# Patient Record
Sex: Female | Born: 1960 | Race: White | Hispanic: No | Marital: Single | Smoking: Current some day smoker
Health system: Southern US, Community
[De-identification: ages and names within clinical notes are randomized; demographics above are authoritative.]

## PROBLEM LIST (undated history)

## (undated) DIAGNOSIS — IMO0001 Reserved for inherently not codable concepts without codable children: Secondary | ICD-10-CM

## (undated) DIAGNOSIS — G40909 Epilepsy, unspecified, not intractable, without status epilepticus: Secondary | ICD-10-CM

## (undated) DIAGNOSIS — N201 Calculus of ureter: Secondary | ICD-10-CM

## (undated) DIAGNOSIS — Z87440 Personal history of urinary (tract) infections: Secondary | ICD-10-CM

## (undated) DIAGNOSIS — Z87448 Personal history of other diseases of urinary system: Secondary | ICD-10-CM

## (undated) DIAGNOSIS — E785 Hyperlipidemia, unspecified: Secondary | ICD-10-CM

## (undated) DIAGNOSIS — Z87442 Personal history of urinary calculi: Secondary | ICD-10-CM

## (undated) DIAGNOSIS — G40409 Other generalized epilepsy and epileptic syndromes, not intractable, without status epilepticus: Secondary | ICD-10-CM

## (undated) DIAGNOSIS — K219 Gastro-esophageal reflux disease without esophagitis: Secondary | ICD-10-CM

## (undated) HISTORY — PX: TONSILLECTOMY AND ADENOIDECTOMY: SUR1326

## (undated) HISTORY — DX: Gastro-esophageal reflux disease without esophagitis: K21.9

## (undated) HISTORY — DX: Hyperlipidemia, unspecified: E78.5

## (undated) HISTORY — PX: APPENDECTOMY: SHX54

---
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
## 2000-03-16 ENCOUNTER — Other Ambulatory Visit: Admission: RE | Admit: 2000-03-16 | Discharge: 2000-03-16 | Payer: Self-pay | Admitting: Family Medicine

## 2001-05-02 ENCOUNTER — Other Ambulatory Visit: Admission: RE | Admit: 2001-05-02 | Discharge: 2001-05-02 | Payer: Self-pay | Admitting: Family Medicine

## 2002-05-08 ENCOUNTER — Other Ambulatory Visit: Admission: RE | Admit: 2002-05-08 | Discharge: 2002-05-08 | Payer: Self-pay | Admitting: Family Medicine

## 2003-06-13 ENCOUNTER — Other Ambulatory Visit: Admission: RE | Admit: 2003-06-13 | Discharge: 2003-06-13 | Payer: Self-pay | Admitting: Family Medicine

## 2004-07-30 ENCOUNTER — Ambulatory Visit (HOSPITAL_COMMUNITY): Admission: RE | Admit: 2004-07-30 | Discharge: 2004-07-30 | Payer: Self-pay | Admitting: Obstetrics

## 2005-02-04 ENCOUNTER — Ambulatory Visit (HOSPITAL_COMMUNITY): Admission: RE | Admit: 2005-02-04 | Discharge: 2005-02-04 | Payer: Self-pay | Admitting: Obstetrics

## 2007-09-06 ENCOUNTER — Inpatient Hospital Stay (HOSPITAL_COMMUNITY): Admission: EM | Admit: 2007-09-06 | Discharge: 2007-09-11 | Payer: Self-pay | Admitting: Emergency Medicine

## 2007-09-10 ENCOUNTER — Ambulatory Visit: Payer: Self-pay | Admitting: Gastroenterology

## 2007-09-28 ENCOUNTER — Ambulatory Visit (HOSPITAL_COMMUNITY): Admission: RE | Admit: 2007-09-28 | Discharge: 2007-09-28 | Payer: Self-pay | Admitting: Family Medicine

## 2007-11-19 ENCOUNTER — Ambulatory Visit: Payer: Self-pay | Admitting: Gastroenterology

## 2007-11-21 ENCOUNTER — Ambulatory Visit: Payer: Self-pay | Admitting: Gastroenterology

## 2008-09-07 DIAGNOSIS — D649 Anemia, unspecified: Secondary | ICD-10-CM

## 2009-05-16 ENCOUNTER — Emergency Department (HOSPITAL_COMMUNITY): Admission: EM | Admit: 2009-05-16 | Discharge: 2009-05-16 | Payer: Self-pay | Admitting: Emergency Medicine

## 2009-07-20 ENCOUNTER — Ambulatory Visit (HOSPITAL_COMMUNITY): Admission: RE | Admit: 2009-07-20 | Discharge: 2009-07-20 | Payer: Self-pay | Admitting: Family Medicine

## 2009-09-15 ENCOUNTER — Ambulatory Visit: Payer: Self-pay | Admitting: Gastroenterology

## 2009-09-15 DIAGNOSIS — R197 Diarrhea, unspecified: Secondary | ICD-10-CM

## 2009-09-15 LAB — CONVERTED CEMR LAB
ALT: 15 units/L (ref 0–35)
AST: 20 units/L (ref 0–37)
Albumin: 4.1 g/dL (ref 3.5–5.2)
Alkaline Phosphatase: 66 units/L (ref 39–117)
BUN: 3 mg/dL — ABNORMAL LOW (ref 6–23)
Basophils Absolute: 0 10*3/uL (ref 0.0–0.1)
Basophils Relative: 0.7 % (ref 0.0–3.0)
CO2: 27 meq/L (ref 19–32)
CRP, High Sensitivity: 0.5 (ref 0.00–5.00)
Calcium: 9 mg/dL (ref 8.4–10.5)
Chloride: 101 meq/L (ref 96–112)
Creatinine, Ser: 0.5 mg/dL (ref 0.4–1.2)
Eosinophils Absolute: 0 10*3/uL (ref 0.0–0.7)
Eosinophils Relative: 0.7 % (ref 0.0–5.0)
GFR calc non Af Amer: 139.59 mL/min (ref >60)
Glucose, Bld: 90 mg/dL (ref 70–99)
HCT: 39.6 % (ref 36.0–46.0)
Hemoglobin: 13.6 g/dL (ref 12.0–15.0)
Lymphocytes Relative: 56.1 % — ABNORMAL HIGH (ref 12.0–46.0)
Lymphs Abs: 2.2 10*3/uL (ref 0.7–4.0)
MCHC: 34.4 g/dL (ref 30.0–36.0)
MCV: 103.5 fL — ABNORMAL HIGH (ref 78.0–100.0)
Monocytes Absolute: 0.3 10*3/uL (ref 0.1–1.0)
Monocytes Relative: 9 % (ref 3.0–12.0)
Neutro Abs: 1.3 10*3/uL — ABNORMAL LOW (ref 1.4–7.7)
Neutrophils Relative %: 33.5 % — ABNORMAL LOW (ref 43.0–77.0)
Platelets: 237 10*3/uL (ref 150.0–400.0)
Potassium: 4.1 meq/L (ref 3.5–5.1)
RBC: 3.82 M/uL — ABNORMAL LOW (ref 3.87–5.11)
RDW: 12.9 % (ref 11.5–14.6)
Sodium: 136 meq/L (ref 135–145)
Tissue Transglutaminase Ab, IgA: 0.1 units (ref <7)
Total Bilirubin: 0.6 mg/dL (ref 0.3–1.2)
Total Protein: 6.9 g/dL (ref 6.0–8.3)
WBC: 3.8 10*3/uL — ABNORMAL LOW (ref 4.5–10.5)

## 2009-09-18 ENCOUNTER — Ambulatory Visit: Payer: Self-pay | Admitting: Gastroenterology

## 2009-09-24 ENCOUNTER — Encounter: Payer: Self-pay | Admitting: Gastroenterology

## 2009-10-19 ENCOUNTER — Ambulatory Visit: Payer: Self-pay | Admitting: Gastroenterology

## 2009-10-19 DIAGNOSIS — K219 Gastro-esophageal reflux disease without esophagitis: Secondary | ICD-10-CM

## 2009-11-30 ENCOUNTER — Ambulatory Visit: Payer: Self-pay | Admitting: Gastroenterology

## 2010-11-07 ENCOUNTER — Encounter: Payer: Self-pay | Admitting: Family Medicine

## 2010-11-07 ENCOUNTER — Encounter: Payer: Self-pay | Admitting: Internal Medicine

## 2010-11-16 NOTE — Assessment & Plan Note (Signed)
Summary: f,.u after procedure ////em   History of Present Illness Visit Type: follow up  Primary GI MD: Melvia Heaps MD [facility] Primary Provider: Rudi Heap, MD Requesting Provider: n/a Chief Complaint: F/u from procedures  History of Present Illness:   Ms. Zapanta has returned for follow up of her abdominal pain and diarrhea.  Colonoscopy was normal.  Celiac antibodies were absent.  Random biopsies of the colon did not demonstrate any abnormalities.  With dietary discretion her symptoms have improved.  Clearly certain foods will trigger GI problems including diarrhea and abdominal pain.  She also has reflux which is fairly well controlled with Nexium.   GI Review of Systems      Denies abdominal pain, acid reflux, belching, bloating, chest pain, dysphagia with liquids, dysphagia with solids, heartburn, loss of appetite, nausea, vomiting, vomiting blood, weight loss, and  weight gain.        Denies anal fissure, black tarry stools, change in bowel habit, constipation, diarrhea, diverticulosis, fecal incontinence, heme positive stool, hemorrhoids, irritable bowel syndrome, jaundice, light color stool, liver problems, rectal bleeding, and  rectal pain.    Current Medications (verified): 1)  Depakote 500 Mg Tbec (Divalproex Sodium) .... Take 2 in The Morning, Take One At [facility] and 2 in The Evening. 2)  Tegretol 200 Mg Tabs (Carbamazepine) .... Take One and Half Tablet in The Mornings, Take One Tablet At [facility] and Take One and Half Tablet in The Evenings. 3)  Nexium 40 Mg Cpdr (Esomeprazole Magnesium) .... Once Daily 4)  Zonisamide 100 Mg Caps (Zonisamide) .... Once Daily  Allergies (verified): 1)  ! * Topomax 2)  ! * Lamisil  Past History:  Past Medical History: Reviewed history from 09/15/2009 and no changes required. Hyperlipidemia Kidney Stones Seizures  Past Surgical History: Reviewed history from 09/07/2008 and no changes required. APPENDECTOMY CERVICAL  FUSION CHOLECYSTECTOMY BILATERAL TUBAL LIGATION UNILATERAL OOPHORECTOMY  Family History: Family History of Prostate Cancer:GF Family History of Diabetes: Mother Family History of Heart Disease: Mother No FH of Colon Cancer:  Social History: Reviewed history from 09/15/2009 and no changes required. Occupation: Disabled Patient currently smokes.  Alcohol Use - yes Daily Caffeine Use -3 Illicit Drug Use - no  Review of Systems  The patient denies allergy/sinus, anemia, anxiety-new, arthritis/joint pain, back pain, blood in urine, breast changes/lumps, change in vision, confusion, cough, coughing up blood, depression-new, fainting, fatigue, fever, headaches-new, hearing problems, heart murmur, heart rhythm changes, itching, menstrual pain, muscle pains/cramps, night sweats, nosebleeds, pregnancy symptoms, shortness of breath, skin rash, sleeping problems, sore throat, swelling of feet/legs, swollen lymph glands, thirst - excessive , urination - excessive , urination changes/pain, urine leakage, vision changes, and voice change.    Vital Signs:  Patient profile:   50 year old female Height:      65 inches Weight:      153 pounds BMI:     25.55 BSA:     1.77 Pulse rate:   72 / minute Pulse rhythm:   regular BP sitting:   122 / 80  (left arm) Cuff size:   regular  Vitals Entered By: Ok Anis CMA (October 19, 2009 3:09 PM)   Impression & Recommendations:  Problem # 1:  DIARRHEA (ICD-787.91) Symptoms are most likely due to diarrhea-predominant IBS.  Medications may be contributing.  Recommendations #1 hyomax p.r.n. for pain #2 fiber supplementation #3 patient was instructed to take a dietary history when she has particularly symptomatic days  Problem # 2:  ESOPHAGEAL REFLUX (ICD-530.81)  Symptoms are well-controlled Nexium.  Plan to continue with the same.  Patient Instructions: 1)  CC Dr. Vernon Prey Prescriptions: HYOMAX-SL 0.125 MG SUBL (HYOSCYAMINE SULFATE) take 2  tabs sublingual q.4 h. p.r.n. abdominal pain  #15 x 2   Entered and Authorized by:   Louis Meckel MD   Signed by:   Louis Meckel MD on 10/19/2009   Method used:   Electronically to        [facility] [phone]* (retail)       [address]       [place], [place]  [address]       Ph: [phone] or [phone]       Fax: [phone]   RxID:   [id number]

## 2010-11-16 NOTE — Assessment & Plan Note (Signed)
Summary: F/U APPT...LSW.   History of Present Illness Visit Type: Follow-up Visit Primary GI MD: Melvia Heaps MD [facility] Primary Provider: Rudi Heap, MD Requesting Provider: n/a Chief Complaint: follow up diarrhea and abdominal pain, pt states she is feeling better.  pt is using Beano with fiber to reduce bloating form fiber History of Present Illness:   Ms. Covell has returned for followup of her diarrhea.  On a regimen of fiber and beano her symptoms are well-controlled.  There is certain foods that triggers symptoms including mayonnaise and salads.  With dietary discretion her symptoms are well-controlled.   GI Review of Systems      Denies abdominal pain, acid reflux, belching, bloating, chest pain, dysphagia with liquids, dysphagia with solids, heartburn, loss of appetite, nausea, vomiting, vomiting blood, weight loss, and  weight gain.        Denies anal fissure, black tarry stools, change in bowel habit, constipation, diarrhea, diverticulosis, fecal incontinence, heme positive stool, hemorrhoids, irritable bowel syndrome, jaundice, light color stool, liver problems, rectal bleeding, and  rectal pain.    Current Medications (verified): 1)  Depakote 500 Mg Tbec (Divalproex Sodium) .... Take 2 in The Morning, Take One At [facility] and 2 in The Evening. 2)  Tegretol 200 Mg Tabs (Carbamazepine) .... Take One and Half Tablet in The Mornings, Take One Tablet At [facility] and Take One and Half Tablet in The Evenings. 3)  Nexium 40 Mg Cpdr (Esomeprazole Magnesium) .... Once Daily 4)  Zonisamide 100 Mg Caps (Zonisamide) .... Once Daily 5)  Hyomax-Sl 0.125 Mg Subl (Hyoscyamine Sulfate) .... Take 2 Tabs Under [place] Every 4 Hours As Needed For Abdominal Pain 6)  Beano  Tabs (Alpha-D-Galactosidase) .... Take With Meals 7)  Metamucil 0.52 Gm Caps (Psyllium) .... Take 1 Capsule By Mouth Two Times A Day 8)  Simvastatin 40 Mg Tabs (Simvastatin) .... Take 1 Tablet By Mouth Once A Day 9)  Fish Oil 1000 Mg  Caps (Omega-3 Fatty Acids) .... Take 1 Cap With Meals 10)  Red Yeast Rice 600 Mg Tabs (Red Yeast Rice Extract) .... Take 21 Tablet With Meals 11)  Flax Seed Oil 1000 Mg Caps (Flaxseed (Linseed)) .... Take 1 Cap With Meals 12)  Vitamin D 1000 Unit Tabs (Cholecalciferol) .... Take 1 Tablet By Mouth Once A Day  Allergies: 1)  ! Topamax (Topiramate) 2)  ! Lamisil (Terbinafine Hcl)  Past History:  Past Medical History: Hyperlipidemia Kidney Stones Seizures GERD  Past Surgical History: Reviewed history from 09/07/2008 and no changes required. APPENDECTOMY CERVICAL FUSION CHOLECYSTECTOMY BILATERAL TUBAL LIGATION UNILATERAL OOPHORECTOMY  Family History: Reviewed history from 10/19/2009 and no changes required. Family History of Prostate Cancer:GF Family History of Diabetes: Mother Family History of Heart Disease: Mother No FH of Colon Cancer:  Social History: Reviewed history from 09/15/2009 and no changes required. Occupation: Disabled Patient currently smokes.  Alcohol Use - yes Daily Caffeine Use -3 Illicit Drug Use - no  Review of Systems  The patient denies allergy/sinus, anemia, anxiety-new, arthritis/joint pain, back pain, blood in urine, breast changes/lumps, confusion, cough, coughing up blood, depression-new, fainting, fatigue, fever, headaches-new, hearing problems, heart murmur, heart rhythm changes, itching, menstrual pain, muscle pains/cramps, night sweats, nosebleeds, pregnancy symptoms, shortness of breath, skin rash, sleeping problems, sore throat, swelling of feet/legs, swollen lymph glands, thirst - excessive, urination - excessive, urination changes/pain, urine leakage, vision changes, and voice change.    Vital Signs:  Patient profile:   50 year old female Height:  65 inches Weight:      153 pounds BMI:     25.55 Pulse rate:   76 / minute Pulse rhythm:   regular BP sitting:   110 / 70  (left arm) Cuff size:   regular  Vitals Entered By:  Francee Piccolo CMA Duncan Dull) (November 30, 2009 10:02 AM)   Impression & Recommendations:  Problem # 1:  DIARRHEA (ICD-787.91) Assessment Improved The patient has IBS and also a mouth chest or foods.  Recommendations #1 continue fiber supplementation and beano while watching her diet  Patient Instructions: 1)  CC Dr. Vernon Prey

## 2010-11-26 ENCOUNTER — Other Ambulatory Visit: Payer: Self-pay | Admitting: Family Medicine

## 2010-11-26 DIAGNOSIS — L989 Disorder of the skin and subcutaneous tissue, unspecified: Secondary | ICD-10-CM

## 2010-12-01 ENCOUNTER — Ambulatory Visit (HOSPITAL_COMMUNITY)
Admission: RE | Admit: 2010-12-01 | Discharge: 2010-12-01 | Disposition: A | Discharge status: Home / Self Care | Payer: Medicaid Other | Source: Ambulatory Visit | Attending: Family Medicine | Admitting: Family Medicine

## 2010-12-01 DIAGNOSIS — R221 Localized swelling, mass and lump, neck: Secondary | ICD-10-CM | POA: Insufficient documentation

## 2010-12-01 DIAGNOSIS — R220 Localized swelling, mass and lump, head: Secondary | ICD-10-CM | POA: Insufficient documentation

## 2010-12-01 DIAGNOSIS — D170 Benign lipomatous neoplasm of skin and subcutaneous tissue of head, face and neck: Secondary | ICD-10-CM | POA: Insufficient documentation

## 2010-12-01 DIAGNOSIS — L989 Disorder of the skin and subcutaneous tissue, unspecified: Secondary | ICD-10-CM

## 2011-03-01 NOTE — Discharge Summary (Signed)
NAME:  Savannah Shaw, Savannah Shaw                  ACCOUNT NO.:  [id number]   MEDICAL RECORD NO.:  [id number]          PATIENT TYPE:  INP   LOCATION:  A328                          FACILITY:  [facility]   PHYSICIAN:  Marcello Moores, MD   DATE OF BIRTH:  Feb 27, 1961   DATE OF ADMISSION:  09/06/2007  DATE OF DISCHARGE:  11/25/2008LH                               DISCHARGE SUMMARY   PRIMARY CARE PHYSICIAN:  Dr. Gennette Pac at [facility].   DISCHARGE DIAGNOSES:  1. Pyelonephritis/nephritis infectious versus noninfectious,      resolving.  Needs follow up by CAT scan in 1 month.  2. Seizure disorder.  No episode of seizure during admission.  She      will have followup with Dr. Gerilyn Pilgrim in 1 month.  3. Anemia, unknown origin.  Stool guaiac is negative.  She was      evaluated by gastrointestinal and she can have outpatient followup      with Dr. Cira Servant and Dr. Jena Gauss.  4. Altered mental status, resolved probably false.  5. Small left lower pole of left kidney urolithiasis.  Probably will      be removed spontaneously and it will be reaccessed with CAT scan of      the kidney in 1 month.  This plan was detailed with the patient as      was her niece which was here.   DISCHARGE MEDICATIONS:  1. Carbamazepine 300 mg in a.m., 300 mg at p.m. and 200 mg at noon.  2. Zonisamide 100 mg daily to be tapered to 50 mg and stopped later on      by Dr. Gerilyn Pilgrim.  3. Depakote 1000 mg in a.m. and 1000 mg in p.m. and 500 mg at noon.  4. Darvocet 100/650 every 6 hours p.r.n.  5. Levaquin 500 mg p.o. day x7 days.   LABORATORY DATA AND X-RAY FINDINGS:  CAT scan of the abdomen and pelvic  which showed enlarged and edematous kidneys with a few small stones in  the lower pole of the left kidney.  No sign of obstruction and the  finding was consistent with nephritis, infectious versus noninfectious.  A small amount of fluid in the pelvis and small density in the right  side of the pelvis as well.   Ultrasound of the kidneys showed left  kidney larger than right, but there is no hydronephrosis or mass.  CT of  the head without contrast was negative.  LP was done by neurologist and  it was negative for any meningitis.   CONSULTATIONS:  1. Neurologist for altered mental status and fever and seizures.  2. Gastroenterology for abdominal pain and anemia.   HOSPITAL COURSE:  Savannah Shaw is a 50 year old female patient with long  history of seizure disorder on three high dose antiepileptic  medications.  She presented with fever and urinary symptoms and was  admitted for pyelonephritis and she was put on Rocephin and she has  fever and history of questionable seizure episodes and altered mental  status, to some extent confusion.  Neurologist was consulted and  LP was  done which was negative for meningitis.  Her seizure was managed as  before and she remained very stable and she responded well to Rocephin.  Fever subsided, leukocytosis also subsided and there is no pain.  She  has low hemoglobin/hematocrit and the stool guaiac is negative.  She was  evaluated by GI and she needs to have followup with them in 1 month.   DISCHARGE PHYSICAL EXAMINATION:  VITAL SIGNS:  Temperature 97, pulse is  67 and respiratory rate 20, systolic blood pressure 110 and diastolics  is 68.  CHEST:  Good air entry.  NECK:  Supple.  CARDIAC: S1, S2 and regular.  ABDOMEN:  Soft.  EXTREMITIES:  No pedal edema.  CNS:  She is alert and fairly oriented.   DISCHARGE LABORATORY DATA AND X-RAY FINDINGS:  White blood cells 4.9,  hemoglobin 8.8 and hematocrit 26, platelet count 222.  Chemistry within  normal range with sodium 140, potassium 3.7, BUN and creatinine also  within normal range.  Stool guaiac is negative x3 and the third one is  pending.  Iron studies with iron 36, total iron binding capacity is 155.  Ferritin level is 202.  Folic acid was within normal range and vitamin  B12 also within normal range.    DISPOSITION:  She will be discharged today to home.   FOLLOW UP:  She needs to have followup with her PMD in 1 week.  Followup  CAT scan of the pelvic area, specifically kidneys to see the type of  nephritis whether it has resolved or not and to see the stone and  whether it has spontaneously removed.  She needs followup with GI and  needs her hemoglobin to be monitored by her PMD and needs further  evaluation with GI as well.      Marcello Moores, MD  Electronically Signed     MT/MEDQ  D:  09/11/2007  T:  09/11/2007  Job:  [id number]   cc:   Gennette Pac, M.D.  [facility]

## 2011-03-01 NOTE — H&P (Signed)
NAME:  Savannah Shaw, Savannah Shaw                  ACCOUNT NO.:  [id number]   MEDICAL RECORD NO.:  [id number]          PATIENT TYPE:  INP   LOCATION:  A322                          FACILITY:  [facility]   PHYSICIAN:  Marcello Moores, MD   DATE OF BIRTH:  07/25/1961   DATE OF ADMISSION:  09/06/2007  DATE OF DISCHARGE:  LH                              HISTORY & PHYSICAL   PMD:  She goes to [facility] at [place].   CHIEF COMPLAINT:  Fever, flank pain, nausea and questionable seizure of  three days' duration.   HISTORY OF PRESENT ILLNESS:  Ms. Kiesel is 50 year old female patient with  history of seizure disorder for long time for several years.  She was  brought by EMS from [facility] for the above complaint.  As per the patient  and the patient's boyfriend stated that she had flank pain and fever 3  days ago and she went to her primary physician and she was given  antibiotic and went back home and she felt a little bit better but she  started to have fever and she became a little bit confused and he  suspects that she had episode of seizure two nights, but he was not sure  will because he found her once in the bathroom confused and second he  saw her she was shaking.   Dictation ended at this point.      Marcello Moores, MD  Electronically Signed     MT/MEDQ  D:  09/06/2007  T:  09/07/2007  Job:  [id number]

## 2011-03-01 NOTE — H&P (Signed)
NAME:  Savannah Shaw, Savannah Shaw                  ACCOUNT NO.:  [id number]   MEDICAL RECORD NO.:  [id number]          PATIENT TYPE:  INP   LOCATION:  A328                          FACILITY:  [facility]   PHYSICIAN:  Marcello Moores, MD   DATE OF BIRTH:  [birth year]   DATE OF ADMISSION:  09/06/2007  DATE OF DISCHARGE:  LH                              HISTORY & PHYSICAL   PRIMARY CARE PHYSICIAN:  [facility].   CHIEF COMPLAINT:  Fever, flank pain and nausea with questionable seizure  of three days' duration.   HISTORY OF PRESENT ILLNESS:  She is 50 year old female patient with  history of seizure disorder for several years.  She is brought today by  EMS with the above complaints as per the patient's boyfriend who is at  her bedside.  The patient stated that she had fever and flank pain 3  days ago and she visited her physician at [facility].  She received some  antibiotic and she was feeling a little bit better to have again fever,  flank pain, with nausea and loss of appetite.  The patient and her  boyfriend stated that they think she passed a stone in the last 2 days  and he feels that she had episodes of seizure also two nights as per the  boyfriend.  He stated that once he found her in the bathroom confused  and the second night he saw her shaking and he thinks that was her  seizure.  The patient stated that she was taking her medications  properly and the boyfriend feels that she is more confused also the last  few days.  She denied any headache.  Her flank pain is bilateral and she  admitted to some frequency and she was told by her PMD she has urinary  tract infection; otherwise, she has no cough, no chest pain or any  palpitations. And she was starting to have watery diarrhea the last few  hours as well.   REVIEW OF SYSTEMS:  The 10-point review of systems is as dictated in the  HPI.   ALLERGIES:  No known drug allergies.   SOCIAL HISTORY:  The patient is living currently  with her boyfriend and  she is not working.  She is on did disability for her seizure related.  She denies smoking or any alcohol use.   FAMILY HISTORY:  Noncontributory.   PAST MEDICAL HISTORY:  Seizure disorder.   HOME MEDICATIONS:  1. Depakote 500 mg 2 tablets in a.m. and 500 mg 1 tablet at noon and      500 mg 2 tablets in p.m.  2. Naprosyn 500 mg twice daily.  3. Darvocet 100/650 mg every 6 hours p.r.n.  4. Zonisamide 100 mg twice daily.  5. Carbamazepine 200 mg 1-1/2 tablet in a.m. and 1 tablet at noon 1-      1/2 tablet at p.m.  6. Nitrofurantoin 100 mg twice daily for the last 3 days.  7. Loperamide 2 mg p.r.n.   PHYSICAL EXAMINATION:  GENERAL:  The patient is lying on the bed without  any cardiopulmonary distress but she wants to go to bathroom for a  moment.  VITAL SIGNS:  Temperature in the emergency room was 100.5,  blood pressure 112/67, pulse is 100, respiratory rate 18, saturation  100%.  HEENT:  She has pink conjunctivae.  Nonicteric sclerae.  Pupils are  equal and reactive.  NECK:  Supple.  There are no any signs of meningeal irritation.  CHEST:  She has good air entry bilaterally.  CARDIOVASCULAR:  S1 and S2 well-heard,  irregular.  ABDOMEN:  Soft.  Some mild tenderness on the flank area bilaterally and  hyperactive bowel sounds.  EXTREMITIES:  She has no pedal edema.  CNS:  She is alert but slow to respond and trying to search for words.  I am not sure of her baseline.   LABORATORY DATA:  White blood cells 15, hemoglobin is 11.9, hematocrit  35.  Platelet count 101.  Chemistry shows sodium is 133, potassium is  3.9, chloride is 102, glucose 122, BUN 15, creatinine is 0.7.  Urinalysis showed white blood cells 11-20; otherwise negative.  Valproic  acid level was 28 and carbamazepine level was 9.4   ASSESSMENT:  1. Urinary tract infection with possible pyelonephritis.  The patient      has fever and flank pain bilaterally and sign of urinary tract       infection.  She was on nitrofurantoin.  Will put her on Rocephin IV      and will send urine culture as well as blood culture and will do      ultrasound of the kidney.  2. Altered mental status with confusion, questionable seizure      episodes.  Will restart her seizure medications as it was and will      monitor for seizure.  Currently CAT scan of the brain without      contrast is negative for any mass or any abnormality.  Will consult      also neurologist for further management.  3. Acute diarrhea.  Will give her IV fluid and we will send stool exam      for Clostridium difficile also.  Will put her on DVT as well as GI      prophylaxis.      Marcello Moores, MD  Electronically Signed     MT/MEDQ  D:  09/06/2007  T:  09/07/2007  Job:  [id number]

## 2011-03-01 NOTE — Assessment & Plan Note (Signed)
NAME:  Savannah Shaw, Savannah Shaw                   CHART#:  [id number]   DATE:  11/19/2007                       DOB:  [birth year]   CHIEF COMPLAINT:  Followup normocytic anemia.   SUBJECTIVE:  The patient is a 50 year old female who was admitted to  [facility] and we were consulted on 09/10/2007.  She was  admitted with nephritis and mental status changes.  She was found to be  anemic.  Her hemoglobin went from 11.2 down to 8.7.  She had a normal  folate, a low iron at 36, a TIBC of 159% saturation, UIBC 123, ferritin  202 and B12 447.  She was fecal occult blood test negative x2.  She  denies any abdominal pain since discharge, she denies any fatigue.  She  says that overall she is doing very well.  She denies any nausea,  vomiting, heartburn, indigestion, dysphagia, odynophagia, anorexia or  early satiety, denies any rectal bleeding or melena.   CURRENT MEDICATIONS:  See the updated list from 11/19/2007.   ALLERGIES:  No known drug allergies.   OBJECTIVE:  VITAL SIGNS: Weight 150 pounds, height 65 inches, temp 98.5,  blood pressure 120/82 and pulse 72.  GENERAL:  The patient is a well-developed, well-nourished Caucasian  female in no acute distress.HEENT:  Sclerae clear, non injected.  Conjunctivae pink.  Oropharynx pink and moist without any lesions.CHEST:  Heart regular rate and rhythm with normal S1, S2 without murmurs,  clicks, rubs, or gallops.ABDOMEN:  Positive bowel sounds x4.  No bruits  auscultated, soft, nontender, nondistended without palpable masses or  hepatosplenomegaly.  No rebound, tenderness or guarding.EXTREMITIES:  Without clubbing or edema.   ASSESSMENT:  The patient is a 50 year old female with anemia with  hospitalization approximately 6 weeks ago.  There is no evidence of iron  deficiency.  No gastrointestinal  complaints at this time.  Would check a CBC and Hemoccult stools x3.  Cannot rule out early iron deficiency, but will follow up with CBC and  determine  further workup pending results.       Lorenza Burton, N.P.  Electronically Signed     Kassie Mends, M.D.  Electronically Signed    KJ/MEDQ  D:  11/19/2007  T:  11/20/2007  Job:  [id number]

## 2011-03-01 NOTE — Consult Note (Signed)
NAME:  Savannah Shaw, BREWTON                  ACCOUNT NO.:  [id number]   MEDICAL RECORD NO.:  [id number]          PATIENT TYPE:  INP   LOCATION:  A328                          FACILITY:  [facility]   PHYSICIAN:  Kofi A. Gerilyn Pilgrim, M.D. DATE OF BIRTH:  1961/01/16   DATE OF CONSULTATION:  DATE OF DISCHARGE:                                 CONSULTATION   REASON FOR CONSULTATION:  Altered mental status.   The patient is a 50 year old white female who presents with abdominal  pain, not feeling well over the last few days.  She believes she  apparently passed a kidney stone and went to see her doctor who gave her  medication for this.  She has had significant anorexia and drowsiness,  apparently sleeping for most of the day before presenting to the  hospital.  She had 2 convulsive seizures and a severe headache which  resulted in the patient coming to the hospital.  Again, she also had  ulceration of mentation.  She has a baseline history of seizures but has  been seizure free for close to 3 years on her current anti-epileptic  medications.  She apparently has had seizures since the age of 60.  There is no family history of seizures.  She reports being weaned off  her medication because of her seizures were well controlled but  apparently had a seizure at that time which was about 3 years ago.  She  has been on her current medications without any seizures.  The patient  may not have been taking her medication over the last couple of days  because of feeling sick including having significant nausea and loose  bowels.  Again, she had a severe headache yesterday and reports not being a  headache person at baseline.  She continues to have significant  abdominal pain involving the groin region, the right and left lower  quadrant regions, and also the right flank region.  She does not report  having a headache today.  She also complains of urinary frequency, dark-  colored urine, and dysuria.   PAST MEDICAL  HISTORY:  Significant for epilepsy.   PAST SURGICAL HISTORY:  1. Appendectomy.  2. Cervical fusion.  3. Cholecystectomy.   SOCIAL HISTORY:  Not employed.  No history of tobacco or illicit drug  use.  She has a supportive aunt.  She has 2 children.   ALLERGIES:  None known.   ADMISSION MEDICATIONS:  1. Carbamazepine 200 mg, one half b.i.d. and one at noon.  2. Naproxen 500 mg p.r.n.  3. Nitrofurantoin 100 mg b.i.d.  4. Aciphex.  5. Generic Depakote 500 mg, two b.i.d. and one at noon.  6. Zonisamide 100 mg b.i.d.   FAMILY HISTORY:  No history of seizures.   PHYSICAL EXAMINATION:  GENERAL:  Shows a very pleasant lady in no acute  distress.  VITAL SIGNS:  T-max was 103.9, latest temperature 98, pulse 99,  respirations 24, blood pressure 87/54.  HEENT:  Neck is supple.  Head is atraumatic, normocephalic.  I see no  evidence of meningismus.  Negative Kernig sign.  ABDOMEN:  Soft.  EXTREMITIES:  No significant edema.  She does have some varicosities.  NEUROLOGIC:  Mentation:  She is awake and alert.  She converses well.  Speech, language, and cognition are intact.  Cranial nerves evaluation:  Pupils are 4-mm and reactive.  Visual fields are intact.  Extraocular  movements are intact.  Facial muscle strength is symmetric.  Tongue is  midline.  Uvula midline.  Shoulder shrug is normal.  Motor examination  shows normal bulk and tone.  She does have some proximal muscle weakness  both upper extremities and the legs at 4/5.  No pronator drift.  Reflexes are preserved.  Sensation is normal to light touch and  temperature.   SUPPORTIVE DATA:  Renal ultrasound shows left kidney is larger than the  right, no evidence of masses or hydronephrosis.  CT scan is negative.  WBC 15, hemoglobin 11, platelet count 201.  Urinalysis positive ketones,  WBC 11-20, a few epithelial cells, nitrites negative.  Sodium 133,  potassium 3.9, chloride 102, CO2 29, BUN 15 , creatinine 0.7, glucose  123.  PH  7.44 with pCO2 28.  Alcohol level is undetected.  Carbamazepine  level 9.4.  Valproic acid level is 28.   ASSESSMENT:  1. Resolving encephalopathy, likely multifactorial from seizures.  2. Headaches, fever, and breakthrough seizures.  I am still concerned      about the possibility of meningitis.  Her seizures have been      previously very well controlled.  3. Epilepsy at baseline which appears to have been well controlled on      3 different types of medications.   RECOMMENDATIONS:  1. I think we should do a lumbar spinal tap to assess for possible      meningitis.  2. I think at some point in time, we can wean the patient off at least      one of her antiepileptic medications.  The zonisamide apparently it      was started the latest a few years ago and therefore, this may be      the one to wean off initially.  I will go ahead and reduce the dose      to 100 mg daily and subsequently reduce it to 50 and then      discontinue afterwards.      Kofi A. Gerilyn Pilgrim, M.D.  Electronically Signed     KAD/MEDQ  D:  09/07/2007  T:  09/07/2007  Job:  [id number]

## 2011-03-01 NOTE — Consult Note (Signed)
NAME:  Savannah Shaw, Savannah Shaw                  ACCOUNT NO.:  [id number]   MEDICAL RECORD NO.:  [id number]          PATIENT TYPE:  INP   LOCATION:  A328                          FACILITY:  [facility]   PHYSICIAN:  Kassie Mends, M.D.      DATE OF BIRTH:  23-Dec-1960   DATE OF CONSULTATION:  09/10/2007  DATE OF DISCHARGE:                                 CONSULTATION   REFERRING PHYSICIAN:  [facility].   REASON FOR CONSULTATION:  Anemia.   PRIMARY CARE PHYSICIAN:  [facility].   HISTORY OF PRESENT ILLNESS:  Savannah Shaw is a 50 year old Caucasian female.  She developed fever and flank pain last week.  She was diagnosed with a  kidney stone.  She then developed a urinary tract infection.  She has  history of epilepsy and had two seizures last week.  She then had a high  fever with temperature around 103.  She was having some abdominal pain,  flank pain, and change in mental status.  Therefore, she was brought to  the hospital.  She has been evaluated by Dr. Gerilyn Pilgrim.  She has had a  lumbar puncture.  He is managing some of her antiepileptic medicines.  She was found to have anemia with hemoglobin of 11.2 on November 20 when  she came in.  Her hemoglobin is now down to 8.7 today.  She denies any  rectal bleeding or melena.  She has had abdominal pain for one week with  a urinary tract infection.  She has noted some loose stools and urgency  but typically has 1-2 bowel movements a day.  The most she has ever had  is 4 loose stools.  She has history of GERD and has been on Aciphex 20  mg daily for quite some time.  She had a CT of the abdomen and pelvis  without contrast which showed enlarged edematous kidneys bilaterally,  left kidney with few small stones and nephritis and a small amount of  free pelvic fluid.  She had a head CT without contrast which was  negative.  She had a C. difficile which was negative.  She denies any  anorexia.  Her weight is down about 5 pounds in the last  week.   PAST MEDICAL AND SURGICAL HISTORY:  1. Appendectomy.  2. Cervical fusion.  3. Cholecystectomy.  4. Epileptic seizures diagnosed at age 50.  Her last seizure was 2-3      years ago prior to this illness.  5. Bilateral tubal ligation.  6. Unilateral oophorectomy.   MEDICATIONS PRIOR TO ADMISSION:  1. Depakote 500 mg 2 in the morning, 1 at noon,  2 in the evening.  2. Tegretol 200 mg 1-1/2 in the morning, 1 at noon, and 1-1/2 in the      evening.  3. Loperamide 2 mg p.r.n.  4. Naproxen 500 mg b.i.d.  5. Darvocet p.r.n.  6. Aciphex 20 mg daily.  7. Zonisamide 100 mg b.i.d.  8. Macrobid b.i.d.   ALLERGIES:  No known drug allergies.   FAMILY HISTORY:  No known family  history of colorectal carcinoma or  other chronic GI problems.   SOCIAL HISTORY:  Savannah Shaw is divorced.  She lives with her boyfriend.  She has two grown healthy daughters.  She is a grandmother.  She denies  any tobacco, alcohol, or drug use.   REVIEW OF SYSTEMS:  See HPI.  GU:  She does complain of increased  urinary frequency, dark urine, and dysuria.   PHYSICAL EXAMINATION:  VITAL SIGNS:  Temperature 97.8, pulse 79,  respirations 20, blood pressure 108/70.  GENERAL:  Savannah Shaw is a well-developed, well-nourished Caucasian female  in no acute distress.  HEENT:  Sclerae clear, nonicteric.  Oropharynx pink and moist without  lesions.  Teeth are in poor repair.  NECK:  Supple without adenopathy or thyromegaly.  HEART:  Regular rate and rhythm.  Normal S1, S2, without murmurs,  clicks, rubs, or gallops.  LUNGS:  Clear to auscultation bilaterally.  ABDOMEN:  Positive bowel sounds x4.  No bruits auscultated. Soft,  nontender, nondistended.  No hepatosplenomegaly.  No rebound or  guarding.  RECTAL:  No external lesions visualized.  Good sphincter tone.  Internal  mass palpated, small amount of light brown stool obtained from the vault  which is Hemoccult negative.  EXTREMITIES:  Without clubbing or edema  bilaterally.   LABORATORY DATA:  WBC 5.4, hemoglobin 8.7, hematocrit 25.4, platelets  177, MCV 96.7.  Calcium 8.2, sodium 131, potassium 3.4, chloride 109,  CO2 26, BUN 1, creatinine 0.46, glucose 120.  Urine HCG negative.  Tegretol was within normal limits.  Depakene levels were low at 28.  Her  alcohol level was less than 5.  Urinalysis positive for bilirubin,  ketones, and trace blood.   IMPRESSION:  Savannah Shaw is a 50 year old Caucasian female with renal  lithiasis and urinary tract infection which began last week.  She had  febrile illness and seizures and altered level of consciousness which  led to admission at [facility].  She was found to have  normocytic anemia once admitted.  Hemoglobin has dropped from 11.2 to  8.7.  She is found to have bilateral nephritis.  She has also had a  lumbar puncture this admission and is being followed by Dr. Gerilyn Pilgrim.  There is no evidence of gross gastrointestinal bleeding.  She has  chronic gastroesophageal reflux disease well controlled on proton pump  inhibitor.  She has had a few loose stools since admission, and  Clostridium difficile has been negative.   PLAN:  1. Would follow up on anemia profile.  2. Hemoccult stools x3.  3. Continue Protonix 40 mg daily.  4. Would stop Lovenox and  Naproxen.  5. If evidence of hemoccult-positive stool or gastrointestinal      bleeding, would pursue further evaluation with EGD plus or minus      colonoscopy.  6. Further workup pending.   We would like to thank the [facility] for allowing [place] to  participate in the care of Savannah Shaw.   ADDENDUM:  OPV with KJ in 4-6 weeks.      Lorenza Burton, N.P.      Kassie Mends, M.D.  Electronically Signed    KJ/MEDQ  D:  09/10/2007  T:  09/10/2007  Job:  [id number]

## 2011-07-04 ENCOUNTER — Encounter: Payer: Self-pay | Admitting: Cardiology

## 2011-07-06 ENCOUNTER — Encounter: Payer: Self-pay | Admitting: Cardiology

## 2011-07-06 ENCOUNTER — Ambulatory Visit (INDEPENDENT_AMBULATORY_CARE_PROVIDER_SITE_OTHER): Payer: Medicaid Other | Admitting: Cardiology

## 2011-07-06 DIAGNOSIS — R609 Edema, unspecified: Secondary | ICD-10-CM | POA: Insufficient documentation

## 2011-07-06 DIAGNOSIS — E785 Hyperlipidemia, unspecified: Secondary | ICD-10-CM

## 2011-07-06 DIAGNOSIS — R0609 Other forms of dyspnea: Secondary | ICD-10-CM

## 2011-07-06 DIAGNOSIS — R0600 Dyspnea, unspecified: Secondary | ICD-10-CM | POA: Insufficient documentation

## 2011-07-06 NOTE — Assessment & Plan Note (Signed)
Despite the absence of physical findings I am concerned that this could be cardiac. I will start with an echocardiogram. I have prescribed compression stockings and discussed conservative management.

## 2011-07-06 NOTE — Assessment & Plan Note (Signed)
She recently had an LDL of 166 and her Lipitor was increased.  I reviewed the labs and I will defer to Monica Becton, MD

## 2011-07-06 NOTE — Patient Instructions (Addendum)
Your physician has requested that you have an echocardiogram. Echocardiography is a painless test that uses sound waves to create images of your heart. It provides your doctor with information about the size and shape of your heart and how well your heart's chambers and valves are working. This procedure takes approximately one hour. There are no restrictions for this procedure.  Your physician has requested that you have a lexiscan myoview. For further information please visit [email or web address] Please follow instruction sheet, as given.  Please wear compression daily.  The current medical regimen is effective;  continue present plan and medications.

## 2011-07-06 NOTE — Progress Notes (Signed)
HPI The patient presents for evaluation of edema and dyspnea.  She has no prior cardiac history. She does have significant cardiovascular risk factors. She has been experiencing lower extremities are very slow going on for over one year it is getting progressively worse. She avoids salt. He does not keep her feet elevated. She does have some DOE.  She does not exercise routinely although she tries to walk a couple of times per week.  She denies any chest pressure, neck or arm discomfort. She denies any resting shortness of breath, PND or orthopnea. She has had a 20 pound weight loss since she stopped smoking. She has had no fevers, cough or trauma.   Allergies  Allergen Reactions  . Topiramate     Current Outpatient Prescriptions  Medication Sig Dispense Refill  . atorvastatin (LIPITOR) 40 MG tablet Take 40 mg by mouth daily.        Marland Kitchen b complex vitamins tablet Take 1 tablet by mouth daily.        Marland Kitchen BLACK COHOSH PO Take by mouth.        . carbamazepine (TEGRETOL) 200 MG tablet Take 200 mg by mouth 4 (four) times daily.       . divalproex (DEPAKOTE) 500 MG EC tablet Take 1,000 mg by mouth 2 (two) times daily.       Marland Kitchen esomeprazole (NEXIUM) 40 MG capsule Take 40 mg by mouth daily.        . fish oil-omega-3 fatty acids 1000 MG capsule Take 1 capsule by mouth daily.        . hydrochlorothiazide (HYDRODIURIL) 25 MG tablet Take 25 mg by mouth daily.        Marland Kitchen zolpidem (AMBIEN) 10 MG tablet Take 10 mg by mouth at bedtime as needed.        . zonisamide (ZONEGRAN) 100 MG capsule Take 100 mg by mouth daily.          Past Medical History  Diagnosis Date  . Hyperlipidemia   . Kidney stones   . Seizure   . GERD (gastroesophageal reflux disease)     Past Surgical History  Procedure Date  . Appendectomy   . Cervical fusion   . Cholecystectomy   . Tubal ligation     Bilateral  . Bilateral oophorectomy     Family History  Problem Relation Age of Onset  . Diabetes Mother   . Heart disease  Mother   . Colon cancer Neg Hx   . Prostate cancer Other     History   Social History  . Marital Status: Single    Spouse Name: N/A    Number of Children: N/A  . Years of Education: N/A   Occupational History  . Disabled    Social History Main Topics  . Smoking status: Former Games developer  . Smokeless tobacco: Not on file  . Alcohol Use: Yes  . Drug Use: No  . Sexually Active: Not on file   Other Topics Concern  . Not on file   Social History Narrative   Daily Caffeine use - 3    ROS:  Positive for reflux, lower extremity swelling, joint pain.  Otherwise as stated in the HPI and negative for all other systems.   PHYSICAL EXAM BP 112/66  Pulse 65  Resp 16  Ht 5\' 5"  (1.651 m)  Wt 169 lb (76.658 kg)  BMI 28.12 kg/m2 GENERAL:  Well appearing HEENT:  Pupils equal round and reactive, fundi not visualized, oral mucosa  unremarkable NECK:  No jugular venous distention, waveform within normal limits, carotid upstroke brisk and symmetric, no bruits, no thyromegaly LYMPHATICS:  No cervical, inguinal adenopathy LUNGS:  Clear to auscultation bilaterally BACK:  No CVA tenderness CHEST:  Unremarkable HEART:  PMI not displaced or sustained,S1 and S2 within normal limits, no S3, no S4, no clicks, no rubs, no murmurs ABD:  Flat, positive bowel sounds normal in frequency in pitch, no bruits, no rebound, no guarding, no midline pulsatile mass, no hepatomegaly, no splenomegaly EXT:  2 plus pulses throughout, moderate edema, no cyanosis no clubbing SKIN:  No rashes no nodules NEURO:  Cranial nerves II through XII grossly intact, motor grossly intact throughout PSYCH:  Cognitively intact, oriented to person place and time   EKG:  Sinus rhythm, rate 67, axis within normal limits, intervals within normal limits, no acute ST-T wave changes.   ASSESSMENT AND PLAN

## 2011-07-06 NOTE — Assessment & Plan Note (Signed)
She has significant risk factors. She needs stress testing but said she could not walk on a treadmill. Therefore, she will have a [employer].

## 2011-07-20 ENCOUNTER — Ambulatory Visit (HOSPITAL_COMMUNITY): Payer: Medicaid Other | Attending: Cardiology

## 2011-07-20 ENCOUNTER — Ambulatory Visit (HOSPITAL_COMMUNITY): Payer: Medicaid Other | Attending: Cardiology | Admitting: Radiology

## 2011-07-20 DIAGNOSIS — R609 Edema, unspecified: Secondary | ICD-10-CM

## 2011-07-20 DIAGNOSIS — R0600 Dyspnea, unspecified: Secondary | ICD-10-CM

## 2011-07-20 DIAGNOSIS — Z87891 Personal history of nicotine dependence: Secondary | ICD-10-CM | POA: Insufficient documentation

## 2011-07-20 DIAGNOSIS — K219 Gastro-esophageal reflux disease without esophagitis: Secondary | ICD-10-CM | POA: Insufficient documentation

## 2011-07-20 DIAGNOSIS — R0989 Other specified symptoms and signs involving the circulatory and respiratory systems: Secondary | ICD-10-CM | POA: Insufficient documentation

## 2011-07-20 DIAGNOSIS — E785 Hyperlipidemia, unspecified: Secondary | ICD-10-CM | POA: Insufficient documentation

## 2011-07-20 DIAGNOSIS — R0609 Other forms of dyspnea: Secondary | ICD-10-CM

## 2011-07-20 DIAGNOSIS — R079 Chest pain, unspecified: Secondary | ICD-10-CM

## 2011-07-20 HISTORY — PX: TRANSTHORACIC ECHOCARDIOGRAM: SHX275

## 2011-07-20 HISTORY — PX: CARDIOVASCULAR STRESS TEST: SHX262

## 2011-07-20 MED ORDER — TECHNETIUM TC 99M TETROFOSMIN IV KIT
11.0000 | PACK | Freq: Once | INTRAVENOUS | Status: AC | PRN
  Administered 2011-07-20: 11 via INTRAVENOUS

## 2011-07-20 MED ORDER — TECHNETIUM TC 99M TETROFOSMIN IV KIT
33.0000 | PACK | Freq: Once | INTRAVENOUS | Status: AC | PRN
  Administered 2011-07-20: 33 via INTRAVENOUS

## 2011-07-20 MED ORDER — REGADENOSON 0.4 MG/5ML IV SOLN
0.4000 mg | Freq: Once | INTRAVENOUS | Status: AC
  Administered 2011-07-20: 0.4 mg via INTRAVENOUS

## 2011-07-20 NOTE — Progress Notes (Signed)
[facility] SITE 3 NUCLEAR MED [address] [place] [address] [phone]  Cardiology Nuclear Med Study  Savannah Shaw is a 50 y.o. female [id number] Nov 24, 1960   Nuclear Med Background Indication for Stress Test:  Evaluation for Ischemia History:  No previous documented CAD Cardiac Risk Factors: Family History - CAD, History of Smoking and Lipids  Symptoms:  Chest Pain (last episode of chest discomfort was yesterday), DOE and Fatigue   Nuclear Pre-Procedure Caffeine/Decaff Intake:  None NPO After: 6:00pm   Lungs:  Clear.  O2 SAT 100% on RA IV 0.9% NS with Angio Cath:  22g  IV Site: R Hand x 1, tolerated well IV Started by:  Irean Hong, RN  Chest Size (in):  36 Cup Size: B  Height: 5\' 5"  (1.651 m)  Weight:  170 lb (77.111 kg)  BMI:  Body mass index is 28.29 kg/(m^2). Tech Comments:  n/a    Nuclear Med Study 1 or 2 day study: 1 day  Stress Test Type:  Treadmill/Lexiscan  Reading MD: Kristeen Miss, MD  Order Authorizing Provider:  Rollene Rotunda, MD  Resting Radionuclide: Technetium 34m Tetrofosmin  Resting Radionuclide Dose: 11.0 mCi   Stress Radionuclide:  Technetium 55m Tetrofosmin  Stress Radionuclide Dose: 33.0 mCi           Stress Protocol Rest HR: 70 Stress HR: 121  Rest BP: 134/81 Stress BP: 162/82  Exercise Time (min): 2:00 METS: n/a   Predicted Max HR: 170 bpm % Max HR: 71.18 bpm Rate Pressure Product: 21308   Dose of Adenosine (mg):  n/a Dose of Lexiscan: 0.4 mg  Dose of Atropine (mg): n/a Dose of Dobutamine: n/a mcg/kg/min (at max HR)  Stress Test Technologist: Smiley Houseman, CMA-N  Nuclear Technologist:  Domenic Polite, CNMT     Rest Procedure:  Myocardial perfusion imaging was performed at rest 45 minutes following the intravenous administration of Technetium 17m Tetrofosmin.  Rest ECG: Minor nonspecific ST-T wave changes.  Stress Procedure:  The patient received IV Lexiscan 0.4 mg over 15-seconds with concurrent low  level exercise and then Technetium 36m Tetrofosmin was injected at 30-seconds while the patient continued walking one more minute.  There were marked ST-T wave changes with Lexiscan.  She denied any chest pain.  Quantitative spect images were obtained after a 45-minute delay.  Stress ECG: Significant ST abnormalities consistent with ischemia.  These changes were in the anterior lateral leads  QPS Raw Data Images:  Normal; no motion artifact; normal heart/lung ratio. Stress Images:  Normal homogeneous uptake in all areas of the myocardium. Rest Images:  Normal homogeneous uptake in all areas of the myocardium. Subtraction (SDS):  Normal Transient Ischemic Dilatation (Normal <1.22):  1.16 Lung/Heart Ratio (Normal <0.45):  0.35  Quantitative Gated Spect Images QGS EDV:  56 ml QGS ESV:  14 ml QGS cine images:  NL LV Function; NL Wall Motion QGS EF: 76%  Impression Exercise Capacity:  Lexiscan with low level exercise. BP Response:  Normal blood pressure response. Clinical Symptoms:  No chest pain. ECG Impression:  There were ECG changes with the Lexiscan that resolved shortly after the infusion was complete. Comparison with Prior Nuclear Study: No previous nuclear study performed  Overall Impression:  Normal stress nuclear study.  There is no evidence of ischemia.  The LV function is normal with an EF of 76 %,    [name], [name]., MD, [facility]    S

## 2011-07-26 LAB — DIFFERENTIAL
Band Neutrophils: 13 — ABNORMAL HIGH
Basophils Relative: 0
Basophils Relative: 0
Blasts: 0
Eosinophils Absolute: 0 — ABNORMAL LOW
Eosinophils Absolute: 0 — ABNORMAL LOW
Eosinophils Absolute: 0 — ABNORMAL LOW
Eosinophils Relative: 0
Eosinophils Relative: 0
Eosinophils Relative: 1
Lymphocytes Relative: 8 — ABNORMAL LOW
Lymphs Abs: 0.4 — ABNORMAL LOW
Lymphs Abs: 1.1
Lymphs Abs: 1.1
Metamyelocytes Relative: 0
Monocytes Absolute: 0.6
Monocytes Absolute: 1
Monocytes Absolute: 1.7 — ABNORMAL HIGH
Monocytes Relative: 11
Monocytes Relative: 13 — ABNORMAL HIGH
Neutro Abs: 6.8
Neutrophils Relative %: 66
Neutrophils Relative %: 81 — ABNORMAL HIGH
Neutrophils Relative %: 86 — ABNORMAL HIGH
Promyelocytes Absolute: 0

## 2011-07-26 LAB — CBC
HCT: 26 — ABNORMAL LOW
HCT: 26.2 — ABNORMAL LOW
HCT: 27.4 — ABNORMAL LOW
Hemoglobin: 11.2 — ABNORMAL LOW
Hemoglobin: 8.8 — ABNORMAL LOW
Hemoglobin: 9.1 — ABNORMAL LOW
Hemoglobin: 9.3 — ABNORMAL LOW
MCHC: 33.9
MCHC: 34.5
MCHC: 34.7
MCV: 96.6
MCV: 96.7
MCV: 96.7
MCV: 97.1
Platelets: 166
Platelets: 174
Platelets: 177
RBC: 2.69 — ABNORMAL LOW
RBC: 2.8 — ABNORMAL LOW
RBC: 3.35 — ABNORMAL LOW
RDW: 12.9
WBC: 13.8 — ABNORMAL HIGH
WBC: 15 — ABNORMAL HIGH
WBC: 5.4
WBC: 8.4

## 2011-07-26 LAB — POTASSIUM
Potassium: 3.1 — ABNORMAL LOW
Potassium: 3.2 — ABNORMAL LOW

## 2011-07-26 LAB — CULTURE, BLOOD (ROUTINE X 2)
Culture: NO GROWTH
Culture: NO GROWTH
Culture: NO GROWTH
Report Status: 11252008
Report Status: 11252008

## 2011-07-26 LAB — BASIC METABOLIC PANEL
BUN: 1 — ABNORMAL LOW
BUN: 3 — ABNORMAL LOW
CO2: 24
CO2: 24
CO2: 26
CO2: 27
Calcium: 7.8 — ABNORMAL LOW
Calcium: 8.2 — ABNORMAL LOW
Chloride: 101
Chloride: 105
Chloride: 110
Creatinine, Ser: 0.43
Creatinine, Ser: 0.45
Creatinine, Ser: 0.46
Creatinine, Ser: 0.55
GFR calc Af Amer: >60
GFR calc Af Amer: >60
GFR calc Af Amer: >60
GFR calc non Af Amer: >60
Glucose, Bld: 100 — ABNORMAL HIGH
Potassium: 2.9 — ABNORMAL LOW
Potassium: 3 — ABNORMAL LOW
Sodium: 130 — ABNORMAL LOW
Sodium: 134 — ABNORMAL LOW
Sodium: 137
Sodium: 140

## 2011-07-26 LAB — URINALYSIS, ROUTINE W REFLEX MICROSCOPIC
Ketones, ur: 40 — AB
Leukocytes, UA: NEGATIVE
Nitrite: NEGATIVE
Protein, ur: NEGATIVE
pH: 5.5

## 2011-07-26 LAB — CLOSTRIDIUM DIFFICILE EIA: C difficile Toxins A+B, EIA: NEGATIVE

## 2011-07-26 LAB — CSF CELL COUNT WITH DIFFERENTIAL: Tube #: 2

## 2011-07-26 LAB — URINE MICROSCOPIC-ADD ON

## 2011-07-26 LAB — CSF CULTURE W GRAM STAIN: Culture: NO GROWTH

## 2011-07-26 LAB — IRON AND TIBC
Iron: 36 — ABNORMAL LOW
UIBC: 123

## 2011-07-26 LAB — I-STAT 8, (EC8 V) (CONVERTED LAB)
BUN: 15
Bicarbonate: 19.4 — ABNORMAL LOW
Glucose, Bld: 123 — ABNORMAL HIGH
Hemoglobin: 11.9 — ABNORMAL LOW
pCO2, Ven: 28.6 — ABNORMAL LOW
pH, Ven: 7.44 — ABNORMAL HIGH

## 2011-07-26 LAB — URINE CULTURE

## 2011-07-26 LAB — PREGNANCY, URINE: Preg Test, Ur: NEGATIVE

## 2011-07-26 LAB — VITAMIN B12: Vitamin B-12: 447 (ref 211–911)

## 2011-07-26 LAB — PROTEIN AND GLUCOSE, CSF: Total  Protein, CSF: 20

## 2011-07-28 ENCOUNTER — Telehealth: Payer: Self-pay | Admitting: Cardiology

## 2011-07-28 NOTE — Telephone Encounter (Signed)
Pt returning your call

## 2011-07-28 NOTE — Telephone Encounter (Signed)
Pt aware of results 

## 2013-01-01 ENCOUNTER — Other Ambulatory Visit: Payer: Self-pay | Admitting: *Deleted

## 2013-01-01 MED ORDER — DIVALPROEX SODIUM 500 MG PO DR TAB: 1000.0000 mg | DELAYED_RELEASE_TABLET | Freq: Two times a day (BID) | ORAL | Status: DC

## 2013-01-01 NOTE — Addendum Note (Signed)
Addended byDory Peru on: 01/01/2013 05:14 PM   Modules accepted: Orders

## 2013-01-02 ENCOUNTER — Other Ambulatory Visit: Payer: Self-pay | Admitting: *Deleted

## 2013-01-02 DIAGNOSIS — R569 Unspecified convulsions: Secondary | ICD-10-CM

## 2013-01-02 MED ORDER — DIVALPROEX SODIUM 500 MG PO DR TAB: 1000.0000 mg | DELAYED_RELEASE_TABLET | Freq: Two times a day (BID) | ORAL | Status: DC

## 2013-02-20 ENCOUNTER — Other Ambulatory Visit: Payer: Self-pay | Admitting: Nurse Practitioner

## 2013-03-07 ENCOUNTER — Ambulatory Visit (INDEPENDENT_AMBULATORY_CARE_PROVIDER_SITE_OTHER): Payer: Medicaid Other | Admitting: Family Medicine

## 2013-03-07 ENCOUNTER — Encounter: Payer: Self-pay | Admitting: Family Medicine

## 2013-03-07 VITALS — BP 125/76 | HR 79 | Temp 96.8°F | Ht 65.0 in | Wt 163.0 lb

## 2013-03-07 DIAGNOSIS — G40909 Epilepsy, unspecified, not intractable, without status epilepticus: Secondary | ICD-10-CM

## 2013-03-07 DIAGNOSIS — K219 Gastro-esophageal reflux disease without esophagitis: Secondary | ICD-10-CM

## 2013-03-07 DIAGNOSIS — T50905S Adverse effect of unspecified drugs, medicaments and biological substances, sequela: Secondary | ICD-10-CM

## 2013-03-07 DIAGNOSIS — E785 Hyperlipidemia, unspecified: Secondary | ICD-10-CM

## 2013-03-07 DIAGNOSIS — R569 Unspecified convulsions: Secondary | ICD-10-CM

## 2013-03-07 LAB — POCT CBC
Granulocyte percent: 54.4 %G (ref 37–80)
HCT, POC: 38.4 % (ref 37.7–47.9)
Hemoglobin: 13.3 g/dL (ref 12.2–16.2)
Lymph, poc: 1.8 (ref 0.6–3.4)
MCH, POC: 33.2 pg — AB (ref 27–31.2)
MCHC: 34.6 g/dL (ref 31.8–35.4)
MCV: 95.9 fL (ref 80–97)
MPV: 7.7 fL (ref 0–99.8)
POC Granulocyte: 2.4 (ref 2–6.9)
POC LYMPH PERCENT: 40.1 %L (ref 10–50)
Platelet Count, POC: 281 10*3/uL (ref 142–424)
RBC: 4 M/uL — AB (ref 4.04–5.48)
RDW, POC: 12.9 %
WBC: 4.5 10*3/uL — AB (ref 4.6–10.2)

## 2013-03-07 MED ORDER — CARBAMAZEPINE 200 MG PO TABS: 200.0000 mg | ORAL_TABLET | Freq: Four times a day (QID) | ORAL | Status: DC

## 2013-03-07 MED ORDER — ESOMEPRAZOLE MAGNESIUM 40 MG PO CPDR: DELAYED_RELEASE_CAPSULE | ORAL | Status: DC

## 2013-03-07 MED ORDER — DIVALPROEX SODIUM 500 MG PO DR TAB: 1000.0000 mg | DELAYED_RELEASE_TABLET | Freq: Two times a day (BID) | ORAL | Status: DC

## 2013-03-07 MED ORDER — ZONISAMIDE 100 MG PO CAPS: 100.0000 mg | ORAL_CAPSULE | Freq: Every day | ORAL | Status: DC

## 2013-03-07 NOTE — Progress Notes (Signed)
Patient ID: Savannah Shaw, female   DOB: September 02, 1961, 52 y.o.   MRN: [id number] SUBJECTIVE: HPI: Follow up on seizures. Came in to get Rx refilled. Hasn't been in for a while. Doesn't drive. No recent breakthrough seizures doing well except that she recently stepped in a hole and fractured left foot.  PMH/PSH: reviewed/updated in Epic  SH/FH: reviewed/updated in Epic  Allergies: reviewed/updated in Epic  Medications: reviewed/updated in Epic  Immunizations: reviewed/updated in Epic  ROS: As above in the HPI. All other systems are stable or negative.  OBJECTIVE: APPEARANCE:  Patient in no acute distress.The patient appeared well nourished and normally developed. Acyanotic. Waist: VITAL SIGNS:BP 125/76  Pulse 79  Temp(Src) 96.8 F (36 C) (Oral)  Ht 5\' 5"  (1.651 m)  Wt 163 lb (73.936 kg)  BMI 27.12 kg/m2 WF  SKIN: warm and  Dry without overt rashes, tattoos and scars  HEAD and Neck: without JVD, Head and scalp: normal Eyes:No scleral icterus. Fundi normal, eye movements normal. Ears: Auricle normal, canal normal, Tympanic membranes normal, insufflation normal. Nose: normal Throat: normal Neck & thyroid: normal  CHEST & LUNGS: Chest wall: normal Lungs: Clear  CVS: Reveals the PMI to be normally located. Regular rhythm, First and Second Heart sounds are normal,  absence of murmurs, rubs or gallops. Peripheral vasculature: Radial pulses: normal Dorsal pedis pulses: normal Posterior pulses: normal  ABDOMEN:  Appearance: normal Benign,, no organomegaly, no masses, no Abdominal Aortic enlargement. No Guarding , no rebound. No Bruits. Bowel sounds: normal  RECTAL: N/A GU: N/A  EXTREMETIES: nonedematous. Both Femoral and Pedal pulses are normal.  MUSCULOSKELETAL:  Spine: normal Left foot in a waking cast  NEUROLOGIC: oriented to time,place and person; nonfocal. Strength is normal  ASSESSMENT: Seizure disorder - Plan: Valproic acid level, divalproex  (DEPAKOTE) 500 MG DR tablet, zonisamide (ZONEGRAN) 100 MG capsule, carbamazepine (TEGRETOL) 200 MG tablet  Medication side effects, sequela - Plan: POCT CBC, COMPLETE METABOLIC PANEL WITH GFR, Folate, Vitamin B12, Valproic acid level  HLD (hyperlipidemia) - Plan: COMPLETE METABOLIC PANEL WITH GFR, NMR Lipoprofile with Lipids  Convulsions/seizures - Plan: divalproex (DEPAKOTE) 500 MG DR tablet  GERD (gastroesophageal reflux disease) - Plan: esomeprazole (NEXIUM) 40 MG capsule   PLAN: Orders Placed This Encounter  Procedures  . COMPLETE METABOLIC PANEL WITH GFR  . NMR Lipoprofile with Lipids  . Folate  . Vitamin B12  . Valproic acid level  . POCT CBC   Meds ordered this encounter  Medications  . divalproex (DEPAKOTE) 500 MG DR tablet    Sig: Take 2 tablets (1,000 mg total) by mouth 2 (two) times daily. Needs Namebrand    Dispense:  120 tablet    Refill:  5    Verbal given by Helene Kelp, PA  . esomeprazole (NEXIUM) 40 MG capsule    Sig: TAKE ONE CAPSULE BY MOUTH EVERY DAY    Dispense:  30 capsule    Refill:  5  . zonisamide (ZONEGRAN) 100 MG capsule    Sig: Take 1 capsule (100 mg total) by mouth daily.    Dispense:  30 capsule    Refill:  5  . carbamazepine (TEGRETOL) 200 MG tablet    Sig: Take 1 tablet (200 mg total) by mouth 4 (four) times daily.    Dispense:  120 tablet    Refill:  5   Discussed need for regular follow up.  Healthy lifestyle and activities.  Follow up on her right foot injury.  Ongoing self improvement opportunities and  Education discussed.  RTC 6 months.  Await labs.  Jhoselin Crume P. Modesto Charon, M.D.

## 2013-03-08 ENCOUNTER — Telehealth: Payer: Self-pay | Admitting: *Deleted

## 2013-03-08 LAB — VITAMIN B12: Vitamin B-12: 605 pg/mL (ref 211–911)

## 2013-03-08 LAB — COMPLETE METABOLIC PANEL WITH GFR
ALT: 13 U/L (ref 0–35)
AST: 25 U/L (ref 0–37)
Albumin: 4.2 g/dL (ref 3.5–5.2)
Alkaline Phosphatase: 82 U/L (ref 39–117)
BUN: 10 mg/dL (ref 6–23)
CO2: 26 mEq/L (ref 19–32)
Calcium: 9.2 mg/dL (ref 8.4–10.5)
Chloride: 102 mEq/L (ref 96–112)
Creat: 0.52 mg/dL (ref 0.50–1.10)
GFR, Est African American: >89 mL/min
GFR, Est Non African American: >89 mL/min
Glucose, Bld: 85 mg/dL (ref 70–99)
Potassium: 4.5 mEq/L (ref 3.5–5.3)
Sodium: 138 mEq/L (ref 135–145)
Total Bilirubin: 0.2 mg/dL — ABNORMAL LOW (ref 0.3–1.2)
Total Protein: 6.7 g/dL (ref 6.0–8.3)

## 2013-03-08 LAB — VALPROIC ACID LEVEL: Valproic Acid Lvl: 70.1 ug/mL (ref 50.0–100.0)

## 2013-03-08 LAB — FOLATE: Folate: 17.7 ng/mL

## 2013-03-08 NOTE — Telephone Encounter (Signed)
Please advise 

## 2013-03-08 NOTE — Telephone Encounter (Signed)
The generic can be used and still work. We should try it and if it doesn't then we can request authorization for brand name.

## 2013-03-08 NOTE — Telephone Encounter (Signed)
Medicaid will not cover namebrand Depakote without prior authorization and documentation of adverse event or that brand name is medically necessary.

## 2013-03-12 LAB — NMR LIPOPROFILE WITH LIPIDS
Cholesterol, Total: 239 mg/dL — ABNORMAL HIGH (ref <200)
HDL Particle Number: 41.9 umol/L (ref >=30.5)
HDL Size: 9.8 nm (ref >=9.2)
HDL-C: 77 mg/dL (ref >=40)
LDL (calc): 143 mg/dL — ABNORMAL HIGH (ref <100)
LDL Particle Number: 1729 nmol/L — ABNORMAL HIGH (ref <1000)
LDL Size: 20.7 nm (ref >20.5)
LP-IR Score: <25 (ref <=45)
Large HDL-P: 12.5 umol/L (ref >=4.8)
Large VLDL-P: <0.8 nmol/L (ref <=2.7)
Small LDL Particle Number: 524 nmol/L (ref <=527)
Triglycerides: 93 mg/dL (ref <150)
VLDL Size: 33.9 nm (ref <=46.6)

## 2013-03-12 NOTE — Telephone Encounter (Signed)
Pt.notified

## 2013-03-13 ENCOUNTER — Telehealth: Payer: Self-pay

## 2013-03-13 NOTE — Progress Notes (Signed)
Quick Note:  Lab result at goal.the LDLc and LDLp was high but this was offset by the very high HDLC . Would not treat the cholesterol with meds but with diet. Read a book called Eat to LIVE. No change in Medications for now. No Change in plans and follow up. ______

## 2013-03-13 NOTE — Telephone Encounter (Signed)
Pt stated had seizure with the generic depakote.  Would like to get brand name approved    Per Dr Modesto Charon it is medically necessary for pt to have brand name only.

## 2013-03-13 NOTE — Telephone Encounter (Signed)
Savannah Shaw has completed the Prior Auth

## 2013-03-13 NOTE — Telephone Encounter (Signed)
Left message to return call 

## 2013-06-20 ENCOUNTER — Encounter: Payer: Self-pay | Admitting: Family Medicine

## 2013-06-20 ENCOUNTER — Ambulatory Visit (INDEPENDENT_AMBULATORY_CARE_PROVIDER_SITE_OTHER): Payer: Medicaid Other | Admitting: Family Medicine

## 2013-06-20 VITALS — BP 122/66 | HR 66 | Temp 97.6°F | Ht 65.5 in | Wt 158.0 lb

## 2013-06-20 DIAGNOSIS — J329 Chronic sinusitis, unspecified: Secondary | ICD-10-CM

## 2013-06-20 DIAGNOSIS — R062 Wheezing: Secondary | ICD-10-CM

## 2013-06-20 DIAGNOSIS — J069 Acute upper respiratory infection, unspecified: Secondary | ICD-10-CM

## 2013-06-20 MED ORDER — METHYLPREDNISOLONE ACETATE 40 MG/ML IJ SUSP: 80.0000 mg | Freq: Once | INTRAMUSCULAR | Status: DC

## 2013-06-20 MED ORDER — METHYLPREDNISOLONE ACETATE 80 MG/ML IJ SUSP
80.0000 mg | Freq: Once | INTRAMUSCULAR | Status: AC
  Administered 2013-06-20: 80 mg via INTRAMUSCULAR

## 2013-06-20 MED ORDER — DOXYCYCLINE HYCLATE 100 MG PO CAPS: 100.0000 mg | ORAL_CAPSULE | Freq: Two times a day (BID) | ORAL | Status: DC

## 2013-06-20 MED ORDER — ALBUTEROL SULFATE HFA 108 (90 BASE) MCG/ACT IN AERS: 2.0000 | INHALATION_SPRAY | RESPIRATORY_TRACT | Status: DC | PRN

## 2013-06-20 NOTE — Addendum Note (Signed)
Addended by: Gwenith Daily on: 06/20/2013 01:08 PM   Modules accepted: Orders

## 2013-06-20 NOTE — Progress Notes (Signed)
  Subjective:    Patient ID: Savannah Shaw, female    DOB: 07-02-1961, 52 y.o.   MRN: [id number]  HPI  URI Symptoms Onset: 3-4 days  Description: sinus pressure, nasal congestion, cough, mild wheezing  Modifying factors:  Prior 30-35 pack year smoking   Symptoms Nasal discharge: yes Fever: no Sore throat: no Cough: yes Wheezing: yes Ear pain: no GI symptoms: no Sick contacts: yes  Red Flags  Stiff neck: no Dyspnea: mild; some wheezing  Rash: no Swallowing difficulty: no  Sinusitis Risk Factors Headache/face pain: yes Double sickening: no tooth pain: no  Allergy Risk Factors Sneezing: no Itchy scratchy throat: no Seasonal symptoms: no  Flu Risk Factors Headache: no muscle aches: no severe fatigue: no     Review of Systems  All other systems reviewed and are negative.       Objective:   Physical Exam  Constitutional: She appears well-developed and well-nourished.  HENT:  Head: Normocephalic and atraumatic.  Right Ear: External ear normal.  +nasal erythema, rhinorrhea bilaterally, + post oropharyngeal erythema    Eyes: Conjunctivae are normal. Pupils are equal, round, and reactive to light.  Neck: Normal range of motion.  Cardiovascular: Normal rate.   Pulmonary/Chest: Effort normal. She has wheezes.  Abdominal: Soft.  Musculoskeletal: Normal range of motion.  Neurological: She is alert.  Skin: Skin is warm.          Assessment & Plan:  Wheezing - Plan: methylPREDNISolone acetate (DEPO-MEDROL) injection 80 mg, albuterol (PROVENTIL HFA;VENTOLIN HFA) 108 (90 BASE) MCG/ACT inhaler  Sinusitis - Plan: doxycycline (VIBRAMYCIN) 100 MG capsule  URI (upper respiratory infection) - Plan: doxycycline (VIBRAMYCIN) 100 MG capsule  Suspect predominantly viral etiology of symptoms. Depo-Medrol 80 mg IM x1 for wheezing. Will place on doxycycline for infectious and lower respiratory coverage. Albuterol as needed for wheezing. Discussed with patient she may  need formal lung studies after resolution of current illness to eval for any degree of obstructive lung disease especially given extensive smoking history. Discuss infectious and respiratory red flags in length patient. Followup as needed.

## 2013-08-01 ENCOUNTER — Ambulatory Visit: Payer: Medicaid Other | Admitting: Family Medicine

## 2013-09-04 ENCOUNTER — Ambulatory Visit (INDEPENDENT_AMBULATORY_CARE_PROVIDER_SITE_OTHER): Payer: Medicaid Other

## 2013-09-04 ENCOUNTER — Ambulatory Visit (INDEPENDENT_AMBULATORY_CARE_PROVIDER_SITE_OTHER): Payer: Medicaid Other | Admitting: Family Medicine

## 2013-09-04 ENCOUNTER — Encounter: Payer: Self-pay | Admitting: Family Medicine

## 2013-09-04 VITALS — BP 122/72 | HR 85 | Temp 97.4°F | Wt 157.6 lb

## 2013-09-04 DIAGNOSIS — M549 Dorsalgia, unspecified: Secondary | ICD-10-CM

## 2013-09-04 MED ORDER — CYCLOBENZAPRINE HCL 10 MG PO TABS: 10.0000 mg | ORAL_TABLET | Freq: Three times a day (TID) | ORAL | Status: DC | PRN

## 2013-09-04 MED ORDER — NAPROXEN 500 MG PO TABS: 500.0000 mg | ORAL_TABLET | Freq: Two times a day (BID) | ORAL | Status: DC

## 2013-09-04 NOTE — Patient Instructions (Signed)
Back Pain, Adult Low back pain is very common. About 1 in 5 people have back pain.The cause of low back pain is rarely dangerous. The pain often gets better over time.About half of people with a sudden onset of back pain feel better in just 2 weeks. About 8 in 10 people feel better by 6 weeks.  CAUSES Some common causes of back pain include:  Strain of the muscles or ligaments supporting the spine.  Wear and tear (degeneration) of the spinal discs.  Arthritis.  Direct injury to the back. DIAGNOSIS Most of the time, the direct cause of low back pain is not known.However, back pain can be treated effectively even when the exact cause of the pain is unknown.Answering your caregiver's questions about your overall health and symptoms is one of the most accurate ways to make sure the cause of your pain is not dangerous. If your caregiver needs more information, he or she may order lab work or imaging tests (X-rays or MRIs).However, even if imaging tests show changes in your back, this usually does not require surgery. HOME CARE INSTRUCTIONS For many people, back pain returns.Since low back pain is rarely dangerous, it is often a condition that people can learn to manageon their own.   Remain active. It is stressful on the back to sit or stand in one place. Do not sit, drive, or stand in one place for more than 30 minutes at a time. Take short walks on level surfaces as soon as pain allows.Try to increase the length of time you walk each day.  Do not stay in bed.Resting more than 1 or 2 days can delay your recovery.  Do not avoid exercise or work.Your body is made to move.It is not dangerous to be active, even though your back may hurt.Your back will likely heal faster if you return to being active before your pain is gone.  Pay attention to your body when you bend and lift. Many people have less discomfortwhen lifting if they bend their knees, keep the load close to their bodies,and  avoid twisting. Often, the most comfortable positions are those that put less stress on your recovering back.  Find a comfortable position to sleep. Use a firm mattress and lie on your side with your knees slightly bent. If you lie on your back, put a pillow under your knees.  Only take over-the-counter or prescription medicines as directed by your caregiver. Over-the-counter medicines to reduce pain and inflammation are often the most helpful.Your caregiver may prescribe muscle relaxant drugs.These medicines help dull your pain so you can more quickly return to your normal activities and healthy exercise.  Put ice on the injured area.  Put ice in a plastic bag.  Place a towel between your skin and the bag.  Leave the ice on for 15-20 minutes, 03-04 times a day for the first 2 to 3 days. After that, ice and heat may be alternated to reduce pain and spasms.  Ask your caregiver about trying back exercises and gentle massage. This may be of some benefit.  Avoid feeling anxious or stressed.Stress increases muscle tension and can worsen back pain.It is important to recognize when you are anxious or stressed and learn ways to manage it.Exercise is a great option. SEEK MEDICAL CARE IF:  You have pain that is not relieved with rest or medicine.  You have pain that does not improve in 1 week.  You have new symptoms.  You are generally not feeling well. SEEK   IMMEDIATE MEDICAL CARE IF:   You have pain that radiates from your back into your legs.  You develop new bowel or bladder control problems.  You have unusual weakness or numbness in your arms or legs.  You develop nausea or vomiting.  You develop abdominal pain.  You feel faint. Document Released: 10/03/2005 Document Revised: 04/03/2012 Document Reviewed: 02/21/2011 [employer] Patient Information 2014 [employer].  

## 2013-09-04 NOTE — Progress Notes (Signed)
  Subjective:    Patient ID: Savannah Shaw, female    DOB: [birth year], 52 y.o.   MRN: [id number]  HPI Pt presents to office with complaints of lower back pain. Pt states she fell down her steps last night. Pt states her pain is 7 out 10 when she sits or "getting up". Standing upright relieves the pain. Pt states she took  Tylenol with mild relief.    Review of Systems  Respiratory: Negative.   Cardiovascular: Negative.   Musculoskeletal: Positive for back pain.  All other systems reviewed and are negative.       Objective:   Physical Exam  Vitals reviewed. Constitutional: She is oriented to person, place, and time.  Cardiovascular: Normal rate, regular rhythm, normal heart sounds and intact distal pulses.   No murmur heard. Pulmonary/Chest: Effort normal and breath sounds normal. No respiratory distress.  Musculoskeletal: She exhibits tenderness. She exhibits no edema.  Decreased ROM of lower coccyx area with bending due to pain  Neurological: She is alert and oriented to person, place, and time.  Skin: Skin is warm and dry.  Psychiatric: She has a normal mood and affect. Her behavior is normal. Judgment and thought content normal.    BP 122/72  Pulse 85  Temp(Src) 97.4 F (36.3 C) (Oral)  Wt 157 lb 9.6 oz (71.487 kg)       Assessment & Plan:  Back pain - Plan: DG Lumbar Spine 2-3 Views, naproxen (NAPROSYN) 500 MG tablet, cyclobenzaprine (FLEXERIL) 10 MG tablet  Deatra Canter FNP

## 2013-09-10 ENCOUNTER — Encounter: Payer: Self-pay | Admitting: Family Medicine

## 2013-09-10 ENCOUNTER — Ambulatory Visit: Payer: Medicaid Other | Admitting: Family Medicine

## 2013-09-10 ENCOUNTER — Ambulatory Visit (INDEPENDENT_AMBULATORY_CARE_PROVIDER_SITE_OTHER): Payer: Medicaid Other | Admitting: Family Medicine

## 2013-09-10 VITALS — BP 125/77 | HR 83 | Temp 97.6°F | Ht 65.5 in | Wt 159.0 lb

## 2013-09-10 DIAGNOSIS — K219 Gastro-esophageal reflux disease without esophagitis: Secondary | ICD-10-CM

## 2013-09-10 DIAGNOSIS — G40909 Epilepsy, unspecified, not intractable, without status epilepticus: Secondary | ICD-10-CM

## 2013-09-10 DIAGNOSIS — R569 Unspecified convulsions: Secondary | ICD-10-CM

## 2013-09-10 DIAGNOSIS — R062 Wheezing: Secondary | ICD-10-CM

## 2013-09-10 DIAGNOSIS — R350 Frequency of micturition: Secondary | ICD-10-CM

## 2013-09-10 LAB — POCT UA - MICROSCOPIC ONLY
Casts, Ur, LPF, POC: NEGATIVE
Crystals, Ur, HPF, POC: NEGATIVE
Yeast, UA: NEGATIVE

## 2013-09-10 LAB — POCT URINALYSIS DIPSTICK
Bilirubin, UA: NEGATIVE
Glucose, UA: NEGATIVE
Ketones, UA: NEGATIVE
Nitrite, UA: POSITIVE
Spec Grav, UA: 1.01
Urobilinogen, UA: NEGATIVE
pH, UA: 6.5

## 2013-09-10 MED ORDER — CARBAMAZEPINE 200 MG PO TABS: 200.0000 mg | ORAL_TABLET | Freq: Four times a day (QID) | ORAL | Status: DC

## 2013-09-10 MED ORDER — DIVALPROEX SODIUM 500 MG PO DR TAB: 1000.0000 mg | DELAYED_RELEASE_TABLET | Freq: Two times a day (BID) | ORAL | Status: DC

## 2013-09-10 MED ORDER — ESOMEPRAZOLE MAGNESIUM 40 MG PO CPDR: DELAYED_RELEASE_CAPSULE | ORAL | Status: DC

## 2013-09-10 MED ORDER — ZONISAMIDE 100 MG PO CAPS: 100.0000 mg | ORAL_CAPSULE | Freq: Every day | ORAL | Status: DC

## 2013-09-10 MED ORDER — CIPROFLOXACIN HCL 250 MG PO TABS: 250.0000 mg | ORAL_TABLET | Freq: Two times a day (BID) | ORAL | Status: DC

## 2013-09-10 NOTE — Addendum Note (Signed)
Addended by: Bearl Mulberry on: 09/10/2013 10:14 AM   Modules accepted: Orders

## 2013-09-10 NOTE — Progress Notes (Signed)
Subjective:    Patient ID: Savannah Shaw, female    DOB: [birth year], 51 y.o.   MRN: [id number]  Urinary Tract Infection  This is a new problem. The current episode started in the past 7 days (Sunday). The problem occurs intermittently. The problem has been gradually worsening. The quality of the pain is described as shooting. The pain is at a severity of 8/10. The pain is moderate. There has been no fever. The fever has been present for less than 1 day. Associated symptoms include hematuria. Pertinent negatives include no chills, flank pain, nausea or vomiting. She has tried acetaminophen for the symptoms. The treatment provided mild relief. There is no history of kidney stones or a single kidney.  Gastrophageal Reflux She reports no chest pain, no coughing, no heartburn, no nausea or no stridor. This is a chronic problem. The current episode started more than 1 year ago. The problem occurs rarely. The problem has been resolved. The symptoms are aggravated by certain foods. Pertinent negatives include no fatigue or muscle weakness. She has tried a PPI for the symptoms. The treatment provided moderate relief. Past invasive treatments do not include reflux surgery.  Seizure Disorder Pt currently taking Tegretol, Depakote, and Zonegran daily- Pt states her last seizure was over 6 years ago. States it has been over a year since she saw her neurologist   Pt fell down stairs last Tuesday and hurt her lower back. Pt taking naproxen and flexeril temporally til inflammation and pain decreases.     Review of Systems  Constitutional: Negative for chills and fatigue.  Respiratory: Negative.  Negative for cough.   Cardiovascular: Negative.  Negative for chest pain.  Gastrointestinal: Negative.  Negative for heartburn, nausea and vomiting.  Genitourinary: Positive for hematuria. Negative for flank pain.  Musculoskeletal: Positive for back pain. Negative for muscle weakness.  Neurological: Negative.   All  other systems reviewed and are negative.       Objective:   Physical Exam  Vitals reviewed. Constitutional: She is oriented to person, place, and time. She appears well-developed and well-nourished.  HENT:  Head: Normocephalic.  Right Ear: External ear normal.  Left Ear: External ear normal.  Nose: Nose normal.  Mouth/Throat: Oropharynx is clear and moist.  Eyes: Pupils are equal, round, and reactive to light.  Neck: Normal range of motion. Neck supple. No thyromegaly present.  Cardiovascular: Normal rate, regular rhythm, normal heart sounds and intact distal pulses.   No murmur heard. Pulmonary/Chest: Effort normal and breath sounds normal. No respiratory distress. She has no wheezes.  Abdominal: Soft. Bowel sounds are normal. She exhibits no distension. There is no tenderness.  Musculoskeletal: She exhibits tenderness. She exhibits no edema.  Decreased ROM of lower back due to pain from fall last Tuesday.  Negative CVA tenderness   Neurological: She is alert and oriented to person, place, and time. No cranial nerve deficit.  Skin: Skin is warm and dry.  Psychiatric: She has a normal mood and affect. Her behavior is normal. Judgment and thought content normal.    BP 125/77  Pulse 83  Temp(Src) 97.6 F (36.4 C) (Oral)  Ht 5' 5.5" (1.664 m)  Wt 159 lb (72.122 kg)  BMI 26.05 kg/m2       Assessment & Plan:  Urinary frequency - Plan: POCT urinalysis dipstick, POCT UA - Microscopic Only, ciprofloxacin (CIPRO) 250 MG tablet  Seizure disorder - Plan: carbamazepine (TEGRETOL) 200 MG tablet, divalproex (DEPAKOTE) 500 MG DR tablet, zonisamide (ZONEGRAN) 100 MG capsule  Convulsions/seizures - Plan: carbamazepine (TEGRETOL) 200 MG tablet, divalproex (DEPAKOTE) 500 MG DR tablet, zonisamide (ZONEGRAN) 100 MG capsule  GERD (gastroesophageal reflux disease) - Plan: esomeprazole (NEXIUM) 40 MG capsule  Deatra Canter FNP

## 2013-09-10 NOTE — Patient Instructions (Signed)

## 2014-03-08 ENCOUNTER — Other Ambulatory Visit: Payer: Self-pay | Admitting: Family Medicine

## 2014-03-11 NOTE — Telephone Encounter (Signed)
Last seen 09/10/13  B Oxford 

## 2014-03-12 ENCOUNTER — Other Ambulatory Visit: Payer: Self-pay | Admitting: Family Medicine

## 2014-03-13 NOTE — Telephone Encounter (Signed)
Last seen 08/2013 

## 2014-03-26 ENCOUNTER — Other Ambulatory Visit: Payer: Self-pay | Admitting: Family Medicine

## 2014-03-27 NOTE — Telephone Encounter (Signed)
Last seen 09/10/13  B Oxford

## 2014-04-06 ENCOUNTER — Other Ambulatory Visit: Payer: Self-pay | Admitting: Family Medicine

## 2014-04-26 ENCOUNTER — Other Ambulatory Visit: Payer: Self-pay | Admitting: Family Medicine

## 2014-05-07 ENCOUNTER — Ambulatory Visit (INDEPENDENT_AMBULATORY_CARE_PROVIDER_SITE_OTHER): Payer: Medicaid Other | Admitting: Family Medicine

## 2014-05-07 ENCOUNTER — Ambulatory Visit (INDEPENDENT_AMBULATORY_CARE_PROVIDER_SITE_OTHER): Payer: Medicaid Other

## 2014-05-07 ENCOUNTER — Encounter: Payer: Self-pay | Admitting: Family Medicine

## 2014-05-07 VITALS — BP 141/72 | HR 70 | Temp 99.3°F | Ht 65.5 in | Wt 145.6 lb

## 2014-05-07 DIAGNOSIS — R109 Unspecified abdominal pain: Secondary | ICD-10-CM

## 2014-05-07 DIAGNOSIS — N390 Urinary tract infection, site not specified: Secondary | ICD-10-CM

## 2014-05-07 LAB — POCT URINALYSIS DIPSTICK
Bilirubin, UA: NEGATIVE
Glucose, UA: NEGATIVE
Nitrite, UA: POSITIVE
Spec Grav, UA: 1.025
Urobilinogen, UA: NEGATIVE
pH, UA: 6

## 2014-05-07 LAB — POCT CBC
Granulocyte percent: 71.4 %G (ref 37–80)
HCT, POC: 41 % (ref 37.7–47.9)
Hemoglobin: 13.3 g/dL (ref 12.2–16.2)
Lymph, poc: 1.6 (ref 0.6–3.4)
MCH, POC: 31.5 pg — AB (ref 27–31.2)
MCHC: 32.4 g/dL (ref 31.8–35.4)
MCV: 97.1 fL — AB (ref 80–97)
MPV: 7.4 fL (ref 0–99.8)
POC Granulocyte: 5.6 (ref 2–6.9)
POC LYMPH PERCENT: 20.2 %L (ref 10–50)
Platelet Count, POC: 242 10*3/uL (ref 142–424)
RBC: 4.2 M/uL (ref 4.04–5.48)
RDW, POC: 13 %
WBC: 7.9 10*3/uL (ref 4.6–10.2)

## 2014-05-07 LAB — POCT UA - MICROSCOPIC ONLY
Casts, Ur, LPF, POC: NEGATIVE
Crystals, Ur, HPF, POC: NEGATIVE
Mucus, UA: NEGATIVE
Yeast, UA: NEGATIVE

## 2014-05-07 MED ORDER — ONDANSETRON 8 MG PO TBDP: 8.0000 mg | ORAL_TABLET | Freq: Three times a day (TID) | ORAL | Status: DC | PRN

## 2014-05-07 MED ORDER — CIPROFLOXACIN HCL 500 MG PO TABS: 500.0000 mg | ORAL_TABLET | Freq: Two times a day (BID) | ORAL | Status: DC

## 2014-05-07 NOTE — Progress Notes (Signed)
   Subjective:    Patient ID: Savannah Shaw, female    DOB: 11-26-1960, 53 y.o.   MRN: [id number]  HPI  This 53 y.o. female presents for evaluation of abdominal pain and nausea. She is having diarrhea and nausea for 2 days. She is having lower abdominal pain.  Review of Systems C/o NVD and lower abdomen pain   No chest pain, SOB, HA, dizziness, vision change, constipation, dysuria, urinary urgency or frequency, myalgias, arthralgias or rash.  Objective:   Physical Exam  Vital signs noted  Well developed well nourished female.  HEENT - Head atraumatic Normocephalic                Eyes - PERRLA, Conjuctiva - clear Sclera- Clear EOMI                 Throat - oropharanx wnl Respiratory - Lungs CTA bilateral Cardiac - RRR S1 and S2 without murmur GI - Abdomen soft tender lower abdomen and bowel sounds active x 4 Extremities - No edema. Neuro - Grossly intact.  Results for orders placed in visit on 05/07/14  POCT CBC      Result Value Ref Range   WBC 7.9  4.6 - 10.2 K/uL   Lymph, poc 1.6  0.6 - 3.4   POC LYMPH PERCENT 20.2  10 - 50 %L   POC Granulocyte 5.6  2 - 6.9   Granulocyte percent 71.4  37 - 80 %G   RBC 4.2  4.04 - 5.48 M/uL   Hemoglobin 13.3  12.2 - 16.2 g/dL   HCT, POC 41.0  37.7 - 47.9 %   MCV 97.1 (*) 80 - 97 fL   MCH, POC 31.5 (*) 27 - 31.2 pg   MCHC 32.4  31.8 - 35.4 g/dL   RDW, POC 13.0     Platelet Count, POC 242.0  142 - 424 K/uL   MPV 7.4  0 - 99.8 fL  POCT UA - MICROSCOPIC ONLY      Result Value Ref Range   WBC, Ur, HPF, POC tntc     RBC, urine, microscopic 10-15     Bacteria, U Microscopic many     Mucus, UA negative     Epithelial cells, urine per micros few     Crystals, Ur, HPF, POC negative     Casts, Ur, LPF, POC negative     Yeast, UA negative    POCT URINALYSIS DIPSTICK      Result Value Ref Range   Color, UA gold     Clarity, UA cloudy     Glucose, UA negative     Bilirubin, UA negative     Ketones, UA large     Spec Grav, UA 1.025     Blood, UA large     pH, UA 6.0     Protein, UA 4+     Urobilinogen, UA negative     Nitrite, UA positive     Leukocytes, UA large (3+)         Assessment & Plan:  Abdominal pain, unspecified site - Plan: ondansetron (ZOFRAN ODT) 8 MG disintegrating tablet, POCT CBC, DG Abd 1 View, POCT UA - Microscopic Only, POCT urinalysis dipstick, Urine culture  Urinary tract infection, site not specified - Plan: Urine culture  Lysbeth Penner FNP

## 2014-05-08 ENCOUNTER — Other Ambulatory Visit: Payer: Self-pay | Admitting: Family Medicine

## 2014-05-09 ENCOUNTER — Encounter: Payer: Self-pay | Admitting: Family Medicine

## 2014-05-09 ENCOUNTER — Ambulatory Visit (INDEPENDENT_AMBULATORY_CARE_PROVIDER_SITE_OTHER): Payer: Medicaid Other | Admitting: Family Medicine

## 2014-05-09 ENCOUNTER — Other Ambulatory Visit: Payer: Self-pay | Admitting: Family Medicine

## 2014-05-09 VITALS — BP 114/74 | HR 63 | Temp 98.0°F | Ht 65.5 in | Wt 146.0 lb

## 2014-05-09 DIAGNOSIS — R197 Diarrhea, unspecified: Secondary | ICD-10-CM

## 2014-05-09 LAB — URINE CULTURE

## 2014-05-09 MED ORDER — DIPHENOXYLATE-ATROPINE 2.5-0.025 MG PO TABS: 2.0000 | ORAL_TABLET | Freq: Four times a day (QID) | ORAL | Status: DC | PRN

## 2014-05-09 MED ORDER — CIPROFLOXACIN HCL 500 MG PO TABS: 500.0000 mg | ORAL_TABLET | Freq: Two times a day (BID) | ORAL | Status: DC

## 2014-05-09 NOTE — Progress Notes (Signed)
   Subjective:    Patient ID: Savannah Shaw, female    DOB: 1961-04-19, 53 y.o.   MRN: [id number]  HPI This 53 y.o. female presents for evaluation of diarrhea.  She has been having 12 diarrhea bm's a day.   Review of Systems C/o diarrhea No chest pain, SOB, HA, dizziness, vision change, N/V, constipation, dysuria, urinary urgency or frequency, myalgias, arthralgias or rash.     Objective:   Physical Exam Vital signs noted  Well developed well nourished female.  HEENT - Head atraumatic Normocephalic                Eyes - PERRLA, Conjuctiva - clear Sclera- Clear EOMI                Ears - EAC's Wnl TM's Wnl Gross Hearing WNL                Throat - oropharanx wnl Respiratory - Lungs CTA bilateral Cardiac - RRR S1 and S2 without murmur GI - Abdomen soft Nontender and bowel sounds active x 4      Assessment & Plan:  Diarrhea - Plan: ciprofloxacin (CIPRO) 500 MG tablet, diphenoxylate-atropine (LOMOTIL) 2.5-0.025 MG per tablet Push po fluids and give tincture of time  Lysbeth Penner FNP

## 2014-05-12 ENCOUNTER — Other Ambulatory Visit: Payer: Self-pay | Admitting: Family Medicine

## 2014-05-12 MED ORDER — FLUCONAZOLE 150 MG PO TABS: 150.0000 mg | ORAL_TABLET | Freq: Once | ORAL | Status: DC

## 2014-05-26 ENCOUNTER — Encounter: Payer: Self-pay | Admitting: Gastroenterology

## 2014-08-01 ENCOUNTER — Other Ambulatory Visit: Payer: Self-pay

## 2014-08-27 ENCOUNTER — Other Ambulatory Visit: Payer: Self-pay | Admitting: Family Medicine

## 2014-08-27 NOTE — Telephone Encounter (Signed)
Has not had a level drawn since 02-27-13. Please advise on refill

## 2014-09-26 ENCOUNTER — Other Ambulatory Visit: Payer: Self-pay | Admitting: Family Medicine

## 2014-10-21 ENCOUNTER — Ambulatory Visit: Payer: Medicaid Other | Admitting: Nurse Practitioner

## 2014-12-23 ENCOUNTER — Other Ambulatory Visit: Payer: Self-pay | Admitting: Family Medicine

## 2014-12-24 NOTE — Telephone Encounter (Signed)
Last seen 05/09/14 B Oxford

## 2015-01-26 ENCOUNTER — Other Ambulatory Visit: Payer: Self-pay | Admitting: Nurse Practitioner

## 2015-01-29 ENCOUNTER — Encounter: Payer: Self-pay | Admitting: Family Medicine

## 2015-01-29 ENCOUNTER — Ambulatory Visit (INDEPENDENT_AMBULATORY_CARE_PROVIDER_SITE_OTHER): Payer: Medicaid Other | Admitting: Family Medicine

## 2015-01-29 ENCOUNTER — Telehealth: Payer: Self-pay | Admitting: Family Medicine

## 2015-01-29 VITALS — BP 123/73 | HR 70 | Temp 96.9°F | Ht 65.5 in | Wt 157.0 lb

## 2015-01-29 DIAGNOSIS — R569 Unspecified convulsions: Secondary | ICD-10-CM

## 2015-01-29 DIAGNOSIS — Z79899 Other long term (current) drug therapy: Secondary | ICD-10-CM | POA: Diagnosis not present

## 2015-01-29 MED ORDER — DEPAKOTE 500 MG PO TBEC: 1000.0000 mg | DELAYED_RELEASE_TABLET | Freq: Two times a day (BID) | ORAL | Status: DC

## 2015-01-29 MED ORDER — ESOMEPRAZOLE MAGNESIUM 40 MG PO CPDR: 40.0000 mg | DELAYED_RELEASE_CAPSULE | Freq: Every day | ORAL | Status: DC

## 2015-01-29 MED ORDER — ZONISAMIDE 100 MG PO CAPS: ORAL_CAPSULE | ORAL | Status: DC

## 2015-01-29 MED ORDER — CARBAMAZEPINE 200 MG PO TABS: ORAL_TABLET | ORAL | Status: DC

## 2015-01-29 NOTE — Progress Notes (Signed)
   Subjective:    Patient ID: Savannah Shaw, female    DOB: 1960/12/15, 54 y.o.   MRN: [id number]  HPI Pt here for follow up and management of chronic medical problems which includes seizures. She needs refills on all meds today. Last seizure was about 8 years ago. She currently takes Tegretol, Depakote, and Zonisamide. These are well tolerated without side effects. She previously had had some tremor in her hands but that has more or less resolved. Blood levels have not been checked in several years and I think it might be appropriate to do that to monitor her anticonvulsant levels      Patient Active Problem List   Diagnosis Date Noted  . Seizure disorder 09/10/2013  . Dyspnea 07/06/2011  . Edema 07/06/2011  . Hyperlipidemia 07/06/2011  . ESOPHAGEAL REFLUX 10/19/2009  . DIARRHEA 09/15/2009  . ANEMIA, NORMOCYTIC 09/07/2008   Outpatient Encounter Prescriptions as of 01/29/2015  Medication Sig  . carbamazepine (TEGRETOL) 200 MG tablet TAKE 1 TABLET (200 MG TOTAL) BY MOUTH 4 (FOUR) TIMES DAILY.  Marland Kitchen DEPAKOTE 500 MG DR tablet TAKE 2 TABLETS TWICE A DAY  . esomeprazole (NEXIUM) 40 MG capsule TAKE ONE CAPSULE BY MOUTH EVERY DAY  . fish oil-omega-3 fatty acids 1000 MG capsule Take 1 capsule by mouth daily.    Marland Kitchen zonisamide (ZONEGRAN) 100 MG capsule TAKE 1 CAPSULE (100 MG TOTAL) BY MOUTH DAILY.  . [DISCONTINUED] b complex vitamins tablet Take 1 tablet by mouth daily.    . [DISCONTINUED] esomeprazole (NEXIUM) 40 MG capsule TAKE ONE CAPSULE BY MOUTH EVERY DAY  . [DISCONTINUED] fluconazole (DIFLUCAN) 150 MG tablet Take 1 tablet (150 mg total) by mouth once.  . [DISCONTINUED] ondansetron (ZOFRAN ODT) 8 MG disintegrating tablet Take 1 tablet (8 mg total) by mouth every 8 (eight) hours as needed for nausea or vomiting.  . [DISCONTINUED] zonisamide (ZONEGRAN) 100 MG capsule TAKE 1 CAPSULE (100 MG TOTAL) BY MOUTH DAILY.  . [DISCONTINUED] ciprofloxacin (CIPRO) 500 MG tablet Take 1 tablet (500 mg total) by  mouth 2 (two) times daily.  . [DISCONTINUED] ciprofloxacin (CIPRO) 500 MG tablet Take 1 tablet (500 mg total) by mouth 2 (two) times daily.  . [DISCONTINUED] methylPREDNISolone acetate (DEPO-MEDROL) injection 80 mg      Review of Systems  Constitutional: Negative.   Eyes: Negative.   Respiratory: Negative.   Cardiovascular: Negative.   Gastrointestinal: Negative.   Endocrine: Negative.   Genitourinary: Negative.   Musculoskeletal: Negative.   Skin: Negative.   Allergic/Immunologic: Negative.   Neurological: Positive for headaches.  Hematological: Negative.   Psychiatric/Behavioral: Negative.        Objective:   Physical Exam  Constitutional: She is oriented to person, place, and time. She appears well-developed and well-nourished.  Cardiovascular: Normal rate and regular rhythm.   Pulmonary/Chest: Effort normal.  Neurological: She is alert and oriented to person, place, and time. She has normal reflexes.   BP 123/73 mmHg  Pulse 70  Temp(Src) 96.9 F (36.1 C) (Oral)  Ht 5' 5.5" (1.664 m)  Wt 157 lb (71.215 kg)  BMI 25.72 kg/m2        Assessment & Plan:  1. High risk medication use Tolerates medicines well no changes recommended - Carbamazepine level, total - Valproic acid level  2. Seizures No seizures we'll continue with same medications unless blood levels come back markedly abnormal - Carbamazepine level, total - Valproic acid level  Wardell Honour MD

## 2015-01-29 NOTE — Patient Instructions (Signed)

## 2015-01-29 NOTE — Telephone Encounter (Signed)
rx taken care of

## 2015-01-30 ENCOUNTER — Telehealth: Payer: Self-pay | Admitting: *Deleted

## 2015-01-30 LAB — CARBAMAZEPINE LEVEL, TOTAL: Carbamazepine Lvl: 5.5 ug/mL (ref 4.0–12.0)

## 2015-01-30 LAB — VALPROIC ACID LEVEL: Valproic Acid Lvl: 68 ug/mL (ref 50–100)

## 2015-01-30 NOTE — Telephone Encounter (Signed)
lmtcb regarding test results. 

## 2015-01-30 NOTE — Telephone Encounter (Signed)
-----   Message from Wardell Honour, MD sent at 01/30/2015  9:46 AM EDT ----- Blood levels are therapeutic; stay on same doses

## 2015-01-30 NOTE — Progress Notes (Signed)
Patient aware.

## 2015-04-06 ENCOUNTER — Ambulatory Visit (INDEPENDENT_AMBULATORY_CARE_PROVIDER_SITE_OTHER): Payer: Medicaid Other | Admitting: Family Medicine

## 2015-04-06 ENCOUNTER — Encounter: Payer: Self-pay | Admitting: Family Medicine

## 2015-04-06 VITALS — BP 106/72 | HR 69 | Temp 97.5°F | Ht 65.5 in | Wt 153.0 lb

## 2015-04-06 DIAGNOSIS — Z01419 Encounter for gynecological examination (general) (routine) without abnormal findings: Secondary | ICD-10-CM

## 2015-04-06 NOTE — Addendum Note (Signed)
Addended by: Earlene Plater on: 04/06/2015 10:22 AM   Modules accepted: Miquel Dunn

## 2015-04-06 NOTE — Addendum Note (Signed)
Addended by: Ilean China on: 04/06/2015 09:59 AM   Modules accepted: Orders

## 2015-04-06 NOTE — Progress Notes (Signed)
   Subjective:    Patient ID: Savannah Shaw, female    DOB: 10/09/1961, 54 y.o.   MRN: [id number]  HPI 54 year old female here for GYN exam. In the past she has had abnormal Pap smears but really does not have much detail on what was found or done. She does not recall her last Pap smear. She has had mammograms; last one was last year. She has no menopausal symptoms. There is no bleeding.  Patient Active Problem List   Diagnosis Date Noted  . Seizure disorder 09/10/2013  . Dyspnea 07/06/2011  . Edema 07/06/2011  . Hyperlipidemia 07/06/2011  . ESOPHAGEAL REFLUX 10/19/2009  . DIARRHEA 09/15/2009  . ANEMIA, NORMOCYTIC 09/07/2008   Outpatient Encounter Prescriptions as of 04/06/2015  Medication Sig  . carbamazepine (TEGRETOL) 200 MG tablet TAKE 1 TABLET (200 MG TOTAL) BY MOUTH 4 (FOUR) TIMES DAILY.  Marland Kitchen DEPAKOTE 500 MG DR tablet Take 2 tablets (1,000 mg total) by mouth 2 (two) times daily.  Marland Kitchen esomeprazole (NEXIUM) 40 MG capsule Take 1 capsule (40 mg total) by mouth daily.  . fish oil-omega-3 fatty acids 1000 MG capsule Take 1 capsule by mouth daily.    Marland Kitchen zonisamide (ZONEGRAN) 100 MG capsule TAKE 1 CAPSULE (100 MG TOTAL) BY MOUTH DAILY.   No facility-administered encounter medications on file as of 04/06/2015.      Review of Systems  Constitutional: Negative.   HENT: Negative.   Eyes: Negative.   Respiratory: Negative.   Cardiovascular: Negative.   Gastrointestinal: Negative.   Endocrine: Negative.   Genitourinary: Negative.   Neurological: Positive for seizures.  Hematological: Negative.   Psychiatric/Behavioral: Negative.        Objective:   Physical Exam  Constitutional: She appears well-developed and well-nourished.  Genitourinary: Vagina normal and uterus normal. No vaginal discharge found.  Breast exam shows no skin changes and no lumps or nipple discharge.    BP 106/72 mmHg  Pulse 69  Temp(Src) 97.5 F (36.4 C) (Oral)  Ht 5' 5.5" (1.664 m)  Wt 153 lb (69.4 kg)  BMI  25.06 kg/m2       Assessment & Plan:   1. Encounter for routine gynecological examination Exam is normal. Would recommend she follow-up in one year with history of abnormal Pap in the past  Wardell Honour MD

## 2015-04-07 LAB — PAP IG W/ RFLX HPV ASCU: PAP SMEAR COMMENT: 0

## 2015-07-19 ENCOUNTER — Emergency Department (HOSPITAL_COMMUNITY): Payer: Medicaid Other

## 2015-07-19 ENCOUNTER — Encounter (HOSPITAL_COMMUNITY): Payer: Self-pay | Admitting: Emergency Medicine

## 2015-07-19 ENCOUNTER — Emergency Department (HOSPITAL_COMMUNITY)
Admission: EM | Admit: 2015-07-19 | Discharge: 2015-07-19 | Disposition: A | Discharge status: Home / Self Care | Payer: Medicaid Other | Attending: Emergency Medicine | Admitting: Emergency Medicine

## 2015-07-19 DIAGNOSIS — Z87891 Personal history of nicotine dependence: Secondary | ICD-10-CM | POA: Diagnosis not present

## 2015-07-19 DIAGNOSIS — Z8639 Personal history of other endocrine, nutritional and metabolic disease: Secondary | ICD-10-CM | POA: Diagnosis not present

## 2015-07-19 DIAGNOSIS — Z79899 Other long term (current) drug therapy: Secondary | ICD-10-CM | POA: Diagnosis not present

## 2015-07-19 DIAGNOSIS — G40909 Epilepsy, unspecified, not intractable, without status epilepticus: Secondary | ICD-10-CM | POA: Diagnosis not present

## 2015-07-19 DIAGNOSIS — Z87442 Personal history of urinary calculi: Secondary | ICD-10-CM | POA: Insufficient documentation

## 2015-07-19 DIAGNOSIS — K219 Gastro-esophageal reflux disease without esophagitis: Secondary | ICD-10-CM | POA: Diagnosis not present

## 2015-07-19 DIAGNOSIS — R079 Chest pain, unspecified: Secondary | ICD-10-CM | POA: Diagnosis not present

## 2015-07-19 LAB — BASIC METABOLIC PANEL
ANION GAP: 10 (ref 5–15)
BUN: 13 mg/dL (ref 6–20)
CALCIUM: 9.3 mg/dL (ref 8.9–10.3)
CO2: 25 mmol/L (ref 22–32)
Chloride: 105 mmol/L (ref 101–111)
Creatinine, Ser: 0.52 mg/dL (ref 0.44–1.00)
GFR calc Af Amer: >60 mL/min (ref >60)
GLUCOSE: 96 mg/dL (ref 65–99)
POTASSIUM: 4.1 mmol/L (ref 3.5–5.1)
SODIUM: 140 mmol/L (ref 135–145)

## 2015-07-19 LAB — I-STAT CHEM 8, ED
BUN: 12 mg/dL (ref 6–20)
CALCIUM ION: 1.25 mmol/L — AB (ref 1.12–1.23)
Chloride: 103 mmol/L (ref 101–111)
Creatinine, Ser: 0.6 mg/dL (ref 0.44–1.00)
Glucose, Bld: 94 mg/dL (ref 65–99)
HEMATOCRIT: 40 % (ref 36.0–46.0)
Hemoglobin: 13.6 g/dL (ref 12.0–15.0)
Potassium: 4.1 mmol/L (ref 3.5–5.1)
SODIUM: 138 mmol/L (ref 135–145)
TCO2: 23 mmol/L (ref 0–100)

## 2015-07-19 LAB — CBC
HEMATOCRIT: 35.9 % — AB (ref 36.0–46.0)
HEMOGLOBIN: 12.4 g/dL (ref 12.0–15.0)
MCH: 33.2 pg (ref 26.0–34.0)
MCHC: 34.5 g/dL (ref 30.0–36.0)
MCV: 96.2 fL (ref 78.0–100.0)
Platelets: 226 10*3/uL (ref 150–400)
RBC: 3.73 MIL/uL — ABNORMAL LOW (ref 3.87–5.11)
RDW: 12.8 % (ref 11.5–15.5)
WBC: 4.1 10*3/uL (ref 4.0–10.5)

## 2015-07-19 LAB — TROPONIN I

## 2015-07-19 LAB — I-STAT TROPONIN, ED: TROPONIN I, POC: 0 ng/mL (ref 0.00–0.08)

## 2015-07-19 LAB — D-DIMER, QUANTITATIVE (NOT AT ARMC)

## 2015-07-19 MED ORDER — PANTOPRAZOLE SODIUM 20 MG PO TBEC: 20.0000 mg | DELAYED_RELEASE_TABLET | Freq: Every day | ORAL | Status: DC

## 2015-07-19 MED ORDER — HYDROCODONE-ACETAMINOPHEN 5-325 MG PO TABS: 1.0000 | ORAL_TABLET | Freq: Four times a day (QID) | ORAL | Status: DC | PRN

## 2015-07-19 MED ORDER — PANTOPRAZOLE SODIUM 40 MG IV SOLR
40.0000 mg | Freq: Once | INTRAVENOUS | Status: AC
  Administered 2015-07-19: 40 mg via INTRAVENOUS
  Filled 2015-07-19: qty 40

## 2015-07-19 NOTE — ED Notes (Signed)
Pt was seen at 2 urgent care yesterday and was tod to come to ED.  Pt in today for continued chest pain rated 9/10.  C/o nausea, denies any other symptoms.

## 2015-07-19 NOTE — ED Notes (Signed)
Dr Roderic Palau in room with pt at this time

## 2015-07-19 NOTE — Discharge Instructions (Signed)
Follow up with your md this week for recheck  °

## 2015-07-21 NOTE — ED Provider Notes (Signed)
CSN: [id number]     Arrival date & time 07/19/15  1318 History   First MD Initiated Contact with Patient 07/19/15 1332     Chief Complaint  Patient presents with  . Chest Pain     (Consider location/radiation/quality/duration/timing/severity/associated sxs/prior Treatment) Patient is a 54 y.o. female presenting with chest pain. The history is provided by the patient (He states that she's been having chest pain left couple days. It's not any worse with. She has sharp pain left testicle last per minute.).  Chest Pain Pain location:  L chest Pain quality: aching   Pain radiates to:  Does not radiate Pain radiates to the back: no   Pain severity:  Mild Onset quality:  Sudden Timing:  Intermittent Associated symptoms: no abdominal pain, no back pain, no cough, no fatigue and no headache     Past Medical History  Diagnosis Date  . Hyperlipidemia   . Kidney stones   . Seizure ([place])   . GERD (gastroesophageal reflux disease)    Past Surgical History  Procedure Laterality Date  . Appendectomy    . Cervical fusion    . Cholecystectomy    . Tubal ligation      Bilateral  . Oophorectomy      Right   Family History  Problem Relation Age of Onset  . Diabetes Mother   . Heart disease Mother 56  . Colon cancer Neg Hx   . Prostate cancer Other    Social History  Substance Use Topics  . Smoking status: Former Research scientist (life sciences)  . Smokeless tobacco: Never Used  . Alcohol Use: No   OB History    No data available     Review of Systems  Constitutional: Negative for appetite change and fatigue.  HENT: Negative for congestion, ear discharge and sinus pressure.   Eyes: Negative for discharge.  Respiratory: Negative for cough.   Cardiovascular: Positive for chest pain.  Gastrointestinal: Negative for abdominal pain and diarrhea.  Genitourinary: Negative for frequency and hematuria.  Musculoskeletal: Negative for back pain.  Skin: Negative for rash.  Neurological: Negative for seizures  and headaches.  Psychiatric/Behavioral: Negative for hallucinations.      Allergies  Topiramate and Terbinafine hcl  Home Medications   Prior to Admission medications   Medication Sig Start Date End Date Taking? Authorizing Provider  carbamazepine (TEGRETOL) 200 MG tablet TAKE 1 TABLET (200 MG TOTAL) BY MOUTH 4 (FOUR) TIMES DAILY. 01/29/15   Wardell Honour, MD  DEPAKOTE 500 MG DR tablet Take 2 tablets (1,000 mg total) by mouth 2 (two) times daily. 01/29/15   Wardell Honour, MD  esomeprazole (NEXIUM) 40 MG capsule Take 1 capsule (40 mg total) by mouth daily. 01/29/15   Wardell Honour, MD  fish oil-omega-3 fatty acids 1000 MG capsule Take 1 capsule by mouth daily.      Historical Provider, MD  HYDROcodone-acetaminophen (NORCO/VICODIN) 5-325 MG tablet Take 1 tablet by mouth every 6 (six) hours as needed. 07/19/15   Milton Ferguson, MD  pantoprazole (PROTONIX) 20 MG tablet Take 1 tablet (20 mg total) by mouth daily. 07/19/15   Milton Ferguson, MD  zonisamide (ZONEGRAN) 100 MG capsule TAKE 1 CAPSULE (100 MG TOTAL) BY MOUTH DAILY. 01/29/15   Wardell Honour, MD   BP 123/66 mmHg  Pulse 59  Temp(Src) 97.8 F (36.6 C) (Oral)  Resp 18  Ht 5\' 5"  (1.651 m)  Wt 145 lb (65.772 kg)  BMI 24.13 kg/m2  SpO2 99% Physical Exam  Constitutional:  She is oriented to person, place, and time. She appears well-developed.  HENT:  Head: Normocephalic.  Eyes: Conjunctivae and EOM are normal. No scleral icterus.  Neck: Neck supple. No thyromegaly present.  Cardiovascular: Normal rate and regular rhythm.  Exam reveals no gallop and no friction rub.   No murmur heard. Pulmonary/Chest: No stridor. She has no wheezes. She has no rales. She exhibits no tenderness.  Abdominal: She exhibits no distension. There is no tenderness. There is no rebound.  Musculoskeletal: Normal range of motion. She exhibits no edema.  Lymphadenopathy:    She has no cervical adenopathy.  Neurological: She is oriented to person, place,  and time. She exhibits normal muscle tone. Coordination normal.  Skin: No rash noted. No erythema.  Psychiatric: She has a normal mood and affect. Her behavior is normal.    ED Course  Procedures (including critical care time) Labs Review Labs Reviewed  CBC - Abnormal; Notable for the following:    RBC 3.73 (*)    HCT 35.9 (*)    All other components within normal limits  I-STAT CHEM 8, ED - Abnormal; Notable for the following:    Calcium, Ion 1.25 (*)    All other components within normal limits  BASIC METABOLIC PANEL  TROPONIN I  D-DIMER, QUANTITATIVE (NOT AT [facility])  Randolm Idol, ED    Imaging Review Dg Chest 2 View  07/19/2015   CLINICAL DATA:  Chest pain since yesterday.  Nausea.  EXAM: CHEST  2 VIEW  COMPARISON:  None.  FINDINGS: The heart size and mediastinal contours are within normal limits. Both lungs are clear. The visualized skeletal structures are unremarkable.  IMPRESSION: No active cardiopulmonary disease.   Electronically Signed   By: Kathreen Devoid   On: 07/19/2015 14:37   I have personally reviewed and evaluated these images and lab results as part of my medical decision-making.   EKG Interpretation   Date/Time:  Sunday July 19 2015 13:23:59 EDT Ventricular Rate:  66 PR Interval:  130 QRS Duration: 72 QT Interval:  376 QTC Calculation: 394 R Axis:   25 Text Interpretation:  Normal sinus rhythm Normal ECG Confirmed by Shante Maysonet   MD, Rosaelena Kemnitz ([id number]) on 07/19/2015 1:34:11 PM      MDM   Final diagnoses:  Chest pain at rest    Labs EKG unremarkable. Patient without any risk factors for coronary disease. Chest x-ray normal. Patient will be sent home protonix. And will follow up with her family doctor this week.    Milton Ferguson, MD 07/21/15 [phone]

## 2015-07-23 ENCOUNTER — Ambulatory Visit (INDEPENDENT_AMBULATORY_CARE_PROVIDER_SITE_OTHER): Payer: Medicaid Other | Admitting: Family Medicine

## 2015-07-23 ENCOUNTER — Encounter: Payer: Self-pay | Admitting: Family Medicine

## 2015-07-23 VITALS — BP 137/82 | HR 64 | Temp 97.5°F | Ht 65.0 in | Wt 152.6 lb

## 2015-07-23 DIAGNOSIS — M940 Chondrocostal junction syndrome [Tietze]: Secondary | ICD-10-CM

## 2015-07-23 DIAGNOSIS — Z23 Encounter for immunization: Secondary | ICD-10-CM | POA: Diagnosis not present

## 2015-07-23 MED ORDER — MELOXICAM 15 MG PO TABS: 15.0000 mg | ORAL_TABLET | Freq: Every day | ORAL | Status: DC

## 2015-07-23 NOTE — Progress Notes (Signed)
   Subjective:    Patient ID: Savannah Shaw, female    DOB: [birth year], 54 y.o.   MRN: [id number]  HPI 54 year old female who awoke Saturday morning with some left-sided chest pain that felt like it was in her breast. She presented here in to the urgent care. She was not seen in either place but then was seen in the emergency room at [facility]. There are serial enzymes and EKGs d-dimer were all negative as she was sent home on a history of present illness. Her symptoms have persisted although may be a little better.  She really has no risk factors for heart disease she has had more nausea since that ER visit.  Patient Active Problem List   Diagnosis Date Noted  . Seizure disorder ([place]) 09/10/2013  . Dyspnea 07/06/2011  . Edema 07/06/2011  . Hyperlipidemia 07/06/2011  . ESOPHAGEAL REFLUX 10/19/2009  . DIARRHEA 09/15/2009  . ANEMIA, NORMOCYTIC 09/07/2008   Outpatient Encounter Prescriptions as of 07/23/2015  Medication Sig  . carbamazepine (TEGRETOL) 200 MG tablet TAKE 1 TABLET (200 MG TOTAL) BY MOUTH 4 (FOUR) TIMES DAILY.  Marland Kitchen DEPAKOTE 500 MG DR tablet Take 2 tablets (1,000 mg total) by mouth 2 (two) times daily.  . fish oil-omega-3 fatty acids 1000 MG capsule Take 1 capsule by mouth daily.    . pantoprazole (PROTONIX) 20 MG tablet Take 1 tablet (20 mg total) by mouth daily.  Marland Kitchen zonisamide (ZONEGRAN) 100 MG capsule TAKE 1 CAPSULE (100 MG TOTAL) BY MOUTH DAILY.  Marland Kitchen esomeprazole (NEXIUM) 40 MG capsule Take 1 capsule (40 mg total) by mouth daily. (Patient not taking: Reported on 07/23/2015)  . HYDROcodone-acetaminophen (NORCO/VICODIN) 5-325 MG tablet Take 1 tablet by mouth every 6 (six) hours as needed. (Patient not taking: Reported on 07/23/2015)   No facility-administered encounter medications on file as of 07/23/2015.      Review of Systems  Constitutional: Negative.   HENT: Negative.   Eyes: Negative.   Respiratory: Negative.   Cardiovascular: Positive for chest pain.  Genitourinary:  Negative.   Neurological: Negative.   Psychiatric/Behavioral: Negative.        Objective:   Physical Exam  Constitutional: She appears well-developed and well-nourished.  Cardiovascular: Normal rate and regular rhythm.   Pulmonary/Chest: Effort normal and breath sounds normal. She exhibits tenderness.  I think checks tenderness may be related to costochondritis since it seems to be in an area just to the left of the sternal          Assessment & Plan:  1. Costochondritis I think symptoms are most consistent with chest wall pain will treat her with meloxicam but to cover bases rule out pancreatitis with amylase and lipase - Amylase - Lipase  2. Encounter for immunization Flu shot  Wardell Honour MD

## 2015-07-24 LAB — LIPASE: LIPASE: 25 U/L (ref 0–59)

## 2015-07-24 LAB — AMYLASE: AMYLASE: 75 U/L (ref 31–124)

## 2015-08-15 ENCOUNTER — Other Ambulatory Visit: Payer: Self-pay | Admitting: Family Medicine

## 2015-08-25 ENCOUNTER — Other Ambulatory Visit: Payer: Self-pay | Admitting: Family Medicine

## 2015-08-26 NOTE — Telephone Encounter (Signed)
[place] pt, I can't order this med

## 2015-08-31 ENCOUNTER — Telehealth: Payer: Self-pay | Admitting: Family Medicine

## 2015-08-31 NOTE — Telephone Encounter (Signed)
Done per Debbi

## 2015-09-29 ENCOUNTER — Encounter: Payer: Self-pay | Admitting: Family Medicine

## 2015-09-29 ENCOUNTER — Ambulatory Visit (INDEPENDENT_AMBULATORY_CARE_PROVIDER_SITE_OTHER): Payer: Medicaid Other | Admitting: Family Medicine

## 2015-09-29 VITALS — BP 120/77 | HR 76 | Temp 97.1°F | Ht 65.0 in | Wt 151.2 lb

## 2015-09-29 DIAGNOSIS — R112 Nausea with vomiting, unspecified: Secondary | ICD-10-CM

## 2015-09-29 DIAGNOSIS — R079 Chest pain, unspecified: Secondary | ICD-10-CM

## 2015-09-29 MED ORDER — SUCRALFATE 1 G PO TABS: 1.0000 g | ORAL_TABLET | Freq: Three times a day (TID) | ORAL | Status: DC

## 2015-09-29 MED ORDER — ONDANSETRON 4 MG PO TBDP: 4.0000 mg | ORAL_TABLET | Freq: Three times a day (TID) | ORAL | Status: DC | PRN

## 2015-09-29 NOTE — Patient Instructions (Signed)
Great to meet you!  PLease be very careful, if anything concerning that we talked about (also below) happens please go to the ER.   Nonspecific Chest Pain  Chest pain can be caused by many different conditions. There is always a chance that your pain could be related to something serious, such as a heart attack or a blood clot in your lungs. Chest pain can also be caused by conditions that are not life-threatening. If you have chest pain, it is very important to follow up with your health care provider. CAUSES  Chest pain can be caused by:  Heartburn.  Pneumonia or bronchitis.  Anxiety or stress.  Inflammation around your heart (pericarditis) or lung (pleuritis or pleurisy).  A blood clot in your lung.  A collapsed lung (pneumothorax). It can develop suddenly on its own (spontaneous pneumothorax) or from trauma to the chest.  Shingles infection (varicella-zoster virus).  Heart attack.  Damage to the bones, muscles, and cartilage that make up your chest wall. This can include:  Bruised bones due to injury.  Strained muscles or cartilage due to frequent or repeated coughing or overwork.  Fracture to one or more ribs.  Sore cartilage due to inflammation (costochondritis). RISK FACTORS  Risk factors for chest pain may include:  Activities that increase your risk for trauma or injury to your chest.  Respiratory infections or conditions that cause frequent coughing.  Medical conditions or overeating that can cause heartburn.  Heart disease or family history of heart disease.  Conditions or health behaviors that increase your risk of developing a blood clot.  Having had chicken pox (varicella zoster). SIGNS AND SYMPTOMS Chest pain can feel like:  Burning or tingling on the surface of your chest or deep in your chest.  Crushing, pressure, aching, or squeezing pain.  Dull or sharp pain that is worse when you move, cough, or take a deep breath.  Pain that is also felt  in your back, neck, shoulder, or arm, or pain that spreads to any of these areas. Your chest pain may come and go, or it may stay constant. DIAGNOSIS Lab tests or other studies may be needed to find the cause of your pain. Your health care provider may have you take a test called an ambulatory ECG (electrocardiogram). An ECG records your heartbeat patterns at the time the test is performed. You may also have other tests, such as:  Transthoracic echocardiogram (TTE). During echocardiography, sound waves are used to create a picture of all of the heart structures and to look at how blood flows through your heart.  Transesophageal echocardiogram (TEE).This is a more advanced imaging test that obtains images from inside your body. It allows your health care provider to see your heart in finer detail.  Cardiac monitoring. This allows your health care provider to monitor your heart rate and rhythm in real time.  Holter monitor. This is a portable device that records your heartbeat and can help to diagnose abnormal heartbeats. It allows your health care provider to track your heart activity for several days, if needed.  Stress tests. These can be done through exercise or by taking medicine that makes your heart beat more quickly.  Blood tests.  Imaging tests. TREATMENT  Your treatment depends on what is causing your chest pain. Treatment may include:  Medicines. These may include:  Acid blockers for heartburn.  Anti-inflammatory medicine.  Pain medicine for inflammatory conditions.  Antibiotic medicine, if an infection is present.  Medicines to dissolve blood  clots.  Medicines to treat coronary artery disease.  Supportive care for conditions that do not require medicines. This may include:  Resting.  Applying heat or cold packs to injured areas.  Limiting activities until pain decreases. HOME CARE INSTRUCTIONS  If you were prescribed an antibiotic medicine, finish it all even if  you start to feel better.  Avoid any activities that bring on chest pain.  Do not use any tobacco products, including cigarettes, chewing tobacco, or electronic cigarettes. If you need help quitting, ask your health care provider.  Do not drink alcohol.  Take medicines only as directed by your health care provider.  Keep all follow-up visits as directed by your health care provider. This is important. This includes any further testing if your chest pain does not go away.  If heartburn is the cause for your chest pain, you may be told to keep your head raised (elevated) while sleeping. This reduces the chance that acid will go from your stomach into your esophagus.  Make lifestyle changes as directed by your health care provider. These may include:  Getting regular exercise. Ask your health care provider to suggest some activities that are safe for you.  Eating a heart-healthy diet. A registered dietitian can help you to learn healthy eating options.  Maintaining a healthy weight.  Managing diabetes, if necessary.  Reducing stress. SEEK MEDICAL CARE IF:  Your chest pain does not go away after treatment.  You have a rash with blisters on your chest.  You have a fever. SEEK IMMEDIATE MEDICAL CARE IF:   Your chest pain is worse.  You have an increasing cough, or you cough up blood.  You have severe abdominal pain.  You have severe weakness.  You faint.  You have chills.  You have sudden, unexplained chest discomfort.  You have sudden, unexplained discomfort in your arms, back, neck, or jaw.  You have shortness of breath at any time.  You suddenly start to sweat, or your skin gets clammy.  You feel nauseous or you vomit.  You suddenly feel light-headed or dizzy.  Your heart begins to beat quickly, or it feels like it is skipping beats. These symptoms may represent a serious problem that is an emergency. Do not wait to see if the symptoms will go away. Get medical  help right away. Call your local emergency services (911 in the [place] Do not drive yourself to the hospital.   This information is not intended to replace advice given to you by your health care provider. Make sure you discuss any questions you have with your health care provider.   Document Released: 07/13/2005 Document Revised: 10/24/2014 Document Reviewed: 05/09/2014 Elsevier Interactive Patient Education [employer].

## 2015-09-29 NOTE — Progress Notes (Signed)
   HPI  Patient presents today today with chest pain.  Patient explains that she's had 3 days of chest pain with associated nausea.  Patient explains that she had previous episodes similar to this with slightly different character of pain in October. She describes initially burning type pain which intermittent sharp pains,  Today the burning type pain has changed and she has a continuous pressure type pain with intermittent sharp pain.  She also has nausea, no emesis.  She denies dyspnea, it is not exertional it is non-radiating.  She has improvement with GI cocktail   PMH: Smoking status noted ROS: Per HPI  Objective: BP 120/77 mmHg  Pulse 76  Temp(Src) 97.1 F (36.2 C) (Oral)  Ht 5\' 5"  (1.651 m)  Wt 151 lb 3.2 oz (68.584 kg)  BMI 25.16 kg/m2 Gen: NAD, alert, cooperative with exam HEENT: NCAT Chest wall: Some pain with palpation of chest wall, however it does not reproduce pain (chaperone present) CV: RRR, good S1/S2, no murmur Resp: CTABL, no wheezes, non-labored Abd: SNTND, BS present, no guarding or organomegaly Ext: No edema, warm Neuro: Alert and oriented, No gross deficits  EKG - NSR, no acute changes  Assessment and plan:  # Chest pain, nausea Atypical chest pain for 3 days with nausea. EKG with no acute findings and no changes from the similar episode about 2 months ago. She does have improvement with a GI cocktail soI have encouraged her to continue her PPI, also adding sucralfate She does have somepain with palpation of the left sternal border however does not reproduce the pain. Refer to cardiology with 2 episodes of atypical chest pain I discussed with her extremely low threshold for seeking emergent medical care and she agrees to this, I outlined several red flags which have provided in her AVS     Orders Placed This Encounter  Procedures  . EKG 12-Lead    Laroy Apple, MD [facility] 09/29/2015, 3:54 PM

## 2015-10-21 ENCOUNTER — Other Ambulatory Visit: Payer: Self-pay | Admitting: Family Medicine

## 2015-11-16 ENCOUNTER — Encounter: Payer: Self-pay | Admitting: Cardiology

## 2015-11-25 ENCOUNTER — Telehealth: Payer: Self-pay | Admitting: *Deleted

## 2015-11-25 ENCOUNTER — Encounter: Payer: Self-pay | Admitting: Cardiology

## 2015-11-25 ENCOUNTER — Ambulatory Visit (INDEPENDENT_AMBULATORY_CARE_PROVIDER_SITE_OTHER): Payer: Medicaid Other | Admitting: Cardiology

## 2015-11-25 VITALS — BP 128/74 | HR 72 | Ht 65.0 in | Wt 156.0 lb

## 2015-11-25 DIAGNOSIS — E785 Hyperlipidemia, unspecified: Secondary | ICD-10-CM | POA: Diagnosis not present

## 2015-11-25 DIAGNOSIS — R079 Chest pain, unspecified: Secondary | ICD-10-CM | POA: Diagnosis not present

## 2015-11-25 NOTE — Patient Instructions (Signed)
Medication Instructions:  The current medical regimen is effective;  continue present plan and medications.  Labwork: Please have fasting blood work at the same time as the stress test.  Testing/Procedures: Your physician has requested that you have an exercise tolerance test. For further information please visit [email or web address]. Please also follow instruction sheet, as given.  Follow-Up: Follow up will be as needed.  If you need a refill on your cardiac medications before your next appointment, please call your pharmacy.  Thank you for choosing [facility]!!

## 2015-11-25 NOTE — Progress Notes (Signed)
Cardiology Office Note   Date:  11/26/2015   ID:  Savannah Shaw, DOB [birth year], MRN [id number]  PCP:  Wardell Honour, MD  Cardiologist:   Minus Breeding, MD   Chief Complaint  Patient presents with  . Chest Pain      History of Present Illness: Savannah Shaw is a 55 y.o. female who presents for evaluation of chest discomfort. This really started in the fall of last year. In fact she was in the emergency room and I did review these records. This was in October. She had another episode in December and saw her primary care physician. She described these as left side and under the left breast. It was a burning discomfort. She doesn't remember any radiation. She was a little nauseated but doesn't remember other associated symptoms. This was moderately severe and she did not have before. In the emergency room there was no objective evidence of ischemia. She was treated eventually with sucralfate and seemed to see some improvement with this. She has noted some associations with foods. She walks 20 stairs routinely to her apartment and doesn't bring on the symptoms. She does not have any new shortness of breath, PND or orthopnea. She does not have any new palpitations, presyncope or syncope. Of note she did have stress testing in 2012 which was negative for any evidence of ischemia. She does have a family history of heart problems in early age although I don't have specific details on this. She's had some dyslipidemia as well. She was a former smoker.   Past Medical History  Diagnosis Date  . Hyperlipidemia   . Kidney stones   . Seizure ([place])   . GERD (gastroesophageal reflux disease)     Past Surgical History  Procedure Laterality Date  . Appendectomy    . Cervical fusion    . Cholecystectomy    . Tubal ligation      Bilateral  . Oophorectomy      Right     Current Outpatient Prescriptions  Medication Sig Dispense Refill  . b complex vitamins tablet Take 1 tablet by mouth  daily.    . carbamazepine (TEGRETOL) 200 MG tablet TAKE 1 TABLET (200 MG TOTAL) BY MOUTH 4 (FOUR) TIMES DAILY. 120 tablet 6  . DEPAKOTE 500 MG DR tablet Take 2 tablets (1,000 mg total) by mouth 2 (two) times daily. 120 tablet 6  . fish oil-omega-3 fatty acids 1000 MG capsule Take 1 capsule by mouth daily.      . pantoprazole (PROTONIX) 20 MG tablet TAKE 1 TABLET DAILY 30 tablet 5  . zonisamide (ZONEGRAN) 100 MG capsule TAKE 1 CAPSULE (100 MG TOTAL) BY MOUTH DAILY. 30 capsule 5   No current facility-administered medications for this visit.    Allergies:   Topiramate and Terbinafine hcl    Social History:  The patient  reports that she has quit smoking. She has never used smokeless tobacco. She reports that she does not drink alcohol or use illicit drugs.   Family History:  The patient's family history includes Diabetes in her mother; Heart disease (age of onset: 65) in her mother; Prostate cancer in her other. There is no history of Colon cancer.    ROS:  Please see the history of present illness.   Otherwise, review of systems are positive for none.   All other systems are reviewed and negative.    PHYSICAL EXAM: VS:  BP 128/74 mmHg  Pulse 72  Ht 5\' 5"  (  1.651 m)  Wt 156 lb (70.761 kg)  BMI 25.96 kg/m2  SpO2 99% , BMI Body mass index is 25.96 kg/(m^2). GENERAL:  Well appearing HEENT:  Pupils equal round and reactive, fundi not visualized, oral mucosa unremarkable NECK:  No jugular venous distention, waveform within normal limits, carotid upstroke brisk and symmetric, no bruits, no thyromegaly LYMPHATICS:  No cervical, inguinal adenopathy LUNGS:  Clear to auscultation bilaterally BACK:  No CVA tenderness CHEST:  Unremarkable HEART:  PMI not displaced or sustained,S1 and S2 within normal limits, no S3, no S4, no clicks, no rubs, no murmurs ABD:  Flat, positive bowel sounds normal in frequency in pitch, no bruits, no rebound, no guarding, no midline pulsatile mass, no hepatomegaly, no  splenomegaly EXT:  2 plus pulses throughout, no edema, no cyanosis no clubbing SKIN:  No rashes no nodules NEURO:  Cranial nerves II through XII grossly intact, motor grossly intact throughout PSYCH:  Cognitively intact, oriented to person place and time    EKG:  EKG is not ordered today. The ekg ordered 12/13/16demonstrates sinus rhythm, rate 68, axis within normal limits, intervals within normal limits, no acute ST-T wave changes.   Recent Labs: 07/19/2015: BUN 12; Creatinine, Ser 0.60; Hemoglobin 13.6; Platelets 226; Potassium 4.1; Sodium 138    Lipid Panel    Component Value Date/Time   CHOL 239* 03/07/2013 1351   TRIG 93 03/07/2013 1351   HDL 77 03/07/2013 1351   LDLCALC 143* 03/07/2013 1351      Wt Readings from Last 3 Encounters:  11/25/15 156 lb (70.761 kg)  09/29/15 151 lb 3.2 oz (68.584 kg)  07/23/15 152 lb 9.6 oz (69.219 kg)      Other studies Reviewed: Additional studies/ records that were reviewed today include: ED records. Review of the above records demonstrates:  Please see elsewhere in the note.     ASSESSMENT AND PLAN:  CHEST PAIN:  This most likely represents reflux. However, she has significant cardiovascular risk factors. I will bring the patient back for a POET (Plain Old Exercise Test). This will allow me to screen for obstructive coronary disease, risk stratify and very importantly provide a prescription for exercise.  DYSLIPIDEMIA:  I did review her lipids from 2014. Her LDL was 143 although her HDL was excellent. She should have this repeated.  RISK REDUCTION:  We talked about a strategy of increasing her exercise.   Current medicines are reviewed at length with the patient today.  The patient does not have concerns regarding medicines.  The following changes have been made:  no change  Labs/ tests ordered today include:   Orders Placed This Encounter  Procedures  . Lipid panel  . Exercise Tolerance Test     Disposition:   FU with  me as needed.      Signed, Minus Breeding, MD  11/26/2015 9:09 AM    [facility]

## 2015-11-25 NOTE — Telephone Encounter (Signed)
Detailed message left for patient to call back about scheduling treadmill

## 2015-11-26 ENCOUNTER — Encounter: Payer: Self-pay | Admitting: Cardiology

## 2015-12-21 ENCOUNTER — Other Ambulatory Visit: Payer: Self-pay | Admitting: Family Medicine

## 2015-12-24 ENCOUNTER — Encounter (INDEPENDENT_AMBULATORY_CARE_PROVIDER_SITE_OTHER): Payer: Medicaid Other

## 2015-12-24 ENCOUNTER — Other Ambulatory Visit: Payer: Self-pay | Admitting: *Deleted

## 2015-12-24 DIAGNOSIS — R079 Chest pain, unspecified: Secondary | ICD-10-CM

## 2015-12-24 DIAGNOSIS — E785 Hyperlipidemia, unspecified: Secondary | ICD-10-CM | POA: Diagnosis not present

## 2015-12-24 MED ORDER — SUCRALFATE 1 GM/10ML PO SUSP: 1.0000 g | Freq: Three times a day (TID) | ORAL | Status: DC

## 2015-12-25 LAB — LIPID PANEL
Chol/HDL Ratio: 2.8 ratio units (ref 0.0–4.4)
Cholesterol, Total: 226 mg/dL — ABNORMAL HIGH (ref 100–199)
HDL: 80 mg/dL (ref >39)
LDL Calculated: 125 mg/dL — ABNORMAL HIGH (ref 0–99)
Triglycerides: 103 mg/dL (ref 0–149)
VLDL Cholesterol Cal: 21 mg/dL (ref 5–40)

## 2015-12-26 LAB — EXERCISE TOLERANCE TEST
CSEPED: 7 min
CSEPPHR: 142 {beats}/min
Estimated workload: 7.1 METS
Exercise duration (sec): 29 s
MPHR: 166 {beats}/min
Percent HR: 85 %
RPE: 9
Rest HR: 71 {beats}/min

## 2015-12-31 ENCOUNTER — Emergency Department (HOSPITAL_COMMUNITY): Payer: Medicaid Other

## 2015-12-31 ENCOUNTER — Encounter (HOSPITAL_COMMUNITY): Payer: Self-pay | Admitting: *Deleted

## 2015-12-31 ENCOUNTER — Observation Stay (HOSPITAL_COMMUNITY)
Admission: EM | Admit: 2015-12-31 | Discharge: 2016-01-02 | Disposition: A | Discharge status: Home / Self Care | Payer: Medicaid Other | Attending: Urology | Admitting: Urology

## 2015-12-31 ENCOUNTER — Emergency Department (HOSPITAL_COMMUNITY): Payer: Medicaid Other | Admitting: Anesthesiology

## 2015-12-31 ENCOUNTER — Encounter (HOSPITAL_COMMUNITY)
Admission: EM | Disposition: A | Discharge status: Home / Self Care | Payer: Self-pay | Source: Home / Self Care | Attending: Emergency Medicine

## 2015-12-31 DIAGNOSIS — D72829 Elevated white blood cell count, unspecified: Secondary | ICD-10-CM | POA: Insufficient documentation

## 2015-12-31 DIAGNOSIS — K219 Gastro-esophageal reflux disease without esophagitis: Secondary | ICD-10-CM | POA: Insufficient documentation

## 2015-12-31 DIAGNOSIS — R1030 Lower abdominal pain, unspecified: Secondary | ICD-10-CM | POA: Diagnosis present

## 2015-12-31 DIAGNOSIS — Z87891 Personal history of nicotine dependence: Secondary | ICD-10-CM | POA: Diagnosis not present

## 2015-12-31 DIAGNOSIS — N201 Calculus of ureter: Secondary | ICD-10-CM

## 2015-12-31 DIAGNOSIS — A419 Sepsis, unspecified organism: Secondary | ICD-10-CM | POA: Insufficient documentation

## 2015-12-31 DIAGNOSIS — N390 Urinary tract infection, site not specified: Secondary | ICD-10-CM

## 2015-12-31 DIAGNOSIS — Z79899 Other long term (current) drug therapy: Secondary | ICD-10-CM | POA: Insufficient documentation

## 2015-12-31 DIAGNOSIS — N132 Hydronephrosis with renal and ureteral calculous obstruction: Principal | ICD-10-CM | POA: Insufficient documentation

## 2015-12-31 DIAGNOSIS — R569 Unspecified convulsions: Secondary | ICD-10-CM | POA: Diagnosis not present

## 2015-12-31 DIAGNOSIS — Z87442 Personal history of urinary calculi: Secondary | ICD-10-CM | POA: Diagnosis not present

## 2015-12-31 HISTORY — PX: CYSTOSCOPY W/ URETERAL STENT PLACEMENT: SHX1429

## 2015-12-31 LAB — URINALYSIS, ROUTINE W REFLEX MICROSCOPIC
Bilirubin Urine: NEGATIVE
Glucose, UA: NEGATIVE mg/dL
Ketones, ur: 15 mg/dL — AB
Nitrite: POSITIVE — AB
Specific Gravity, Urine: 1.02 (ref 1.005–1.030)
pH: 5.5 (ref 5.0–8.0)

## 2015-12-31 LAB — CBC WITH DIFFERENTIAL/PLATELET
Basophils Absolute: 0 10*3/uL (ref 0.0–0.1)
Basophils Relative: 0 %
Eosinophils Absolute: 0 10*3/uL (ref 0.0–0.7)
Eosinophils Relative: 0 %
HCT: 38.8 % (ref 36.0–46.0)
Hemoglobin: 13.4 g/dL (ref 12.0–15.0)
Lymphocytes Relative: 8 %
Lymphs Abs: 1 10*3/uL (ref 0.7–4.0)
MCH: 33.6 pg (ref 26.0–34.0)
MCHC: 34.5 g/dL (ref 30.0–36.0)
MCV: 97.2 fL (ref 78.0–100.0)
Monocytes Absolute: 0.8 10*3/uL (ref 0.1–1.0)
Monocytes Relative: 7 %
Neutro Abs: 10.6 10*3/uL — ABNORMAL HIGH (ref 1.7–7.7)
Neutrophils Relative %: 85 %
Platelets: 265 10*3/uL (ref 150–400)
RBC: 3.99 MIL/uL (ref 3.87–5.11)
RDW: 12.7 % (ref 11.5–15.5)
WBC: 12.4 10*3/uL — ABNORMAL HIGH (ref 4.0–10.5)

## 2015-12-31 LAB — BASIC METABOLIC PANEL
Anion gap: 7 (ref 5–15)
BUN: 12 mg/dL (ref 6–20)
CO2: 24 mmol/L (ref 22–32)
Calcium: 8.5 mg/dL — ABNORMAL LOW (ref 8.9–10.3)
Chloride: 107 mmol/L (ref 101–111)
Creatinine, Ser: 0.63 mg/dL (ref 0.44–1.00)
GFR calc Af Amer: >60 mL/min (ref >60)
GFR calc non Af Amer: >60 mL/min (ref >60)
Glucose, Bld: 109 mg/dL — ABNORMAL HIGH (ref 65–99)
Potassium: 3.8 mmol/L (ref 3.5–5.1)
Sodium: 138 mmol/L (ref 135–145)

## 2015-12-31 LAB — URINE MICROSCOPIC-ADD ON

## 2015-12-31 SURGERY — CYSTOSCOPY, WITH RETROGRADE PYELOGRAM AND URETERAL STENT INSERTION
Anesthesia: General | Site: Ureter | Laterality: Left

## 2015-12-31 MED ORDER — KETOROLAC TROMETHAMINE 30 MG/ML IJ SOLN: 30.0000 mg | Freq: Once | INTRAMUSCULAR | Status: DC | PRN

## 2015-12-31 MED ORDER — LACTATED RINGERS IV SOLN
INTRAVENOUS | Status: DC | PRN
  Administered 2015-12-31: 20:00:00 via INTRAVENOUS

## 2015-12-31 MED ORDER — ACETAMINOPHEN 325 MG PO TABS
650.0000 mg | ORAL_TABLET | ORAL | Status: DC | PRN
  Administered 2016-01-01 – 2016-01-02 (×3): 650 mg via ORAL
  Filled 2015-12-31 (×3): qty 2

## 2015-12-31 MED ORDER — ONDANSETRON HCL 4 MG/2ML IJ SOLN: 4.0000 mg | INTRAMUSCULAR | Status: DC | PRN

## 2015-12-31 MED ORDER — KETOROLAC TROMETHAMINE 30 MG/ML IJ SOLN
15.0000 mg | Freq: Once | INTRAMUSCULAR | Status: AC
  Administered 2015-12-31: 15 mg via INTRAVENOUS
  Filled 2015-12-31: qty 1

## 2015-12-31 MED ORDER — DIPHENHYDRAMINE HCL 12.5 MG/5ML PO ELIX: 12.5000 mg | ORAL_SOLUTION | Freq: Four times a day (QID) | ORAL | Status: DC | PRN

## 2015-12-31 MED ORDER — OXYCODONE HCL 5 MG/5ML PO SOLN: 5.0000 mg | Freq: Once | ORAL | Status: DC | PRN

## 2015-12-31 MED ORDER — ZONISAMIDE 100 MG PO CAPS
100.0000 mg | ORAL_CAPSULE | Freq: Every day | ORAL | Status: DC
  Administered 2016-01-01 – 2016-01-02 (×2): 100 mg via ORAL
  Filled 2015-12-31 (×2): qty 1

## 2015-12-31 MED ORDER — CARBAMAZEPINE 200 MG PO TABS
200.0000 mg | ORAL_TABLET | Freq: Four times a day (QID) | ORAL | Status: DC
  Administered 2015-12-31 – 2016-01-02 (×6): 200 mg via ORAL
  Filled 2015-12-31 (×9): qty 1

## 2015-12-31 MED ORDER — DEXTROSE 5 % IV SOLN
1.0000 g | Freq: Once | INTRAVENOUS | Status: AC
  Administered 2015-12-31: 1 g via INTRAVENOUS
  Filled 2015-12-31: qty 10

## 2015-12-31 MED ORDER — SUCRALFATE 1 GM/10ML PO SUSP
1.0000 g | Freq: Three times a day (TID) | ORAL | Status: DC
  Administered 2016-01-01: 1 g via ORAL
  Filled 2015-12-31 (×4): qty 10

## 2015-12-31 MED ORDER — OXYCODONE HCL 5 MG PO TABS: 5.0000 mg | ORAL_TABLET | Freq: Once | ORAL | Status: DC | PRN

## 2015-12-31 MED ORDER — IOHEXOL 300 MG/ML  SOLN
INTRAMUSCULAR | Status: DC | PRN
  Administered 2015-12-31: 7 mL via URETHRAL

## 2015-12-31 MED ORDER — IOHEXOL 300 MG/ML  SOLN
100.0000 mL | Freq: Once | INTRAMUSCULAR | Status: AC | PRN
  Administered 2015-12-31: 100 mL via INTRAVENOUS

## 2015-12-31 MED ORDER — MIDAZOLAM HCL 5 MG/5ML IJ SOLN
INTRAMUSCULAR | Status: DC | PRN
  Administered 2015-12-31: 2 mg via INTRAVENOUS

## 2015-12-31 MED ORDER — PROMETHAZINE HCL 25 MG/ML IJ SOLN: 6.2500 mg | INTRAMUSCULAR | Status: DC | PRN

## 2015-12-31 MED ORDER — CEFTRIAXONE SODIUM 1 G IJ SOLR: 1.0000 g | INTRAMUSCULAR | Status: DC

## 2015-12-31 MED ORDER — 0.9 % SODIUM CHLORIDE (POUR BTL) OPTIME
TOPICAL | Status: DC | PRN
  Administered 2015-12-31: 1000 mL

## 2015-12-31 MED ORDER — STERILE WATER FOR IRRIGATION IR SOLN
Status: DC | PRN
  Administered 2015-12-31: 10 mL

## 2015-12-31 MED ORDER — ONDANSETRON HCL 4 MG/2ML IJ SOLN
INTRAMUSCULAR | Status: DC | PRN
Start: 2015-12-31 — End: 2015-12-31
  Administered 2015-12-31: 4 mg via INTRAVENOUS

## 2015-12-31 MED ORDER — OXYCODONE HCL 5 MG PO TABS
5.0000 mg | ORAL_TABLET | ORAL | Status: DC | PRN
  Administered 2016-01-01 – 2016-01-02 (×4): 5 mg via ORAL
  Filled 2015-12-31 (×4): qty 1

## 2015-12-31 MED ORDER — MORPHINE SULFATE (PF) 4 MG/ML IV SOLN
6.0000 mg | Freq: Once | INTRAVENOUS | Status: AC
  Administered 2015-12-31: 6 mg via INTRAVENOUS
  Filled 2015-12-31: qty 2

## 2015-12-31 MED ORDER — SUCCINYLCHOLINE CHLORIDE 20 MG/ML IJ SOLN
INTRAMUSCULAR | Status: DC | PRN
  Administered 2015-12-31: 100 mg via INTRAVENOUS

## 2015-12-31 MED ORDER — HYOSCYAMINE SULFATE 0.125 MG SL SUBL
0.1250 mg | SUBLINGUAL_TABLET | SUBLINGUAL | Status: DC | PRN
  Filled 2015-12-31: qty 1

## 2015-12-31 MED ORDER — LACTATED RINGERS IV SOLN: INTRAVENOUS | Status: DC

## 2015-12-31 MED ORDER — FENTANYL CITRATE (PF) 100 MCG/2ML IJ SOLN
INTRAMUSCULAR | Status: DC | PRN
  Administered 2015-12-31 (×2): 50 ug via INTRAVENOUS

## 2015-12-31 MED ORDER — ONDANSETRON HCL 4 MG/2ML IJ SOLN
4.0000 mg | Freq: Once | INTRAMUSCULAR | Status: AC
  Administered 2015-12-31: 4 mg via INTRAVENOUS
  Filled 2015-12-31: qty 2

## 2015-12-31 MED ORDER — HYDROMORPHONE HCL 1 MG/ML IJ SOLN
0.5000 mg | INTRAMUSCULAR | Status: DC | PRN
  Administered 2016-01-01 (×2): 1 mg via INTRAVENOUS
  Filled 2015-12-31 (×2): qty 1

## 2015-12-31 MED ORDER — DEXAMETHASONE SODIUM PHOSPHATE 10 MG/ML IJ SOLN
INTRAMUSCULAR | Status: DC | PRN
  Administered 2015-12-31: 10 mg via INTRAVENOUS

## 2015-12-31 MED ORDER — PROPOFOL 10 MG/ML IV BOLUS
INTRAVENOUS | Status: DC | PRN
  Administered 2015-12-31: 125 mg via INTRAVENOUS

## 2015-12-31 MED ORDER — FENTANYL CITRATE (PF) 100 MCG/2ML IJ SOLN: 25.0000 ug | INTRAMUSCULAR | Status: DC | PRN

## 2015-12-31 MED ORDER — BELLADONNA ALKALOIDS-OPIUM 16.2-60 MG RE SUPP
RECTAL | Status: AC
  Filled 2015-12-31: qty 1

## 2015-12-31 MED ORDER — DIVALPROEX SODIUM 250 MG PO DR TAB
1000.0000 mg | DELAYED_RELEASE_TABLET | Freq: Two times a day (BID) | ORAL | Status: DC
  Administered 2015-12-31 – 2016-01-02 (×4): 1000 mg via ORAL
  Filled 2015-12-31 (×4): qty 4

## 2015-12-31 MED ORDER — SODIUM CHLORIDE 0.9 % IV BOLUS (SEPSIS)
1000.0000 mL | Freq: Once | INTRAVENOUS | Status: AC
  Administered 2015-12-31: 1000 mL via INTRAVENOUS

## 2015-12-31 MED ORDER — LIDOCAINE HCL (CARDIAC) 20 MG/ML IV SOLN
INTRAVENOUS | Status: DC | PRN
  Administered 2015-12-31: 100 mg via INTRAVENOUS

## 2015-12-31 MED ORDER — MEPERIDINE HCL 50 MG/ML IJ SOLN
INTRAMUSCULAR | Status: AC
Start: 2015-12-31 — End: 2016-01-01
  Filled 2015-12-31: qty 1

## 2015-12-31 MED ORDER — SODIUM CHLORIDE 0.9 % IR SOLN
Status: DC | PRN
  Administered 2015-12-31: 4000 mL

## 2015-12-31 MED ORDER — DIPHENHYDRAMINE HCL 50 MG/ML IJ SOLN: 12.5000 mg | Freq: Four times a day (QID) | INTRAMUSCULAR | Status: DC | PRN

## 2015-12-31 MED ORDER — SODIUM CHLORIDE 0.9 % IV SOLN
INTRAVENOUS | Status: DC
  Administered 2015-12-31 – 2016-01-01 (×4): via INTRAVENOUS

## 2015-12-31 MED ORDER — PANTOPRAZOLE SODIUM 20 MG PO TBEC
20.0000 mg | DELAYED_RELEASE_TABLET | Freq: Every day | ORAL | Status: DC
  Administered 2016-01-01 – 2016-01-02 (×2): 20 mg via ORAL
  Filled 2015-12-31 (×2): qty 1

## 2015-12-31 MED ORDER — MEPERIDINE HCL 25 MG/ML IJ SOLN
INTRAMUSCULAR | Status: DC | PRN
  Administered 2015-12-31: 12.5 mg via INTRAVENOUS

## 2015-12-31 SURGICAL SUPPLY — 20 items
BAG URINE DRAINAGE (UROLOGICAL SUPPLIES) ×2 IMPLANT
BAG URO CATCHER STRL LF (MISCELLANEOUS) ×3 IMPLANT
BASKET LASER NITINOL 1.9FR (BASKET) IMPLANT
BASKET STNLS GEMINI 4WIRE 3FR (BASKET) IMPLANT
BSKT STON RTRVL 120 1.9FR (BASKET)
BSKT STON RTRVL GEM 120X11 3FR (BASKET)
CATH FOLEY 2WAY SLVR  5CC 16FR (CATHETERS) ×2
CATH FOLEY 2WAY SLVR 5CC 16FR (CATHETERS) IMPLANT
CATH INTERMIT  6FR 70CM (CATHETERS) ×2 IMPLANT
GLOVE BIO SURGEON STRL SZ8 (GLOVE) ×3 IMPLANT
GOWN STRL REUS W/TWL LRG LVL3 (GOWN DISPOSABLE) ×3 IMPLANT
GUIDEWIRE ANG ZIPWIRE 038X150 (WIRE) ×3 IMPLANT
GUIDEWIRE STR DUAL SENSOR (WIRE) ×3 IMPLANT
MANIFOLD NEPTUNE II (INSTRUMENTS) ×3 IMPLANT
PACK CYSTO (CUSTOM PROCEDURE TRAY) ×3 IMPLANT
STENT URET 6FRX26 CONTOUR (STENTS) ×2 IMPLANT
SYR CONTROL 10ML LL (SYRINGE) ×2 IMPLANT
TUBE FEEDING 8FR 16IN STR KANG (MISCELLANEOUS) IMPLANT
TUBING CONNECTING 10 (TUBING) ×2 IMPLANT
TUBING CONNECTING 10' (TUBING) ×1

## 2015-12-31 NOTE — ED Notes (Signed)
MD at bedside. 

## 2015-12-31 NOTE — Brief Op Note (Signed)
12/31/2015  9:29 PM  PATIENT:  Savannah Shaw  55 y.o. female  PRE-OPERATIVE DIAGNOSIS:  left ureteral stone sepsis  POST-OPERATIVE DIAGNOSIS:  same as preoperative diagnosis  PROCEDURE:  Procedure(s): CYSTOSCOPY WITH RETROGRADE PYELOGRAM/URETERAL STENT PLACEMENT (Left)  SURGEON:  Surgeon(s) and Role:    * Cleon Gustin, MD - Primary  PHYSICIAN ASSISTANT:   ASSISTANTS: none   ANESTHESIA:   general  EBL:     BLOOD ADMINISTERED:none  DRAINS: Urinary Catheter (Foley), left 6x26 JJ ureteral stent without tether  LOCAL MEDICATIONS USED:  NONE  SPECIMEN:  No Specimen  DISPOSITION OF SPECIMEN:  N/A  COUNTS:  YES  TOURNIQUET:  * No tourniquets in log *  DICTATION: .Note written in EPIC  PLAN OF CARE: Admit for overnight observation  PATIENT DISPOSITION:  PACU - hemodynamically stable.   Delay start of Pharmacological VTE agent (>24hrs) due to surgical blood loss or risk of bleeding: not applicable

## 2015-12-31 NOTE — Op Note (Signed)
.  Preoperative diagnosis: Left ureteral stone  Postoperative diagnosis: Same  Procedure: 1 cystoscopy 2. Left retrograde pyelography 3.  Intraoperative fluoroscopy, under one hour, with interpretation 4. Left 6 x 26 JJ stent placement  Attending: Nicolette Bang  Anesthesia: General  Estimated blood loss: None  Drains: Left 6 x 26 JJ ureteral stent without tether, 16 French foley catheter  Specimens: rocephin  Antibiotics: Rocephin  Findings: left proximal ureteral stone. Moderate hydronephrosis. No masses/lesions in the bladder. Ureteral orifices in normal anatomic location.  Indications: Patient is a 55 year old female/female with a history of left ureteral stone and concern for sepsis.  After discussing treatment options, they decided proceed with left stent placement.  Procedure her in detail: The patient was brought to the operating room and a brief timeout was done to ensure correct patient, correct procedure, correct site.  General anesthesia was administered patient was placed in dorsal lithotomy position.  Their genitalia was then prepped and draped in usual sterile fashion.  A rigid 95 French cystoscope was passed in the urethra and the bladder.  Bladder was inspected free masses or lesions.  the ureteral orifices were in the normal orthotopic locations.  a 6 french ureteral catheter was then instilled into the left ureteral orifice.  a gentle retrograde was obtained and findings noted above.  we then placed a zip wire through the ureteral catheter and advanced up to the renal pelvis.    We then placed a 6 x 26 double-j ureteral stent over the original zip wire.  We then removed the wire and good coil was noted in the the renal pelvis under fluoroscopy and the bladder under direct vision.  A foley catheter was then placed. the bladder was then drained and this concluded the procedure which was well tolerated by patient.  Complications: None  Condition: Stable, extubated,  transferred to PACU  Plan: Patient is to be admitted for IV antibiotics. He will have his stone extraction in 2 weeks.

## 2015-12-31 NOTE — ED Notes (Signed)
Bed: [id number] Expected date:  Expected time:  Means of arrival:  Comments: From [facility] placement

## 2015-12-31 NOTE — ED Provider Notes (Signed)
CSN: [id number]     Arrival date & time 12/31/15  1414 History   First MD Initiated Contact with Patient 12/31/15 1506     Chief Complaint  Patient presents with  . Groin Pain     (Consider location/radiation/quality/duration/timing/severity/associated sxs/prior Treatment) HPI   55 year old female with left-sided abdominal/groin pain. Acute onset this morning. Persistent since. No appreciable exacerbating relieving factors. No fevers or chills. Associated nausea. No vomiting. No urinary complaints. No fevers or chills. No unusual vaginal bleeding or discharge.  Past Medical History  Diagnosis Date  . Hyperlipidemia   . Seizure ([place])   . GERD (gastroesophageal reflux disease)   . Kidney stones    Past Surgical History  Procedure Laterality Date  . Appendectomy    . Cervical fusion    . Cholecystectomy    . Tubal ligation      Bilateral  . Oophorectomy      Right   Family History  Problem Relation Age of Onset  . Diabetes Mother   . Heart disease Mother 31    No details about the type of heart disease.    . Colon cancer Neg Hx   . Prostate cancer Other    Social History  Substance Use Topics  . Smoking status: Former Research scientist (life sciences)  . Smokeless tobacco: Never Used  . Alcohol Use: No   OB History    No data available     Review of Systems    Allergies  Terbinafine hcl and Topiramate  Home Medications   Prior to Admission medications   Medication Sig Start Date End Date Taking? Authorizing Provider  b complex vitamins tablet Take 1 tablet by mouth daily.   Yes Historical Provider, MD  DEPAKOTE 500 MG DR tablet Take 2 tablets (1,000 mg total) by mouth 2 (two) times daily. 01/29/15  Yes Wardell Honour, MD  fish oil-omega-3 fatty acids 1000 MG capsule Take 1 capsule by mouth daily.     Yes Historical Provider, MD  pantoprazole (PROTONIX) 20 MG tablet TAKE 1 TABLET DAILY 08/17/15  Yes Wardell Honour, MD  sucralfate (CARAFATE) 1 GM/10ML suspension Take 10 mLs (1 g  total) by mouth 4 (four) times daily -  with meals and at bedtime. 12/24/15  Yes Joshua A Dettinger, MD  TEGRETOL 200 MG tablet TAKE 1 TABLET (200 MG TOTAL) BY MOUTH 4 (FOUR) TIMES DAILY. 12/21/15  Yes Wardell Honour, MD  zonisamide (ZONEGRAN) 100 MG capsule TAKE 1 CAPSULE (100 MG TOTAL) BY MOUTH DAILY. 08/26/15  Yes Chipper Herb, MD   BP 134/84 mmHg  Pulse 89  Temp(Src) 98.2 F (36.8 C) (Oral)  Resp 20  Ht 5\' 5"  (1.651 m)  Wt 152 lb (68.947 kg)  BMI 25.29 kg/m2  SpO2 100% Physical Exam  Constitutional: She appears well-developed and well-nourished. No distress.  HENT:  Head: Normocephalic and atraumatic.  Eyes: Conjunctivae are normal. Right eye exhibits no discharge. Left eye exhibits no discharge.  Neck: Neck supple.  Cardiovascular: Normal rate, regular rhythm and normal heart sounds.  Exam reveals no gallop and no friction rub.   No murmur heard. Pulmonary/Chest: Effort normal and breath sounds normal. No respiratory distress.  Abdominal: Soft. She exhibits no distension. There is no tenderness.  Left-sided abdominal/left flank tenderness without rebound or guarding. No distention.  Genitourinary:  Left CVA tenderness  Musculoskeletal: She exhibits no edema or tenderness.  Neurological: She is alert.  Skin: Skin is warm and dry.  Psychiatric: She has a normal mood  and affect. Her behavior is normal. Thought content normal.  Nursing note and vitals reviewed.   ED Course  Procedures (including critical care time) Labs Review Labs Reviewed  CBC WITH DIFFERENTIAL/PLATELET - Abnormal; Notable for the following:    WBC 12.4 (*)    Neutro Abs 10.6 (*)    All other components within normal limits  BASIC METABOLIC PANEL - Abnormal; Notable for the following:    Glucose, Bld 109 (*)    Calcium 8.5 (*)    All other components within normal limits  URINALYSIS, ROUTINE W REFLEX MICROSCOPIC (NOT AT [facility]) - Abnormal; Notable for the following:    APPearance HAZY (*)    Hgb urine  dipstick MODERATE (*)    Ketones, ur 15 (*)    Protein, ur TRACE (*)    Nitrite POSITIVE (*)    Leukocytes, UA LARGE (*)    All other components within normal limits  URINE MICROSCOPIC-ADD ON - Abnormal; Notable for the following:    Squamous Epithelial / LPF 0-5 (*)    Bacteria, UA MANY (*)    All other components within normal limits  URINE CULTURE    Imaging Review Ct Abdomen Pelvis W Contrast  12/31/2015  CLINICAL DATA:  Left-sided inguinal pain since this morning. EXAM: CT ABDOMEN AND PELVIS WITH CONTRAST TECHNIQUE: Multidetector CT imaging of the abdomen and pelvis was performed using the standard protocol following bolus administration of intravenous contrast. CONTRAST:  150mL OMNIPAQUE IOHEXOL 300 MG/ML  SOLN COMPARISON:  07/20/2009 FINDINGS: 11 mm calculus is present at the left ureteropelvic junction. There is associated hydronephrosis and delayed excretion from the left kidney. Moderate left perinephric stranding. No evidence of right renal calculus or right hydronephrosis. There is enhancement of the wall of the left ureter below the area of obstruction. This indicates inflammation, but not necessarily infection. 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 There is probably also reflux within the right ovarian vein, to a lesser degree. Postcholecystectomy. Liver, spleen, pancreas, and adrenal glands are within normal limits. There is stable appearance. There is no free fluid. There is no abnormal retroperitoneal adenopathy. Bladder is within normal limits. Uterus is within normal limits. Ovaries are unremarkable. No vertebral compression deformity. IMPRESSION: 11 mm left ureteropelvic junction calculus is associated with secondary findings of left ureteral obstruction. 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 Postcholecystectomy. Electronically Signed   By: Marybelle Killings M.D.   On: 12/31/2015 17:51   I have  personally reviewed and evaluated these images and lab results as part of my medical decision-making.   EKG Interpretation None      MDM   Final diagnoses:  Left ureteral stone  Acute UTI    55 year old female with left-sided abdominal pain. CT significant for large proximal left ureteral stone. She received a couple doses of pain medicine without much improvement of symptoms. Urinalysis is consistent with a urinary tract infection. She is afebrile. She was given a dose of Rocephin. Urine culture was sent. Discussed case with Dr. Noah Delaine, urology. Requesting transfer to the emergency room at [facility] long. Discussed with Dr. Eulis Foster, [place]. Pt/father updated on plan.     Virgel Manifold, MD 01/06/16 [phone]

## 2015-12-31 NOTE — ED Notes (Signed)
Pt with left groin pain that started this morning, also c/o burping, LBM for a week and pt took laxative this morning and had BM once arrived here, still c/o nausea

## 2015-12-31 NOTE — Transfer of Care (Signed)
Immediate Anesthesia Transfer of Care Note  Patient: Savannah Shaw  Procedure(s) Performed: Procedure(s): CYSTOSCOPY WITH RETROGRADE PYELOGRAM/URETERAL STENT PLACEMENT (Left)  Patient Location: PACU  Anesthesia Type:General  Level of Consciousness: awake, alert  and oriented  Airway & Oxygen Therapy: Patient Spontanous Breathing and Patient connected to face mask oxygen  Post-op Assessment: Report given to RN and Post -op Vital signs reviewed and stable  Post vital signs: Reviewed and stable  Last Vitals:  Filed Vitals:   12/31/15 1900 12/31/15 1951  BP: 108/65 112/68  Pulse: 95 86  Temp:  36.7 C  Resp:  16    Complications: No apparent anesthesia complications

## 2015-12-31 NOTE — H&P (Signed)
Urology Admission H&P  Chief Complaint: Left flank pain  History of Present Illness: Savannah Shaw is a 55yo with a hx of nephrolithiasis who develop sever sharp, constant nonradiating left flank pain this morning.  She has had over 20 stone events but has never required surgical intervention. The pain was not relieved in the [facility] with multiple doses of morphine and toradol. CT scan show left 45mm UPJ calculus with moderate hydronephrosis and moderate perinephric stranding.  WBC count is 12.4. UA and micro concerning for infection. No fevers.  Past Medical History  Diagnosis Date  . Hyperlipidemia   . Seizure ([place])   . GERD (gastroesophageal reflux disease)   . Kidney stones    Past Surgical History  Procedure Laterality Date  . Appendectomy    . Cervical fusion    . Cholecystectomy    . Tubal ligation      Bilateral  . Oophorectomy      Right    Home Medications:   (Not in a hospital admission) Allergies:  Allergies  Allergen Reactions  . Terbinafine Hcl Rash  . Topiramate Rash    Family History  Problem Relation Age of Onset  . Diabetes Mother   . Heart disease Mother 69    No details about the type of heart disease.    . Colon cancer Neg Hx   . Prostate cancer Other    Social History:  reports that she has quit smoking. She has never used smokeless tobacco. She reports that she does not drink alcohol or use illicit drugs.  Review of Systems  Constitutional: Positive for chills.  Gastrointestinal: Positive for nausea and vomiting.  Genitourinary: Positive for dysuria, urgency and flank pain.  All other systems reviewed and are negative.   Physical Exam:  Vital signs in last 24 hours: Temp:  [98.1 F (36.7 C)-98.2 F (36.8 C)] 98.1 F (36.7 C) (03/16 1951) Pulse Rate:  [86-95] 86 (03/16 1951) Resp:  [16-20] 16 (03/16 1951) BP: (108-134)/(65-84) 112/68 mmHg (03/16 1951) SpO2:  [94 %-100 %] 96 % (03/16 1951) Weight:  [68.947 kg (152 lb)] 68.947 kg (152  lb) (03/16 1425) Physical Exam  Constitutional: She is oriented to person, place, and time. She appears well-developed and well-nourished.  HENT:  Head: Normocephalic and atraumatic.  Eyes: EOM are normal. Pupils are equal, round, and reactive to light.  Neck: Normal range of motion. No thyromegaly present.  Cardiovascular: Normal rate and regular rhythm.   Respiratory: Effort normal. No respiratory distress.  GI: Soft. She exhibits no distension. There is no tenderness.  Musculoskeletal: Normal range of motion.  Neurological: She is alert and oriented to person, place, and time.  Skin: Skin is warm and dry.  Psychiatric: She has a normal mood and affect. Her behavior is normal. Judgment and thought content normal.    Laboratory Data:  Results for orders placed or performed during the hospital encounter of 12/31/15 (from the past 24 hour(s))  CBC with Differential     Status: Abnormal   Collection Time: 12/31/15  3:44 PM  Result Value Ref Range   WBC 12.4 (H) 4.0 - 10.5 K/uL   RBC 3.99 3.87 - 5.11 MIL/uL   Hemoglobin 13.4 12.0 - 15.0 g/dL   HCT 38.8 36.0 - 46.0 %   MCV 97.2 78.0 - 100.0 fL   MCH 33.6 26.0 - 34.0 pg   MCHC 34.5 30.0 - 36.0 g/dL   RDW 12.7 11.5 - 15.5 %   Platelets 265 150 -  400 K/uL   Neutrophils Relative % 85 %   Neutro Abs 10.6 (H) 1.7 - 7.7 K/uL   Lymphocytes Relative 8 %   Lymphs Abs 1.0 0.7 - 4.0 K/uL   Monocytes Relative 7 %   Monocytes Absolute 0.8 0.1 - 1.0 K/uL   Eosinophils Relative 0 %   Eosinophils Absolute 0.0 0.0 - 0.7 K/uL   Basophils Relative 0 %   Basophils Absolute 0.0 0.0 - 0.1 K/uL  Basic metabolic panel     Status: Abnormal   Collection Time: 12/31/15  3:44 PM  Result Value Ref Range   Sodium 138 135 - 145 mmol/Shaw   Potassium 3.8 3.5 - 5.1 mmol/Shaw   Chloride 107 101 - 111 mmol/Shaw   CO2 24 22 - 32 mmol/Shaw   Glucose, Bld 109 (H) 65 - 99 mg/dL   BUN 12 6 - 20 mg/dL   Creatinine, Ser 0.63 0.44 - 1.00 mg/dL   Calcium 8.5 (Shaw) 8.9 - 10.3  mg/dL   GFR calc non Af Amer >60 >60 mL/min   GFR calc Af Amer >60 >60 mL/min   Anion gap 7 5 - 15  Urinalysis, Routine w reflex microscopic (not at [facility])     Status: Abnormal   Collection Time: 12/31/15  4:00 PM  Result Value Ref Range   Color, Urine YELLOW YELLOW   APPearance HAZY (A) CLEAR   Specific Gravity, Urine 1.020 1.005 - 1.030   pH 5.5 5.0 - 8.0   Glucose, UA NEGATIVE NEGATIVE mg/dL   Hgb urine dipstick MODERATE (A) NEGATIVE   Bilirubin Urine NEGATIVE NEGATIVE   Ketones, ur 15 (A) NEGATIVE mg/dL   Protein, ur TRACE (A) NEGATIVE mg/dL   Nitrite POSITIVE (A) NEGATIVE   Leukocytes, UA LARGE (A) NEGATIVE  Urine microscopic-add on     Status: Abnormal   Collection Time: 12/31/15  4:00 PM  Result Value Ref Range   Squamous Epithelial / LPF 0-5 (A) NONE SEEN   WBC, UA TOO NUMEROUS TO COUNT 0 - 5 WBC/hpf   RBC / HPF 6-30 0 - 5 RBC/hpf   Bacteria, UA MANY (A) NONE SEEN   No results found for this or any previous visit (from the past 240 hour(s)). Creatinine:  Recent Labs  12/31/15 1544  CREATININE 0.63   Baseline Creatinine: unknown  Impression/Assessment:  54yo with left ureteral calculus, leukocytosis, concern for sepsis from urinary source  Plan:  The risks/benefits/alternatives to left ureteral stent placement was explained to the patient and she understands and wishes to proceed with surgery  Savannah Shaw 12/31/2015, 8:38 PM

## 2015-12-31 NOTE — Anesthesia Procedure Notes (Signed)
Procedure Name: Intubation Date/Time: 12/31/2015 9:04 PM Performed by: Riki Sheer Pre-anesthesia Checklist: Patient identified, Emergency Drugs available, Suction available, Patient being monitored and Timeout performed Patient Re-evaluated:Patient Re-evaluated prior to inductionPreoxygenation: Pre-oxygenation with 100% oxygen Intubation Type: IV induction, Rapid sequence and Cricoid Pressure applied Laryngoscope Size: Miller and 2 Grade View: Grade I Tube type: Oral Tube size: 7.5 mm Number of attempts: 1 Airway Equipment and Method: Stylet Placement Confirmation: ETT inserted through vocal cords under direct vision,  positive ETCO2,  CO2 detector and breath sounds checked- equal and bilateral Secured at: 21 cm Tube secured with: Tape Dental Injury: Teeth and Oropharynx as per pre-operative assessment

## 2015-12-31 NOTE — Anesthesia Preprocedure Evaluation (Addendum)
Anesthesia Evaluation  Patient identified by MRN, date of birth, ID band Patient awake    Reviewed: Allergy & Precautions, NPO status , Patient's Chart, lab work & pertinent test results  Airway Mallampati: II  TM Distance: >3 FB Neck ROM: Full    Dental no notable dental hx.    Pulmonary neg pulmonary ROS, former smoker,    Pulmonary exam normal breath sounds clear to auscultation       Cardiovascular negative cardio ROS Normal cardiovascular exam Rhythm:Regular Rate:Normal     Neuro/Psych Seizures -,  negative psych ROS   GI/Hepatic Neg liver ROS, GERD  Medicated,  Endo/Other  negative endocrine ROS  Renal/GU negative Renal ROS  negative genitourinary   Musculoskeletal negative musculoskeletal ROS (+)   Abdominal   Peds negative pediatric ROS (+)  Hematology negative hematology ROS (+)   Anesthesia Other Findings   Reproductive/Obstetrics negative OB ROS                            Anesthesia Physical Anesthesia Plan  ASA: II  Anesthesia Plan: General   Post-op Pain Management:    Induction: Intravenous  Airway Management Planned: Oral ETT  Additional Equipment:   Intra-op Plan:   Post-operative Plan: Extubation in OR  Informed Consent: I have reviewed the patients History and Physical, chart, labs and discussed the procedure including the risks, benefits and alternatives for the proposed anesthesia with the patient or authorized representative who has indicated his/her understanding and acceptance.   Dental advisory given  Plan Discussed with: CRNA and Surgeon  Anesthesia Plan Comments: (Contrast at 1800)       Anesthesia Quick Evaluation

## 2016-01-01 ENCOUNTER — Encounter (HOSPITAL_COMMUNITY): Payer: Self-pay | Admitting: Urology

## 2016-01-01 LAB — CBC
HCT: 32.3 % — ABNORMAL LOW (ref 36.0–46.0)
HEMOGLOBIN: 11.1 g/dL — AB (ref 12.0–15.0)
MCH: 32.7 pg (ref 26.0–34.0)
MCHC: 34.4 g/dL (ref 30.0–36.0)
MCV: 95.3 fL (ref 78.0–100.0)
PLATELETS: 234 10*3/uL (ref 150–400)
RBC: 3.39 MIL/uL — AB (ref 3.87–5.11)
RDW: 13 % (ref 11.5–15.5)
WBC: 8.9 10*3/uL (ref 4.0–10.5)

## 2016-01-01 LAB — BASIC METABOLIC PANEL
ANION GAP: 8 (ref 5–15)
BUN: 7 mg/dL (ref 6–20)
CHLORIDE: 112 mmol/L — AB (ref 101–111)
CO2: 22 mmol/L (ref 22–32)
Calcium: 8.1 mg/dL — ABNORMAL LOW (ref 8.9–10.3)
Creatinine, Ser: 0.36 mg/dL — ABNORMAL LOW (ref 0.44–1.00)
GFR calc Af Amer: >60 mL/min (ref >60)
Glucose, Bld: 131 mg/dL — ABNORMAL HIGH (ref 65–99)
POTASSIUM: 3.9 mmol/L (ref 3.5–5.1)
SODIUM: 142 mmol/L (ref 135–145)

## 2016-01-01 MED ORDER — DEXTROSE 5 % IV SOLN
2.0000 g | INTRAVENOUS | Status: DC
  Administered 2016-01-01: 2 g via INTRAVENOUS
  Filled 2016-01-01: qty 2

## 2016-01-01 MED ORDER — MENTHOL 3 MG MT LOZG
1.0000 | LOZENGE | OROMUCOSAL | Status: DC | PRN
  Administered 2016-01-01: 3 mg via ORAL
  Filled 2016-01-01: qty 9

## 2016-01-01 NOTE — Progress Notes (Signed)
Dr Alyson Ingles was made aware of pt's temp. No new orders

## 2016-01-01 NOTE — Progress Notes (Signed)
Spoke with staff of Dr Noland Fordyce office to notified of pt's temp 100.7.  Nurse stated she will page MD to call my phone. Will wait for call to be retuned

## 2016-01-01 NOTE — Progress Notes (Signed)
Pharmacy Antibiotic Note  Savannah Shaw is a 55 y.o. female admitted on 12/31/2015 with UTI.  Pharmacy has been consulted for Ceftriaxone dosing.  Plan: Ceftriaxone 1gm iv q24hr  Height: 5\' 5"  (165.1 cm) Weight: 152 lb (68.947 kg) IBW/kg (Calculated) : 57  Temp (24hrs), Avg:98.4 F (36.9 C), Min:98.1 F (36.7 C), Max:98.8 F (37.1 C)   Recent Labs Lab 12/31/15 1544 01/01/16 0431  WBC 12.4* 8.9  CREATININE 0.63 0.36*    Estimated Creatinine Clearance: 78.4 mL/min (by C-G formula based on Cr of 0.36).    Allergies  Allergen Reactions  . Terbinafine Hcl Rash  . Topiramate Rash    Antimicrobials this admission: Ceftriaxone 3/16 >>    Thank you for allowing pharmacy to be a part of this patient's care.  Nani Skillern Crowford 01/01/2016 7:12 AM

## 2016-01-02 MED ORDER — PHENAZOPYRIDINE HCL 200 MG PO TABS: 200.0000 mg | ORAL_TABLET | Freq: Three times a day (TID) | ORAL | Status: DC | PRN

## 2016-01-02 MED ORDER — TRAMADOL-ACETAMINOPHEN 37.5-325 MG PO TABS: 1.0000 | ORAL_TABLET | Freq: Four times a day (QID) | ORAL | Status: DC | PRN

## 2016-01-02 MED ORDER — CIPROFLOXACIN HCL 500 MG PO TABS: 500.0000 mg | ORAL_TABLET | Freq: Two times a day (BID) | ORAL | Status: DC

## 2016-01-02 NOTE — Progress Notes (Signed)
Urology Progress Note  2 Days Post-Op   Subjective: Surgery: 12/31/15 for Left 6 fx 26cm JJ stent placement for Left UPJ stone , 56mm. Admission wbc 12, 400.    No acute urologic events overnight. Ambulation:   negative Flatus:    positive Bowel movement  negative  Pain: Excellent.  Objective:  Blood pressure 107/61, pulse 74, temperature 99.2 F (37.3 C), temperature source Oral, resp. rate 15, height 5\' 5"  (1.651 m), weight 68.947 kg (152 lb), SpO2 97 %.  Physical Exam:  General:  No acute distress, awake Genitoitourinary:   No pain Foley: none    I/O last 3 completed shifts: In: 1899.2 [P.O.:120; I.V.:1779.2] Out: 3301 [Urine:3300; Stool:1]  Recent Labs     12/31/15  1544  01/01/16  0431  HGB  13.4  11.1*  WBC  12.4*  8.9  PLT  265  234    Recent Labs     12/31/15  1544  01/01/16  0431  NA  138  142  K  3.8  3.9  CL  107  112*  CO2  24  22  BUN  12  7  CREATININE  0.63  0.36*  CALCIUM  8.5*  8.1*  GFRNONAA  >60  >60  GFRAA  >60  >60     No results for input(s): INR, APTT in the last 72 hours.  Invalid input(s): PT   Invalid input(s): ABG  Assessment/Plan:  Continue any current medications.  Pt to call to find out when her surgery is scheduled.  Dc today.

## 2016-01-02 NOTE — Progress Notes (Signed)
Subjective: Patient reports mild left flank pain. She had a fever of 100.7. Tolerating diet  Objective: Vital signs in last 24 hours: Temp:  [99.1 F (37.3 C)-100.7 F (38.2 C)] 99.2 F (37.3 C) (03/18 0637) Pulse Rate:  [74-95] 74 (03/18 0637) Resp:  [15-16] 15 (03/18 0637) BP: (91-107)/(47-61) 107/61 mmHg (03/18 0637) SpO2:  [96 %-100 %] 97 % (03/18 0637)  Intake/Output from previous day: 03/17 0701 - 03/18 0700 In: -  Out: 1900 [Urine:1900] Intake/Output this shift:    Physical Exam:  General:alert, cooperative and appears stated age GI: soft, non tender, normal bowel sounds, no palpable masses, no organomegaly, no inguinal hernia Female genitalia: not done Extremities: extremities normal, atraumatic, no cyanosis or edema  Lab Results:  Recent Labs  12/31/15 1544 01/01/16 0431  HGB 13.4 11.1*  HCT 38.8 32.3*   BMET  Recent Labs  12/31/15 1544 01/01/16 0431  NA 138 142  K 3.8 3.9  CL 107 112*  CO2 24 22  GLUCOSE 109* 131*  BUN 12 7  CREATININE 0.63 0.36*  CALCIUM 8.5* 8.1*   No results for input(s): LABPT, INR in the last 72 hours. No results for input(s): LABURIN in the last 72 hours. Results for orders placed or performed during the hospital encounter of 12/31/15  Urine culture     Status: None (Preliminary result)   Collection Time: 12/31/15  4:15 PM  Result Value Ref Range Status   Specimen Description URINE, CATHETERIZED  Final   Special Requests NONE  Final   Culture   Final    CULTURE REINCUBATED FOR BETTER GROWTH Performed at [facility]    Report Status PENDING  Incomplete    Studies/Results: Ct Abdomen Pelvis W Contrast  12/31/2015  CLINICAL DATA:  Left-sided inguinal pain since this morning. EXAM: CT ABDOMEN AND PELVIS WITH CONTRAST TECHNIQUE: Multidetector CT imaging of the abdomen and pelvis was performed using the standard protocol following bolus administration of intravenous contrast. CONTRAST:  17mL OMNIPAQUE IOHEXOL 300  MG/ML  SOLN COMPARISON:  07/20/2009 FINDINGS: 11 mm calculus is present at the left ureteropelvic junction. There is associated hydronephrosis and delayed excretion from the left kidney. Moderate left perinephric stranding. No evidence of right renal calculus or right hydronephrosis. There is enhancement of the wall of the left ureter below the area of obstruction. This indicates inflammation, but not necessarily infection. 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 There is probably also reflux within the right ovarian vein, to a lesser degree. Postcholecystectomy. Liver, spleen, pancreas, and adrenal glands are within normal limits. There is stable appearance. There is no free fluid. There is no abnormal retroperitoneal adenopathy. Bladder is within normal limits. Uterus is within normal limits. Ovaries are unremarkable. No vertebral compression deformity. IMPRESSION: 11 mm left ureteropelvic junction calculus is associated with secondary findings of left ureteral obstruction. 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 Postcholecystectomy. Electronically Signed   By: Marybelle Killings M.D.   On: 12/31/2015 17:51    Assessment/Plan: POD#1 from left stent placement for sspsis from a urinary source and left obstructing ureteral calculus  Plan: 1. Continue rocephin 2. D/c foley catheter 3. Likely discharge tomorrow pending cultures     Amit Meloy L 01/02/2016, 8:29 AM

## 2016-01-03 LAB — URINE CULTURE: Culture: >=100000

## 2016-01-04 ENCOUNTER — Other Ambulatory Visit: Payer: Self-pay | Admitting: Urology

## 2016-01-07 NOTE — Discharge Summary (Signed)
Physician Discharge Summary  Patient ID: Savannah Shaw MRN: [id number] DOB/AGE: [id number] y.o.  Admit date: 12/31/2015 Discharge date:01/02/2016 Admission Diagnoses: Ureteral calculus, sepsis Discharge Diagnoses:  Active Problems:   Ureteral calculi   Discharged Condition: good  Hospital Course: The patient tolerated the procedure well and was transferred to the floor on IV pain meds, IV fluid. On POD#1 foley was removed, pt was started on clear liquid diet and they ambulated in the halls. On POD#2 the patient was transitioned to a regular diet, IVFs were discontinued, and the patient passed flatus. Prior to discharge the pt was tolerating a regular diet, pain was controlled on PO pain meds, they were ambulating without difficulty, and they had normal bowel function.   Consults: None  Significant Diagnostic Studies: labs: urine culture  Treatments: surgery: Ureteral stent placement  Discharge Exam: Blood pressure 107/61, pulse 74, temperature 99.2 F (37.3 C), temperature source Oral, resp. rate 15, height 5\' 5"  (1.651 m), weight 68.947 kg (152 lb), SpO2 97 %. General appearance: alert, cooperative and appears stated age Head: Normocephalic, without obvious abnormality, atraumatic Eyes: conjunctivae/corneas clear. PERRL, EOM's intact. Fundi benign. Nose: Nares normal. Septum midline. Mucosa normal. No drainage or sinus tenderness. Cardio: regular rate and rhythm, S1, S2 normal, no murmur, click, rub or gallop GI: soft, non-tender; bowel sounds normal; no masses,  no organomegaly Neurologic: Grossly normal  Disposition: 01-Home or Self Care     Medication List    STOP taking these medications        b complex vitamins tablet     DEPAKOTE 500 MG DR tablet  Generic drug:  divalproex      TAKE these medications        ciprofloxacin 500 MG tablet  Commonly known as:  CIPRO  Take 1 tablet (500 mg total) by mouth 2 (two) times daily.     fish oil-omega-3 fatty acids  1000 MG capsule  Take 1 capsule by mouth daily.     pantoprazole 20 MG tablet  Commonly known as:  PROTONIX  TAKE 1 TABLET DAILY     phenazopyridine 200 MG tablet  Commonly known as:  PYRIDIUM  Take 1 tablet (200 mg total) by mouth 3 (three) times daily as needed for pain.     sucralfate 1 GM/10ML suspension  Commonly known as:  CARAFATE  Take 10 mLs (1 g total) by mouth 4 (four) times daily -  with meals and at bedtime.     TEGRETOL 200 MG tablet  Generic drug:  carbamazepine  TAKE 1 TABLET (200 MG TOTAL) BY MOUTH 4 (FOUR) TIMES DAILY.     traMADol-acetaminophen 37.5-325 MG tablet  Commonly known as:  ULTRACET  Take 1 tablet by mouth every 6 (six) hours as needed for moderate pain.     zonisamide 100 MG capsule  Commonly known as:  ZONEGRAN  TAKE 1 CAPSULE (100 MG TOTAL) BY MOUTH DAILY.         SignedCleon Gustin 01/07/2016, 8:53 AM

## 2016-01-11 ENCOUNTER — Encounter (HOSPITAL_BASED_OUTPATIENT_CLINIC_OR_DEPARTMENT_OTHER): Payer: Self-pay | Admitting: *Deleted

## 2016-01-11 NOTE — Anesthesia Postprocedure Evaluation (Signed)
Anesthesia Post Note  Patient: Savannah Shaw  Procedure(s) Performed: Procedure(s) (LRB): CYSTOSCOPY WITH RETROGRADE PYELOGRAM/URETERAL STENT PLACEMENT (Left)  Patient location during evaluation: PACU Anesthesia Type: General Level of consciousness: awake and alert Pain management: pain level controlled Vital Signs Assessment: post-procedure vital signs reviewed and stable Respiratory status: spontaneous breathing, nonlabored ventilation, respiratory function stable and patient connected to nasal cannula oxygen Cardiovascular status: blood pressure returned to baseline and stable Postop Assessment: no signs of nausea or vomiting Anesthetic complications: no    Last Vitals:  Filed Vitals:   01/01/16 2238 01/02/16 0637  BP: 106/56 107/61  Pulse: 95 74  Temp: 37.3 C 37.3 C  Resp: 16 15    Last Pain:  Filed Vitals:   01/02/16 0638  PainSc: 3                  Pang Robers S

## 2016-01-11 NOTE — Progress Notes (Signed)
NPO AFTER MN WITH EXCEPTION CLEAR LIQUIDS UNTIL 0900 (NO CREAM/ MILK PRODUCTS).  ARRIVE AT 1330.  CURRENT LAB RESULTS IN CHART AND EPIC.  WILL TAKE AM MEDS W/ SIPS OF WATER AND IF NEEDED TAKE TRAMADOL.

## 2016-01-11 NOTE — Anesthesia Preprocedure Evaluation (Addendum)
Anesthesia Evaluation  Patient identified by MRN, date of birth, ID band Patient awake    Reviewed: Allergy & Precautions, H&P , NPO status , Patient's Chart, lab work & pertinent test results  Airway Mallampati: II  TM Distance: >3 FB Neck ROM: Full    Dental no notable dental hx. (+) Dental Advisory Given, Teeth Intact   Pulmonary shortness of breath and with exertion, former smoker,    Pulmonary exam normal breath sounds clear to auscultation       Cardiovascular Exercise Tolerance: Good negative cardio ROS Normal cardiovascular exam Rhythm:Regular Rate:Normal     Neuro/Psych Seizures -, Well Controlled,  Last seizure 2008 negative psych ROS   GI/Hepatic negative GI ROS, Neg liver ROS, GERD  Medicated and Controlled,  Endo/Other  negative endocrine ROS  Renal/GU negative Renal ROS  negative genitourinary   Musculoskeletal negative musculoskeletal ROS (+)   Abdominal   Peds negative pediatric ROS (+)  Hematology negative hematology ROS (+)   Anesthesia Other Findings   Reproductive/Obstetrics negative OB ROS                            Anesthesia Physical Anesthesia Plan  ASA: II  Anesthesia Plan: General   Post-op Pain Management:    Induction: Intravenous  Airway Management Planned: LMA  Additional Equipment:   Intra-op Plan:   Post-operative Plan:   Informed Consent: I have reviewed the patients History and Physical, chart, labs and discussed the procedure including the risks, benefits and alternatives for the proposed anesthesia with the patient or authorized representative who has indicated his/her understanding and acceptance.   Dental advisory given  Plan Discussed with: CRNA and Surgeon  Anesthesia Plan Comments:        Anesthesia Quick Evaluation

## 2016-01-14 ENCOUNTER — Ambulatory Visit (HOSPITAL_BASED_OUTPATIENT_CLINIC_OR_DEPARTMENT_OTHER): Payer: Medicaid Other | Admitting: Anesthesiology

## 2016-01-14 ENCOUNTER — Encounter (HOSPITAL_BASED_OUTPATIENT_CLINIC_OR_DEPARTMENT_OTHER): Payer: Self-pay | Admitting: *Deleted

## 2016-01-14 ENCOUNTER — Encounter (HOSPITAL_BASED_OUTPATIENT_CLINIC_OR_DEPARTMENT_OTHER)
Admission: RE | Disposition: A | Discharge status: Home / Self Care | Payer: Self-pay | Source: Ambulatory Visit | Attending: Urology

## 2016-01-14 ENCOUNTER — Ambulatory Visit (HOSPITAL_BASED_OUTPATIENT_CLINIC_OR_DEPARTMENT_OTHER)
Admission: RE | Admit: 2016-01-14 | Discharge: 2016-01-14 | Disposition: A | Discharge status: Home / Self Care | Payer: Medicaid Other | Source: Ambulatory Visit | Attending: Urology | Admitting: Urology

## 2016-01-14 DIAGNOSIS — Z87891 Personal history of nicotine dependence: Secondary | ICD-10-CM | POA: Insufficient documentation

## 2016-01-14 DIAGNOSIS — D72829 Elevated white blood cell count, unspecified: Secondary | ICD-10-CM | POA: Diagnosis not present

## 2016-01-14 DIAGNOSIS — Z87442 Personal history of urinary calculi: Secondary | ICD-10-CM | POA: Diagnosis not present

## 2016-01-14 DIAGNOSIS — K219 Gastro-esophageal reflux disease without esophagitis: Secondary | ICD-10-CM | POA: Insufficient documentation

## 2016-01-14 DIAGNOSIS — E785 Hyperlipidemia, unspecified: Secondary | ICD-10-CM | POA: Insufficient documentation

## 2016-01-14 DIAGNOSIS — N200 Calculus of kidney: Secondary | ICD-10-CM | POA: Insufficient documentation

## 2016-01-14 DIAGNOSIS — Z9049 Acquired absence of other specified parts of digestive tract: Secondary | ICD-10-CM | POA: Insufficient documentation

## 2016-01-14 HISTORY — PX: HOLMIUM LASER APPLICATION: SHX5852

## 2016-01-14 HISTORY — PX: CYSTOSCOPY W/ URETERAL STENT PLACEMENT: SHX1429

## 2016-01-14 HISTORY — DX: Personal history of urinary (tract) infections: Z87.440

## 2016-01-14 HISTORY — DX: Reserved for inherently not codable concepts without codable children: IMO0001

## 2016-01-14 HISTORY — DX: Personal history of urinary calculi: Z87.442

## 2016-01-14 HISTORY — DX: Personal history of other diseases of urinary system: Z87.448

## 2016-01-14 HISTORY — DX: Other generalized epilepsy and epileptic syndromes, not intractable, without status epilepticus: G40.409

## 2016-01-14 HISTORY — DX: Calculus of ureter: N20.1

## 2016-01-14 HISTORY — PX: CYSTOSCOPY/RETROGRADE/URETEROSCOPY/STONE EXTRACTION WITH BASKET: SHX5317

## 2016-01-14 SURGERY — CYSTOSCOPY, WITH CALCULUS REMOVAL USING BASKET
Anesthesia: General | Site: Ureter | Laterality: Left

## 2016-01-14 MED ORDER — DEXTROSE 5 % IV SOLN
INTRAVENOUS | Status: AC
Start: 2016-01-14 — End: 2016-01-14
  Filled 2016-01-14: qty 50

## 2016-01-14 MED ORDER — DEXTROSE 5 % IV SOLN
2.0000 g | INTRAVENOUS | Status: AC
  Administered 2016-01-14: 2 g via INTRAVENOUS
  Filled 2016-01-14: qty 2

## 2016-01-14 MED ORDER — IOPAMIDOL (ISOVUE-370) INJECTION 76%
INTRAVENOUS | Status: DC | PRN
  Administered 2016-01-14: 7 mL
  Administered 2016-01-14: 5 mL

## 2016-01-14 MED ORDER — METOCLOPRAMIDE HCL 5 MG/ML IJ SOLN
INTRAMUSCULAR | Status: AC
  Filled 2016-01-14: qty 2

## 2016-01-14 MED ORDER — OXYCODONE-ACETAMINOPHEN 5-325 MG PO TABS: 1.0000 | ORAL_TABLET | ORAL | Status: DC | PRN

## 2016-01-14 MED ORDER — LIDOCAINE HCL (CARDIAC) 20 MG/ML IV SOLN
INTRAVENOUS | Status: AC
  Filled 2016-01-14: qty 5

## 2016-01-14 MED ORDER — MIDAZOLAM HCL 2 MG/2ML IJ SOLN
0.5000 mg | Freq: Once | INTRAMUSCULAR | Status: DC | PRN
  Filled 2016-01-14: qty 2

## 2016-01-14 MED ORDER — LIDOCAINE HCL (CARDIAC) 20 MG/ML IV SOLN
INTRAVENOUS | Status: DC | PRN
  Administered 2016-01-14: 60 mg via INTRAVENOUS

## 2016-01-14 MED ORDER — FENTANYL CITRATE (PF) 100 MCG/2ML IJ SOLN
INTRAMUSCULAR | Status: DC | PRN
  Administered 2016-01-14 (×2): 50 ug via INTRAVENOUS

## 2016-01-14 MED ORDER — MIDAZOLAM HCL 2 MG/2ML IJ SOLN
INTRAMUSCULAR | Status: DC | PRN
  Administered 2016-01-14 (×2): 1 mg via INTRAVENOUS

## 2016-01-14 MED ORDER — FENTANYL CITRATE (PF) 100 MCG/2ML IJ SOLN
INTRAMUSCULAR | Status: AC
  Filled 2016-01-14: qty 2

## 2016-01-14 MED ORDER — PROMETHAZINE HCL 25 MG/ML IJ SOLN
6.2500 mg | INTRAMUSCULAR | Status: DC | PRN
  Filled 2016-01-14: qty 1

## 2016-01-14 MED ORDER — MIDAZOLAM HCL 2 MG/2ML IJ SOLN
INTRAMUSCULAR | Status: AC
  Filled 2016-01-14: qty 2

## 2016-01-14 MED ORDER — SODIUM CHLORIDE 0.9 % IR SOLN
Status: DC | PRN
  Administered 2016-01-14: 2000 mL

## 2016-01-14 MED ORDER — CEFTRIAXONE SODIUM 2 G IJ SOLR
INTRAMUSCULAR | Status: AC
  Filled 2016-01-14: qty 2

## 2016-01-14 MED ORDER — ONDANSETRON HCL 4 MG/2ML IJ SOLN
INTRAMUSCULAR | Status: AC
  Filled 2016-01-14: qty 2

## 2016-01-14 MED ORDER — ONDANSETRON HCL 4 MG/2ML IJ SOLN
INTRAMUSCULAR | Status: DC | PRN
  Administered 2016-01-14: 4 mg via INTRAVENOUS

## 2016-01-14 MED ORDER — FENTANYL CITRATE (PF) 100 MCG/2ML IJ SOLN
25.0000 ug | INTRAMUSCULAR | Status: DC | PRN
  Filled 2016-01-14: qty 1

## 2016-01-14 MED ORDER — MEPERIDINE HCL 25 MG/ML IJ SOLN
6.2500 mg | INTRAMUSCULAR | Status: DC | PRN
  Filled 2016-01-14: qty 1

## 2016-01-14 MED ORDER — LACTATED RINGERS IV SOLN
INTRAVENOUS | Status: DC
  Administered 2016-01-14: 14:00:00 via INTRAVENOUS
  Filled 2016-01-14: qty 1000

## 2016-01-14 MED ORDER — CEFTRIAXONE SODIUM 1 G IJ SOLR
1.0000 g | INTRAMUSCULAR | Status: DC
  Filled 2016-01-14: qty 10

## 2016-01-14 MED ORDER — DEXAMETHASONE SODIUM PHOSPHATE 4 MG/ML IJ SOLN
INTRAMUSCULAR | Status: DC | PRN
  Administered 2016-01-14: 10 mg via INTRAVENOUS

## 2016-01-14 MED ORDER — ONDANSETRON 4 MG PO TBDP
4.0000 mg | ORAL_TABLET | Freq: Three times a day (TID) | ORAL | Status: DC | PRN
Start: 2016-01-14 — End: 2016-04-26

## 2016-01-14 MED ORDER — TRAMADOL-ACETAMINOPHEN 37.5-325 MG PO TABS: 1.0000 | ORAL_TABLET | Freq: Four times a day (QID) | ORAL | Status: DC | PRN

## 2016-01-14 MED ORDER — KETOROLAC TROMETHAMINE 30 MG/ML IJ SOLN
INTRAMUSCULAR | Status: DC | PRN
  Administered 2016-01-14: 30 mg via INTRAVENOUS

## 2016-01-14 MED ORDER — METOCLOPRAMIDE HCL 5 MG/ML IJ SOLN
INTRAMUSCULAR | Status: DC | PRN
  Administered 2016-01-14: 5 mg via INTRAVENOUS

## 2016-01-14 MED ORDER — PROPOFOL 10 MG/ML IV BOLUS
INTRAVENOUS | Status: DC | PRN
  Administered 2016-01-14: 150 mg via INTRAVENOUS

## 2016-01-14 SURGICAL SUPPLY — 28 items
BAG DRAIN URO-CYSTO SKYTR STRL (DRAIN) ×3 IMPLANT
BAG DRN UROCATH (DRAIN) ×1
BASKET DAKOTA 1.9FR 11X120 (BASKET) IMPLANT
BASKET STONE 1.7 NGAGE (UROLOGICAL SUPPLIES) ×2 IMPLANT
BASKET ZERO TIP NITINOL 2.4FR (BASKET) IMPLANT
BSKT STON RTRVL ZERO TP 2.4FR (BASKET)
CATH INTERMIT  6FR 70CM (CATHETERS) ×2 IMPLANT
CLOTH BEACON ORANGE TIMEOUT ST (SAFETY) ×3 IMPLANT
FIBER LASER TRAC TIP (UROLOGICAL SUPPLIES) ×2 IMPLANT
GLOVE BIO SURGEON STRL SZ8 (GLOVE) ×3 IMPLANT
GLOVE SURG SS PI 7.5 STRL IVOR (GLOVE) ×2 IMPLANT
GOWN STRL REUS W/ TWL LRG LVL3 (GOWN DISPOSABLE) ×1 IMPLANT
GOWN STRL REUS W/ TWL XL LVL3 (GOWN DISPOSABLE) ×1 IMPLANT
GOWN STRL REUS W/TWL LRG LVL3 (GOWN DISPOSABLE) ×3
GOWN STRL REUS W/TWL XL LVL3 (GOWN DISPOSABLE) ×3
GUIDEWIRE ANG ZIPWIRE 038X150 (WIRE) ×3 IMPLANT
GUIDEWIRE STR DUAL SENSOR (WIRE) IMPLANT
IV NS 1000ML (IV SOLUTION) ×6
IV NS 1000ML BAXH (IV SOLUTION) IMPLANT
IV NS IRRIG 3000ML ARTHROMATIC (IV SOLUTION) ×1 IMPLANT
KIT ROOM TURNOVER WOR (KITS) ×3 IMPLANT
MANIFOLD NEPTUNE II (INSTRUMENTS) ×2 IMPLANT
PACK CYSTO (CUSTOM PROCEDURE TRAY) ×3 IMPLANT
STENT URET 6FRX26 CONTOUR (STENTS) ×2 IMPLANT
SYRINGE 10CC LL (SYRINGE) ×3 IMPLANT
TUBE CONNECTING 12'X1/4 (SUCTIONS) ×1
TUBE CONNECTING 12X1/4 (SUCTIONS) ×1 IMPLANT
TUBE FEEDING 8FR 16IN STR KANG (MISCELLANEOUS) IMPLANT

## 2016-01-14 NOTE — Anesthesia Postprocedure Evaluation (Signed)
Anesthesia Post Note  Patient: [name]  Procedure(s) Performed: Procedure(s) (LRB): CYSTOSCOPY/RETROGRADE/URETEROSCOPY/STONE EXTRACTION WITH BASKET (Left) HOLMIUM LASER APPLICATION (Left) CYSTOSCOPY WITH STENT REPLACEMENT (Left)  Patient location during evaluation: PACU Anesthesia Type: General Level of consciousness: awake and alert, oriented and patient cooperative Pain management: pain level controlled Vital Signs Assessment: post-procedure vital signs reviewed and stable Respiratory status: spontaneous breathing, nonlabored ventilation and respiratory function stable Cardiovascular status: blood pressure returned to baseline and stable Postop Assessment: no signs of nausea or vomiting Anesthetic complications: no    Last Vitals:  Filed Vitals:   01/14/16 1745 01/14/16 1800  BP: 129/60 134/59  Pulse: 59 64  Temp:  36.7 C  Resp: 12 16    Last Pain:  Filed Vitals:   01/14/16 1818  PainSc: 0-No pain                 Kennis Buell,E. Jahniya Duzan

## 2016-01-14 NOTE — Brief Op Note (Signed)
01/14/2016  4:55 PM  PATIENT:  Savannah Shaw  55 y.o. female  PRE-OPERATIVE DIAGNOSIS:  LEFT URETERAL STONE  POST-OPERATIVE DIAGNOSIS:  LEFT URETERAL STONE  PROCEDURE:  Procedure(s): CYSTOSCOPY/RETROGRADE/URETEROSCOPY/STONE EXTRACTION WITH BASKET (Left) HOLMIUM LASER APPLICATION (Left) CYSTOSCOPY WITH STENT REPLACEMENT (Left)  SURGEON:  Surgeon(s) and Role:    * Cleon Gustin, MD - Primary  PHYSICIAN ASSISTANT:   ASSISTANTS: none   ANESTHESIA:   general  EBL:  Total I/O In: 500 [I.V.:500] Out: 0   BLOOD ADMINISTERED:none  DRAINS: left 6 x 266 JJ ureteral stent without tether  LOCAL MEDICATIONS USED:  NONE  SPECIMEN:  Source of Specimen:  left renal calculus  DISPOSITION OF SPECIMEN:  N/A  COUNTS:  YES  TOURNIQUET:  * No tourniquets in log *  DICTATION: .Note written in EPIC  PLAN OF CARE: Discharge to home after PACU  PATIENT DISPOSITION:  PACU - hemodynamically stable.   Delay start of Pharmacological VTE agent (>24hrs) due to surgical blood loss or risk of bleeding: not applicable

## 2016-01-14 NOTE — Discharge Instructions (Signed)
Ureteroscopy, Care After Refer to this sheet in the next few weeks. These instructions provide you with information on caring for yourself after your procedure. Your health care provider may also give you more specific instructions. Your treatment has been planned according to current medical practices, but problems sometimes occur. Call your health care provider if you have any problems or questions after your procedure.  WHAT TO EXPECT AFTER THE PROCEDURE  After your procedure, it is typical to have the following:   A burning sensation when you urinate.  Blood in your urine. HOME CARE INSTRUCTIONS   Only take medicines as directed by your health care provider. Do not take any over-the-counter pain medication unless your health care provider says it is okay.  Take a warm bath or hold a warm washcloth over your groin to relieve burning.  Drink enough fluids to keep your urine clear or pale yellow.  Drink two 8-ounce glasses of water every hour for the first 2 hours after you get home.  Continue to drink water often at home.  You can eat what you usually do.  Ask your surgeon when you can do your usual activities.  If you had a tube placed to keep urine flowing (ureteral stent), ask your health care provider when you need to return to have it removed.  Keep all follow-up appointments. SEEK MEDICAL CARE IF:   You have chills or fever.  You have burning pain for longer than 24 hours after the procedure.  You have blood in your urine for longer than 24 hours after the procedure. SEEK IMMEDIATE MEDICAL CARE IF:   You have large amounts of blood or clots in your urine.  You have very bad pain.  You have chest pain or trouble breathing.   This information is not intended to replace advice given to you by your health care provider. Make sure you discuss any questions you have with your health care provider.   Document Released: 10/08/2013 Document Reviewed: 10/08/2013 Elsevier  Interactive Patient Education 2016 [facility] Home Care Instructions  Activity: Get plenty of rest for the remainder of the day. A responsible adult should stay with you for 24 hours following the procedure.  For the next 24 hours, DO NOT: -Drive a car -Paediatric nurse -Drink alcoholic beverages -Take any medication unless instructed by your physician -Make any legal decisions or sign important papers.  Meals: Start with liquid foods such as gelatin or soup. Progress to regular foods as tolerated. Avoid greasy, spicy, heavy foods. If nausea and/or vomiting occur, drink only clear liquids until the nausea and/or vomiting subsides. Call your physician if vomiting continues.  Special Instructions/Symptoms: Your throat may feel dry or sore from the anesthesia or the breathing tube placed in your throat during surgery. If this causes discomfort, gargle with warm salt water. The discomfort should disappear within 24 hours.  If you had a scopolamine patch placed behind your ear for the management of post- operative nausea and/or vomiting:  1. The medication in the patch is effective for 72 hours, after which it should be removed.  Wrap patch in a tissue and discard in the trash. Wash hands thoroughly with soap and water. 2. You may remove the patch earlier than 72 hours if you experience unpleasant side effects which may include dry mouth, dizziness or visual disturbances. 3. Avoid touching the patch. Wash your hands with soap and water after contact with the patch.

## 2016-01-14 NOTE — Interval H&P Note (Signed)
History and Physical Interval Note:  01/14/2016 3:30 PM  Savannah Shaw  has presented today for surgery, with the diagnosis of LEFT URETERAL STONE  The various methods of treatment have been discussed with the patient and family. After consideration of risks, benefits and other options for treatment, the patient has consented to  Procedure(s): CYSTOSCOPY/RETROGRADE/URETEROSCOPY/STONE EXTRACTION WITH BASKET (Left) HOLMIUM LASER APPLICATION (Left) CYSTOSCOPY WITH STENT REPLACEMENT (Left) as a surgical intervention .  The patient's history has been reviewed, patient examined, no change in status, stable for surgery.  I have reviewed the patient's chart and labs.  Questions were answered to the patient's satisfaction.     Chanel Mckesson L

## 2016-01-14 NOTE — Anesthesia Procedure Notes (Signed)
Procedure Name: LMA Insertion Date/Time: 01/14/2016 4:15 PM Performed by: Mechele Claude Pre-anesthesia Checklist: Patient identified, Emergency Drugs available, Suction available and Patient being monitored Patient Re-evaluated:Patient Re-evaluated prior to inductionOxygen Delivery Method: Circle System Utilized Preoxygenation: Pre-oxygenation with 100% oxygen Intubation Type: IV induction Ventilation: Mask ventilation without difficulty LMA: LMA inserted LMA Size: 4.0 Number of attempts: 1 Airway Equipment and Method: bite block Placement Confirmation: positive ETCO2 Tube secured with: Tape Dental Injury: Teeth and Oropharynx as per pre-operative assessment

## 2016-01-14 NOTE — Transfer of Care (Signed)
  Last Vitals:  Filed Vitals:   01/14/16 1331  BP: 115/69  Pulse: 65  Temp: 36.6 C  Resp: 16    Immediate Anesthesia Transfer of Care Note  Patient: Savannah Shaw  Procedure(s) Performed: Procedure(s) (LRB): CYSTOSCOPY/RETROGRADE/URETEROSCOPY/STONE EXTRACTION WITH BASKET (Left) HOLMIUM LASER APPLICATION (Left) CYSTOSCOPY WITH STENT REPLACEMENT (Left)  Patient Location: PACU  Anesthesia Type: General  Level of Consciousness: awake, alert  and oriented  Airway & Oxygen Therapy: Patient Spontanous Breathing and Patient connected to nasal cannula oxygen  Post-op Assessment: Report given to PACU RN and Post -op Vital signs reviewed and stable  Post vital signs: Reviewed and stable  Complications: No apparent anesthesia complications

## 2016-01-14 NOTE — H&P (View-Only) (Signed)
Urology Admission H&P  Chief Complaint: Left flank pain  History of Present Illness: Savannah Shaw is a 55yo with a hx of nephrolithiasis who develop sever sharp, constant nonradiating left flank pain this morning.  She has had over 20 stone events but has never required surgical intervention. The pain was not relieved in the [facility] with multiple doses of morphine and toradol. CT scan show left 67mm UPJ calculus with moderate hydronephrosis and moderate perinephric stranding.  WBC count is 12.4. UA and micro concerning for infection. No fevers.  Past Medical History  Diagnosis Date  . Hyperlipidemia   . Seizure ([place])   . GERD (gastroesophageal reflux disease)   . Kidney stones    Past Surgical History  Procedure Laterality Date  . Appendectomy    . Cervical fusion    . Cholecystectomy    . Tubal ligation      Bilateral  . Oophorectomy      Right    Home Medications:   (Not in a hospital admission) Allergies:  Allergies  Allergen Reactions  . Terbinafine Hcl Rash  . Topiramate Rash    Family History  Problem Relation Age of Onset  . Diabetes Mother   . Heart disease Mother 66    No details about the type of heart disease.    . Colon cancer Neg Hx   . Prostate cancer Other    Social History:  reports that she has quit smoking. She has never used smokeless tobacco. She reports that she does not drink alcohol or use illicit drugs.  Review of Systems  Constitutional: Positive for chills.  Gastrointestinal: Positive for nausea and vomiting.  Genitourinary: Positive for dysuria, urgency and flank pain.  All other systems reviewed and are negative.   Physical Exam:  Vital signs in last 24 hours: Temp:  [98.1 F (36.7 C)-98.2 F (36.8 C)] 98.1 F (36.7 C) (03/16 1951) Pulse Rate:  [86-95] 86 (03/16 1951) Resp:  [16-20] 16 (03/16 1951) BP: (108-134)/(65-84) 112/68 mmHg (03/16 1951) SpO2:  [94 %-100 %] 96 % (03/16 1951) Weight:  [68.947 kg (152 lb)] 68.947 kg (152  lb) (03/16 1425) Physical Exam  Constitutional: She is oriented to person, place, and time. She appears well-developed and well-nourished.  HENT:  Head: Normocephalic and atraumatic.  Eyes: EOM are normal. Pupils are equal, round, and reactive to light.  Neck: Normal range of motion. No thyromegaly present.  Cardiovascular: Normal rate and regular rhythm.   Respiratory: Effort normal. No respiratory distress.  GI: Soft. She exhibits no distension. There is no tenderness.  Musculoskeletal: Normal range of motion.  Neurological: She is alert and oriented to person, place, and time.  Skin: Skin is warm and dry.  Psychiatric: She has a normal mood and affect. Her behavior is normal. Judgment and thought content normal.    Laboratory Data:  Results for orders placed or performed during the hospital encounter of 12/31/15 (from the past 24 hour(s))  CBC with Differential     Status: Abnormal   Collection Time: 12/31/15  3:44 PM  Result Value Ref Range   WBC 12.4 (H) 4.0 - 10.5 K/uL   RBC 3.99 3.87 - 5.11 MIL/uL   Hemoglobin 13.4 12.0 - 15.0 g/dL   HCT 38.8 36.0 - 46.0 %   MCV 97.2 78.0 - 100.0 fL   MCH 33.6 26.0 - 34.0 pg   MCHC 34.5 30.0 - 36.0 g/dL   RDW 12.7 11.5 - 15.5 %   Platelets 265 150 -  400 K/uL   Neutrophils Relative % 85 %   Neutro Abs 10.6 (H) 1.7 - 7.7 K/uL   Lymphocytes Relative 8 %   Lymphs Abs 1.0 0.7 - 4.0 K/uL   Monocytes Relative 7 %   Monocytes Absolute 0.8 0.1 - 1.0 K/uL   Eosinophils Relative 0 %   Eosinophils Absolute 0.0 0.0 - 0.7 K/uL   Basophils Relative 0 %   Basophils Absolute 0.0 0.0 - 0.1 K/uL  Basic metabolic panel     Status: Abnormal   Collection Time: 12/31/15  3:44 PM  Result Value Ref Range   Sodium 138 135 - 145 mmol/L   Potassium 3.8 3.5 - 5.1 mmol/L   Chloride 107 101 - 111 mmol/L   CO2 24 22 - 32 mmol/L   Glucose, Bld 109 (H) 65 - 99 mg/dL   BUN 12 6 - 20 mg/dL   Creatinine, Ser 0.63 0.44 - 1.00 mg/dL   Calcium 8.5 (L) 8.9 - 10.3  mg/dL   GFR calc non Af Amer >60 >60 mL/min   GFR calc Af Amer >60 >60 mL/min   Anion gap 7 5 - 15  Urinalysis, Routine w reflex microscopic (not at [facility])     Status: Abnormal   Collection Time: 12/31/15  4:00 PM  Result Value Ref Range   Color, Urine YELLOW YELLOW   APPearance HAZY (A) CLEAR   Specific Gravity, Urine 1.020 1.005 - 1.030   pH 5.5 5.0 - 8.0   Glucose, UA NEGATIVE NEGATIVE mg/dL   Hgb urine dipstick MODERATE (A) NEGATIVE   Bilirubin Urine NEGATIVE NEGATIVE   Ketones, ur 15 (A) NEGATIVE mg/dL   Protein, ur TRACE (A) NEGATIVE mg/dL   Nitrite POSITIVE (A) NEGATIVE   Leukocytes, UA LARGE (A) NEGATIVE  Urine microscopic-add on     Status: Abnormal   Collection Time: 12/31/15  4:00 PM  Result Value Ref Range   Squamous Epithelial / LPF 0-5 (A) NONE SEEN   WBC, UA TOO NUMEROUS TO COUNT 0 - 5 WBC/hpf   RBC / HPF 6-30 0 - 5 RBC/hpf   Bacteria, UA MANY (A) NONE SEEN   No results found for this or any previous visit (from the past 240 hour(s)). Creatinine:  Recent Labs  12/31/15 1544  CREATININE 0.63   Baseline Creatinine: unknown  Impression/Assessment:  54yo with left ureteral calculus, leukocytosis, concern for sepsis from urinary source  Plan:  The risks/benefits/alternatives to left ureteral stent placement was explained to the patient and she understands and wishes to proceed with surgery  Slyvia Lartigue L 12/31/2015, 8:38 PM

## 2016-01-15 ENCOUNTER — Encounter (HOSPITAL_BASED_OUTPATIENT_CLINIC_OR_DEPARTMENT_OTHER): Payer: Self-pay | Admitting: Urology

## 2016-01-15 NOTE — Op Note (Signed)
Preoperative diagnosis: leftl renal calculi  Postoperative diagnosis: Same  Procedure: 1 cystoscopy 2. Left retrograde pyelography 3. Intraoperative fluoroscopy, under one hour, with interpretation 4. left ureteroscopic stone manipulation with laser lithotripsy 5. left 6 x 26 JJ stent exchange  Attending: Nicolette Bang  Anesthesia: General  Estimated blood loss: None  Drains: left 6 x 26 JJ ureteral stent without tether  Specimens: stone for analysis  Antibiotics: rocephin  Findings:left lower pole renal calculi. no hydronephrosis. No masses/lesions in the bladder. Ureteral orifices in normal anatomic location.  Indications: Patient is a 55 year old female with a history of left ureteral calculus and sepsis. She is status post stent placement. After discussing treatment options, they decided proceed with left ureteroscopic stone manipulation.  Procedure her in detail: The patient was brought to the operating room and a brief timeout was done to ensure correct patient, correct procedure, correct site. General anesthesia was administered patient was placed in dorsal lithotomy position. Her genitalia was then prepped and draped in usual sterile fashion. A rigid 91 French cystoscope was passed in the urethra and the bladder. Bladder was inspected free masses or lesions. the ureteral orifices were in the normal orthotopic locations. Using a grasper the stent was brought to the urethral meatus. We then advanced a zipwire through the stent and up to the renal pelvis. The stent was then removed. a 6 french ureteral catheter was then instilled into the left ureteral orifice. a gentle retrograde was obtained and findings noted above. we then removed the cystoscope and cannulated the left ureteral orifice with a semirigid ureteroscope. We advanced up to the renbal pelvis and no stone or tumor was encountered. We then advanced a sensor wire into the renal pelvis. We removed the scope and  advanced a 12/14 x 38cm access sheath up to the renal pelvis. We then used the flexible ureteroscope to perform nephroscopy. We located 2 large calculi in the lower pole which were fragmented with a 200nm laser fiber. The fragments were then removed with an NGage basket. Once the stones were removed we removed the access sheath under direct vision and noted no injury to the ureter. We then placed a 6 x 26 double-j ureteral stent over the original zip wire. We then removed the wire and good coil was noted in the the renal pelvis under fluoroscopy and the bladder under direct vision.the bladder was then drained and this concluded the procedure which was well tolerated by patient.  Complications: None  Condition: Stable, extubated, transferred to PACU  Plan: Patient is to be discharged home as to follow-up in 2 weeks for stent removal

## 2016-02-21 ENCOUNTER — Other Ambulatory Visit: Payer: Self-pay | Admitting: Family Medicine

## 2016-02-22 ENCOUNTER — Other Ambulatory Visit: Payer: Self-pay

## 2016-02-22 NOTE — Telephone Encounter (Signed)
Patient of Dr Sabra Heck. Please advise on refill. Looks like she may be due for labs. Route to [facility]

## 2016-02-22 NOTE — Telephone Encounter (Signed)
Rx renewed to pharmacy. Advised that patient needs appt before next refill

## 2016-02-22 NOTE — Telephone Encounter (Signed)
This is okay to refill. Based on a review of her record it appears that she has not had an appointment with a provider in a good while. Please schedule an appointment with the provider for checkup and possible future refills on medication.

## 2016-04-21 ENCOUNTER — Other Ambulatory Visit: Payer: Self-pay | Admitting: Family Medicine

## 2016-04-26 ENCOUNTER — Encounter: Payer: Self-pay | Admitting: Family

## 2016-04-26 ENCOUNTER — Ambulatory Visit (INDEPENDENT_AMBULATORY_CARE_PROVIDER_SITE_OTHER): Payer: Medicaid Other | Admitting: Family

## 2016-04-26 VITALS — BP 119/75 | HR 69 | Temp 97.7°F | Ht 65.0 in | Wt 157.0 lb

## 2016-04-26 DIAGNOSIS — Z1159 Encounter for screening for other viral diseases: Secondary | ICD-10-CM | POA: Diagnosis not present

## 2016-04-26 DIAGNOSIS — G40909 Epilepsy, unspecified, not intractable, without status epilepticus: Secondary | ICD-10-CM

## 2016-04-26 DIAGNOSIS — E663 Overweight: Secondary | ICD-10-CM | POA: Diagnosis not present

## 2016-04-26 DIAGNOSIS — E785 Hyperlipidemia, unspecified: Secondary | ICD-10-CM

## 2016-04-26 DIAGNOSIS — K219 Gastro-esophageal reflux disease without esophagitis: Secondary | ICD-10-CM

## 2016-04-26 MED ORDER — ZONISAMIDE 100 MG PO CAPS: ORAL_CAPSULE | ORAL | Status: DC

## 2016-04-26 MED ORDER — CARBAMAZEPINE 200 MG PO TABS: ORAL_TABLET | ORAL | Status: DC

## 2016-04-26 MED ORDER — PANTOPRAZOLE SODIUM 40 MG PO TBEC: 40.0000 mg | DELAYED_RELEASE_TABLET | Freq: Every day | ORAL | Status: DC

## 2016-04-26 MED ORDER — DIVALPROEX SODIUM 500 MG PO DR TAB: 1000.0000 mg | DELAYED_RELEASE_TABLET | Freq: Two times a day (BID) | ORAL | Status: DC

## 2016-04-26 NOTE — Progress Notes (Signed)
Subjective:    Patient ID: Savannah Shaw, female    DOB: 1961/01/27, 55 y.o.   MRN: [id number]  Pt presents to the office today for chronic follow up. PT currently taking medication for seizures and states it has been 9 years since her last seizure.  Seizures  This is a chronic problem. The problem has been resolved. Associated symptoms include nausea. Pertinent negatives include no headaches.  Gastroesophageal Reflux She complains of belching, heartburn and nausea. This is a chronic problem. The current episode started more than 1 year ago. The problem occurs frequently. The heartburn wakes her from sleep. The heartburn limits her activity. The symptoms are aggravated by lying down and stress. Risk factors include obesity. She has tried a PPI for the symptoms. The treatment provided mild relief.  Hyperlipidemia This is a chronic problem. The current episode started more than 1 year ago. The problem is controlled. Recent lipid tests were reviewed and are normal. Pertinent negatives include no leg pain or shortness of breath. Current antihyperlipidemic treatment includes diet change and herbal therapy. The current treatment provides moderate improvement of lipids. Risk factors for coronary artery disease include dyslipidemia, obesity and a sedentary lifestyle.      Review of Systems  Constitutional: Negative.   HENT: Negative.   Eyes: Negative.   Respiratory: Negative.  Negative for shortness of breath.   Cardiovascular: Negative.  Negative for palpitations.  Gastrointestinal: Positive for heartburn and nausea.  Endocrine: Negative.   Genitourinary: Negative.   Musculoskeletal: Negative.   Neurological: Positive for seizures. Negative for headaches.  Hematological: Negative.   Psychiatric/Behavioral: Negative.   All other systems reviewed and are negative.      Objective:   Physical Exam  Constitutional: She is oriented to person, place, and time. She appears well-developed and  well-nourished. No distress.  HENT:  Head: Normocephalic and atraumatic.  Right Ear: External ear normal.  Left Ear: External ear normal.  Nose: Nose normal.  Mouth/Throat: Oropharynx is clear and moist.  Eyes: Pupils are equal, round, and reactive to light.  Neck: Normal range of motion. Neck supple. No thyromegaly present.  Cardiovascular: Normal rate, regular rhythm, normal heart sounds and intact distal pulses.   No murmur heard. Pulmonary/Chest: Effort normal and breath sounds normal. No respiratory distress. She has no wheezes.  Abdominal: Soft. Bowel sounds are normal. She exhibits no distension. There is no tenderness.  Musculoskeletal: Normal range of motion. She exhibits no edema or tenderness.  Neurological: She is alert and oriented to person, place, and time. She has normal reflexes. No cranial nerve deficit.  Skin: Skin is warm and dry.  Psychiatric: She has a normal mood and affect. Her behavior is normal. Judgment and thought content normal.  Vitals reviewed.     BP 119/75 mmHg  Pulse 69  Temp(Src) 97.7 F (36.5 C)  Ht '5\' 5"'$  (1.651 m)  Wt 157 lb (71.215 kg)  BMI 26.13 kg/m2     Assessment & Plan:  1. Seizure disorder (HCC) - CMP14+EGFR - Valproic acid level  2. Hyperlipidemia - CMP14+EGFR - Lipid panel  3. Gastroesophageal reflux disease, esophagitis presence not specified -Protonix increased to 40 mg from 20 mg -Diet discussed - CMP14+EGFR - pantoprazole (PROTONIX) 40 MG tablet; Take 1 tablet (40 mg total) by mouth daily.  Dispense: 30 tablet; Refill: 3  4. Need for hepatitis C screening test - CMP14+EGFR - Hepatitis C Antibody   Continue all meds Labs pending Health Maintenance reviewed Diet and exercise encouraged RTO  6 months  Evelina Dun, FNP

## 2016-04-26 NOTE — Addendum Note (Signed)
Addended by: Evelina Dun A on: 04/26/2016 03:13 PM   Modules accepted: Orders

## 2016-04-26 NOTE — Patient Instructions (Signed)
Health Maintenance, Female Adopting a healthy lifestyle and getting preventive care can go a long way to promote health and wellness. Talk with your health care provider about what schedule of regular examinations is right for you. This is a good chance for you to check in with your provider about disease prevention and staying healthy. In between checkups, there are plenty of things you can do on your own. Experts have done a lot of research about which lifestyle changes and preventive measures are most likely to keep you healthy. Ask your health care provider for more information. WEIGHT AND DIET  Eat a healthy diet  Be sure to include plenty of vegetables, fruits, low-fat dairy products, and lean protein.  Do not eat a lot of foods high in solid fats, added sugars, or salt.  Get regular exercise. This is one of the most important things you can do for your health.  Most adults should exercise for at least 150 minutes each week. The exercise should increase your heart rate and make you sweat (moderate-intensity exercise).  Most adults should also do strengthening exercises at least twice a week. This is in addition to the moderate-intensity exercise.  Maintain a healthy weight  Body mass index (BMI) is a measurement that can be used to identify possible weight problems. It estimates body fat based on height and weight. Your health care provider can help determine your BMI and help you achieve or maintain a healthy weight.  For females 20 years of age and older:   A BMI below 18.5 is considered underweight.  A BMI of 18.5 to 24.9 is normal.  A BMI of 25 to 29.9 is considered overweight.  A BMI of 30 and above is considered obese.  Watch levels of cholesterol and blood lipids  You should start having your blood tested for lipids and cholesterol at 55 years of age, then have this test every 5 years.  You may need to have your cholesterol levels checked more often if:  Your lipid  or cholesterol levels are high.  You are older than 55 years of age.  You are at high risk for heart disease.  CANCER SCREENING   Lung Cancer  Lung cancer screening is recommended for adults 55-80 years old who are at high risk for lung cancer because of a history of smoking.  A yearly low-dose CT scan of the lungs is recommended for people who:  Currently smoke.  Have quit within the past 15 years.  Have at least a 30-pack-year history of smoking. A pack year is smoking an average of one pack of cigarettes a day for 1 year.  Yearly screening should continue until it has been 15 years since you quit.  Yearly screening should stop if you develop a health problem that would prevent you from having lung cancer treatment.  Breast Cancer  Practice breast self-awareness. This means understanding how your breasts normally appear and feel.  It also means doing regular breast self-exams. Let your health care provider know about any changes, no matter how small.  If you are in your 20s or 30s, you should have a clinical breast exam (CBE) by a health care provider every 1-3 years as part of a regular health exam.  If you are 40 or older, have a CBE every year. Also consider having a breast X-ray (mammogram) every year.  If you have a family history of breast cancer, talk to your health care provider about genetic screening.  If you   are at high risk for breast cancer, talk to your health care provider about having an MRI and a mammogram every year.  Breast cancer gene (BRCA) assessment is recommended for women who have family members with BRCA-related cancers. BRCA-related cancers include:  Breast.  Ovarian.  Tubal.  Peritoneal cancers.  Results of the assessment will determine the need for genetic counseling and BRCA1 and BRCA2 testing. Cervical Cancer Your health care provider may recommend that you be screened regularly for cancer of the pelvic organs (ovaries, uterus, and  vagina). This screening involves a pelvic examination, including checking for microscopic changes to the surface of your cervix (Pap test). You may be encouraged to have this screening done every 3 years, beginning at age 21.  For women ages 30-65, health care providers may recommend pelvic exams and Pap testing every 3 years, or they may recommend the Pap and pelvic exam, combined with testing for human papilloma virus (HPV), every 5 years. Some types of HPV increase your risk of cervical cancer. Testing for HPV may also be done on women of any age with unclear Pap test results.  Other health care providers may not recommend any screening for nonpregnant women who are considered low risk for pelvic cancer and who do not have symptoms. Ask your health care provider if a screening pelvic exam is right for you.  If you have had past treatment for cervical cancer or a condition that could lead to cancer, you need Pap tests and screening for cancer for at least 20 years after your treatment. If Pap tests have been discontinued, your risk factors (such as having a new sexual partner) need to be reassessed to determine if screening should resume. Some women have medical problems that increase the chance of getting cervical cancer. In these cases, your health care provider may recommend more frequent screening and Pap tests. Colorectal Cancer  This type of cancer can be detected and often prevented.  Routine colorectal cancer screening usually begins at 55 years of age and continues through 55 years of age.  Your health care provider may recommend screening at an earlier age if you have risk factors for colon cancer.  Your health care provider may also recommend using home test kits to check for hidden blood in the stool.  A small camera at the end of a tube can be used to examine your colon directly (sigmoidoscopy or colonoscopy). This is done to check for the earliest forms of colorectal  cancer.  Routine screening usually begins at age 50.  Direct examination of the colon should be repeated every 5-10 years through 55 years of age. However, you may need to be screened more often if early forms of precancerous polyps or small growths are found. Skin Cancer  Check your skin from head to toe regularly.  Tell your health care provider about any new moles or changes in moles, especially if there is a change in a mole's shape or color.  Also tell your health care provider if you have a mole that is larger than the size of a pencil eraser.  Always use sunscreen. Apply sunscreen liberally and repeatedly throughout the day.  Protect yourself by wearing long sleeves, pants, a wide-brimmed hat, and sunglasses whenever you are outside. HEART DISEASE, DIABETES, AND HIGH BLOOD PRESSURE   High blood pressure causes heart disease and increases the risk of stroke. High blood pressure is more likely to develop in:  People who have blood pressure in the high end   of the normal range (130-139/85-89 mm Hg).  People who are overweight or obese.  People who are African American.  If you are 38-23 years of age, have your blood pressure checked every 3-5 years. If you are 61 years of age or older, have your blood pressure checked every year. You should have your blood pressure measured twice--once when you are at a hospital or clinic, and once when you are not at a hospital or clinic. Record the average of the two measurements. To check your blood pressure when you are not at a hospital or clinic, you can use:  An automated blood pressure machine at a pharmacy.  A home blood pressure monitor.  If you are between 45 years and 39 years old, ask your health care provider if you should take aspirin to prevent strokes.  Have regular diabetes screenings. This involves taking a blood sample to check your fasting blood sugar level.  If you are at a normal weight and have a low risk for diabetes,  have this test once every three years after 55 years of age.  If you are overweight and have a high risk for diabetes, consider being tested at a younger age or more often. PREVENTING INFECTION  Hepatitis B  If you have a higher risk for hepatitis B, you should be screened for this virus. You are considered at high risk for hepatitis B if:  You were born in a country where hepatitis B is common. Ask your health care provider which countries are considered high risk.  Your parents were born in a high-risk country, and you have not been immunized against hepatitis B (hepatitis B vaccine).  You have HIV or AIDS.  You use needles to inject street drugs.  You live with someone who has hepatitis B.  You have had sex with someone who has hepatitis B.  You get hemodialysis treatment.  You take certain medicines for conditions, including cancer, organ transplantation, and autoimmune conditions. Hepatitis C  Blood testing is recommended for:  Everyone born from [birth year] through 1965.  Anyone with known risk factors for hepatitis C. Sexually transmitted infections (STIs)  You should be screened for sexually transmitted infections (STIs) including gonorrhea and chlamydia if:  You are sexually active and are younger than 55 years of age.  You are older than 55 years of age and your health care provider tells you that you are at risk for this type of infection.  Your sexual activity has changed since you were last screened and you are at an increased risk for chlamydia or gonorrhea. Ask your health care provider if you are at risk.  If you do not have HIV, but are at risk, it may be recommended that you take a prescription medicine daily to prevent HIV infection. This is called pre-exposure prophylaxis (PrEP). You are considered at risk if:  You are sexually active and do not regularly use condoms or know the HIV status of your partner(s).  You take drugs by injection.  You are sexually  active with a partner who has HIV. Talk with your health care provider about whether you are at high risk of being infected with HIV. If you choose to begin PrEP, you should first be tested for HIV. You should then be tested every 3 months for as long as you are taking PrEP.  PREGNANCY   If you are premenopausal and you may become pregnant, ask your health care provider about preconception counseling.  If you may  become pregnant, take 400 to 800 micrograms (mcg) of folic acid every day.  If you want to prevent pregnancy, talk to your health care provider about birth control (contraception). OSTEOPOROSIS AND MENOPAUSE   Osteoporosis is a disease in which the bones lose minerals and strength with aging. This can result in serious bone fractures. Your risk for osteoporosis can be identified using a bone density scan.  If you are 61 years of age or older, or if you are at risk for osteoporosis and fractures, ask your health care provider if you should be screened.  Ask your health care provider whether you should take a calcium or vitamin D supplement to lower your risk for osteoporosis.  Menopause may have certain physical symptoms and risks.  Hormone replacement therapy may reduce some of these symptoms and risks. Talk to your health care provider about whether hormone replacement therapy is right for you.  HOME CARE INSTRUCTIONS   Schedule regular health, dental, and eye exams.  Stay current with your immunizations.   Do not use any tobacco products including cigarettes, chewing tobacco, or electronic cigarettes.  If you are pregnant, do not drink alcohol.  If you are breastfeeding, limit how much and how often you drink alcohol.  Limit alcohol intake to no more than 1 drink per day for nonpregnant women. One drink equals 12 ounces of beer, 5 ounces of wine, or 1 ounces of hard liquor.  Do not use street drugs.  Do not share needles.  Ask your health care provider for help if  you need support or information about quitting drugs.  Tell your health care provider if you often feel depressed.  Tell your health care provider if you have ever been abused or do not feel safe at home.   This information is not intended to replace advice given to you by your health care provider. Make sure you discuss any questions you have with your health care provider.   Document Released: 04/18/2011 Document Revised: 10/24/2014 Document Reviewed: 09/04/2013 Elsevier Interactive Patient Education [employer].

## 2016-04-27 ENCOUNTER — Other Ambulatory Visit: Payer: Self-pay | Admitting: Family

## 2016-04-27 LAB — CMP14+EGFR
ALK PHOS: 56 IU/L (ref 39–117)
ALT: 8 IU/L (ref 0–32)
AST: 19 IU/L (ref 0–40)
Albumin/Globulin Ratio: 1.6 (ref 1.2–2.2)
Albumin: 4.2 g/dL (ref 3.5–5.5)
BUN/Creatinine Ratio: 25 — ABNORMAL HIGH (ref 9–23)
BUN: 13 mg/dL (ref 6–24)
Bilirubin Total: <0.2 mg/dL (ref 0.0–1.2)
CALCIUM: 8.8 mg/dL (ref 8.7–10.2)
CO2: 22 mmol/L (ref 18–29)
CREATININE: 0.52 mg/dL — AB (ref 0.57–1.00)
Chloride: 98 mmol/L (ref 96–106)
GFR calc Af Amer: 124 mL/min/{1.73_m2} (ref >59)
GFR, EST NON AFRICAN AMERICAN: 108 mL/min/{1.73_m2} (ref >59)
GLOBULIN, TOTAL: 2.6 g/dL (ref 1.5–4.5)
GLUCOSE: 85 mg/dL (ref 65–99)
Potassium: 4.4 mmol/L (ref 3.5–5.2)
SODIUM: 137 mmol/L (ref 134–144)
Total Protein: 6.8 g/dL (ref 6.0–8.5)

## 2016-04-27 LAB — LIPID PANEL
CHOL/HDL RATIO: 2.8 ratio (ref 0.0–4.4)
CHOLESTEROL TOTAL: 227 mg/dL — AB (ref 100–199)
HDL: 81 mg/dL (ref >39)
LDL CALC: 125 mg/dL — AB (ref 0–99)
TRIGLYCERIDES: 107 mg/dL (ref 0–149)
VLDL CHOLESTEROL CAL: 21 mg/dL (ref 5–40)

## 2016-04-27 LAB — HEPATITIS C ANTIBODY: Hep C Virus Ab: <0.1 s/co ratio (ref 0.0–0.9)

## 2016-04-27 LAB — VALPROIC ACID LEVEL: Valproic Acid Lvl: 75 ug/mL (ref 50–100)

## 2016-04-27 MED ORDER — SIMVASTATIN 20 MG PO TABS: 20.0000 mg | ORAL_TABLET | Freq: Every day | ORAL | Status: DC

## 2016-05-23 ENCOUNTER — Other Ambulatory Visit: Payer: Self-pay | Admitting: Family Medicine

## 2016-05-24 ENCOUNTER — Telehealth: Payer: Self-pay | Admitting: Family

## 2016-05-24 MED ORDER — DEPAKOTE 500 MG PO TBEC: 1000.0000 mg | DELAYED_RELEASE_TABLET | Freq: Two times a day (BID) | ORAL | 3 refills | Status: DC

## 2016-05-24 NOTE — Telephone Encounter (Signed)
Per [facility] for Depakote needs to be printed and faxed stating brand name only  RX printed [facility] faxed to [facility]

## 2016-07-21 ENCOUNTER — Other Ambulatory Visit: Payer: Self-pay | Admitting: Family

## 2016-07-30 ENCOUNTER — Ambulatory Visit (INDEPENDENT_AMBULATORY_CARE_PROVIDER_SITE_OTHER): Payer: Medicaid Other

## 2016-07-30 DIAGNOSIS — Z23 Encounter for immunization: Secondary | ICD-10-CM

## 2016-09-05 ENCOUNTER — Encounter: Payer: Medicaid Other | Admitting: *Deleted

## 2016-09-19 ENCOUNTER — Other Ambulatory Visit: Payer: Self-pay | Admitting: Family

## 2016-09-19 DIAGNOSIS — K219 Gastro-esophageal reflux disease without esophagitis: Secondary | ICD-10-CM

## 2016-09-19 NOTE — Telephone Encounter (Signed)
Patient last seen 04/26/16, please advise

## 2016-10-20 ENCOUNTER — Other Ambulatory Visit: Payer: Self-pay | Admitting: Family

## 2016-12-16 ENCOUNTER — Other Ambulatory Visit: Payer: Self-pay | Admitting: Family

## 2017-01-16 ENCOUNTER — Other Ambulatory Visit: Payer: Self-pay | Admitting: Family

## 2017-01-16 DIAGNOSIS — K219 Gastro-esophageal reflux disease without esophagitis: Secondary | ICD-10-CM

## 2017-02-15 ENCOUNTER — Other Ambulatory Visit: Payer: Self-pay | Admitting: Family

## 2017-02-15 ENCOUNTER — Telehealth: Payer: Self-pay | Admitting: Family

## 2017-02-15 DIAGNOSIS — K219 Gastro-esophageal reflux disease without esophagitis: Secondary | ICD-10-CM

## 2017-02-15 MED ORDER — DEPAKOTE 500 MG PO TBEC: 1000.0000 mg | DELAYED_RELEASE_TABLET | Freq: Two times a day (BID) | ORAL | 1 refills | Status: DC

## 2017-02-15 NOTE — Telephone Encounter (Signed)
Prescription sent to pharmacy.

## 2017-02-15 NOTE — Telephone Encounter (Signed)
Please advise 

## 2017-02-15 NOTE — Telephone Encounter (Signed)
What is the name of the medication? Depakote  Have you contacted your pharmacy to request a refill? YES  Which pharmacy would you like this sent to? [facility] [place] [facility] so Medicaid will pay for it.   Patient notified that their request is being sent to the clinical staff for review and that they should receive a call once it is complete. If they do not receive a call within 24 hours they can check with their pharmacy or our office.

## 2017-04-01 ENCOUNTER — Other Ambulatory Visit: Payer: Self-pay | Admitting: Family

## 2017-04-01 DIAGNOSIS — K219 Gastro-esophageal reflux disease without esophagitis: Secondary | ICD-10-CM

## 2017-04-02 ENCOUNTER — Other Ambulatory Visit: Payer: Self-pay | Admitting: Family

## 2017-04-03 NOTE — Telephone Encounter (Signed)
Last seen 05/05/16

## 2017-04-03 NOTE — Telephone Encounter (Signed)
Patient NTBS for follow up and lab work  

## 2017-04-03 NOTE — Telephone Encounter (Signed)
Phone busy.

## 2017-04-04 NOTE — Telephone Encounter (Signed)
[facility] NTBS before next refill

## 2017-04-07 ENCOUNTER — Other Ambulatory Visit: Payer: Self-pay | Admitting: Family

## 2017-04-07 NOTE — Telephone Encounter (Signed)
Last seen 07/17

## 2017-04-25 ENCOUNTER — Other Ambulatory Visit: Payer: Self-pay | Admitting: Family

## 2017-05-02 ENCOUNTER — Ambulatory Visit: Payer: Medicaid Other | Admitting: Nurse Practitioner

## 2017-05-03 ENCOUNTER — Encounter: Payer: Self-pay | Admitting: Nurse Practitioner

## 2017-05-03 ENCOUNTER — Ambulatory Visit (INDEPENDENT_AMBULATORY_CARE_PROVIDER_SITE_OTHER): Payer: Medicaid Other | Admitting: Nurse Practitioner

## 2017-05-03 VITALS — BP 111/71 | HR 68 | Temp 97.4°F | Ht 65.0 in | Wt 154.0 lb

## 2017-05-03 DIAGNOSIS — E663 Overweight: Secondary | ICD-10-CM

## 2017-05-03 DIAGNOSIS — D529 Folate deficiency anemia, unspecified: Secondary | ICD-10-CM

## 2017-05-03 DIAGNOSIS — E782 Mixed hyperlipidemia: Secondary | ICD-10-CM

## 2017-05-03 DIAGNOSIS — G40909 Epilepsy, unspecified, not intractable, without status epilepticus: Secondary | ICD-10-CM | POA: Diagnosis not present

## 2017-05-03 DIAGNOSIS — K219 Gastro-esophageal reflux disease without esophagitis: Secondary | ICD-10-CM

## 2017-05-03 MED ORDER — ZONISAMIDE 100 MG PO CAPS: ORAL_CAPSULE | ORAL | 5 refills | Status: DC

## 2017-05-03 MED ORDER — DEPAKOTE 500 MG PO TBEC: 1000.0000 mg | DELAYED_RELEASE_TABLET | Freq: Two times a day (BID) | ORAL | 5 refills | Status: DC

## 2017-05-03 MED ORDER — CARBAMAZEPINE 200 MG PO TABS
ORAL_TABLET | ORAL | 5 refills | Status: DC
Start: 2017-05-03 — End: 2017-11-17

## 2017-05-03 MED ORDER — PANTOPRAZOLE SODIUM 40 MG PO TBEC: 40.0000 mg | DELAYED_RELEASE_TABLET | Freq: Every day | ORAL | 5 refills | Status: DC

## 2017-05-03 NOTE — Patient Instructions (Signed)
Anemia, Nonspecific Anemia is a condition in which the concentration of red blood cells or hemoglobin in the blood is below normal. Hemoglobin is a substance in red blood cells that carries oxygen to the tissues of the body. Anemia results in not enough oxygen reaching these tissues. What are the causes? Common causes of anemia include:  Excessive bleeding. Bleeding may be internal or external. This includes excessive bleeding from periods (in women) or from the intestine.  Poor nutrition.  Chronic kidney, thyroid, and liver disease.  Bone marrow disorders that decrease red blood cell production.  Cancer and treatments for cancer.  HIV, AIDS, and their treatments.  Spleen problems that increase red blood cell destruction.  Blood disorders.  Excess destruction of red blood cells due to infection, medicines, and autoimmune disorders. What are the signs or symptoms?  Minor weakness.  Dizziness.  Headache.  Palpitations.  Shortness of breath, especially with exercise.  Paleness.  Cold sensitivity.  Indigestion.  Nausea.  Difficulty sleeping.  Difficulty concentrating. Symptoms may occur suddenly or they may develop slowly. How is this diagnosed? Additional blood tests are often needed. These help your health care provider determine the best treatment. Your health care provider will check your stool for blood and look for other causes of blood loss. How is this treated? Treatment varies depending on the cause of the anemia. Treatment can include:  Supplements of iron, vitamin B12, or folic acid.  Hormone medicines.  A blood transfusion. This may be needed if blood loss is severe.  Hospitalization. This may be needed if there is significant continual blood loss.  Dietary changes.  Spleen removal. Follow these instructions at home: Keep all follow-up appointments. It often takes many weeks to correct anemia, and having your health care provider check on your  condition and your response to treatment is very important. Get help right away if:  You develop extreme weakness, shortness of breath, or chest pain.  You become dizzy or have trouble concentrating.  You develop heavy vaginal bleeding.  You develop a rash.  You have bloody or black, tarry stools.  You faint.  You vomit up blood.  You vomit repeatedly.  You have abdominal pain.  You have a fever or persistent symptoms for more than 2-3 days.  You have a fever and your symptoms suddenly get worse.  You are dehydrated. This information is not intended to replace advice given to you by your health care provider. Make sure you discuss any questions you have with your health care provider. Document Released: 11/10/2004 Document Revised: 03/16/2016 Document Reviewed: 03/29/2013 Elsevier Interactive Patient Education  2017 [employer]  

## 2017-05-03 NOTE — Progress Notes (Signed)
Subjective:    Patient ID: Savannah Shaw, female    DOB: 1961/03/21, 56 y.o.   MRN: [id number]  HPI Savannah Shaw is here today for follow up of chronic medical problem.  Outpatient Encounter Prescriptions as of 05/03/2017  Medication Sig  . carbamazepine (TEGRETOL) 200 MG tablet TAKE 1 AND 1/2 TABLETS IN THE MORNING, 1 TABLET AT LUNCH AND 1 AND 1/2 TABLETS IN THE EVENING  . DEPAKOTE 500 MG DR tablet TAKE 2 TABLETS (1,000 MG TOTAL) BY MOUTH 2 (TWO) TIMES DAILY.  . fish oil-omega-3 fatty acids 1000 MG capsule Take 1 capsule by mouth daily.    . pantoprazole (PROTONIX) 40 MG tablet TAKE 1 TABLET (40 MG TOTAL) BY MOUTH DAILY.  Marland Kitchen zonisamide (ZONEGRAN) 100 MG capsule TAKE 1 CAPSULE (100 MG TOTAL) BY MOUTH DAILY.  . simvastatin (ZOCOR) 20 MG tablet Take 1 tablet (20 mg total) by mouth at bedtime. (Patient not taking: Reported on 05/03/2017)   No facility-administered encounter medications on file as of 05/03/2017.     1. Gastroesophageal reflux disease, esophagitis presence not specified  Takes protonix daily- keeps symptoms under control  2. Seizure disorder ([place])  takes tegratol, depakote and zonegran dialy- has had no seizure activity in several years  3. Overweight (BMI 25.0-29.9)  No recent weight changes  4. Mixed hyperlipidemia  Does not watch diet. Refuses to take simvaststin  5. Anemia due to folic acid deficiency, unspecified deficiency type  Will check labs today    New complaints: none  Social history: No recent changes to her family life   Review of Systems  Constitutional: Negative for activity change and appetite change.  HENT: Negative.   Eyes: Negative for pain.  Respiratory: Negative for shortness of breath.   Cardiovascular: Negative for chest pain, palpitations and leg swelling.  Gastrointestinal: Negative for abdominal pain.  Endocrine: Negative for polydipsia.  Genitourinary: Negative.   Skin: Negative for rash.  Neurological: Negative for dizziness,  weakness and headaches.  Hematological: Does not bruise/bleed easily.  Psychiatric/Behavioral: Negative.   All other systems reviewed and are negative.      Objective:   Physical Exam  Constitutional: She is oriented to person, place, and time. She appears well-developed and well-nourished.  HENT:  Nose: Nose normal.  Mouth/Throat: Oropharynx is clear and moist.  Eyes: EOM are normal.  Neck: Trachea normal, normal range of motion and full passive range of motion without pain. Neck supple. No JVD present. Carotid bruit is not present. No thyromegaly present.  Cardiovascular: Normal rate, regular rhythm, normal heart sounds and intact distal pulses.  Exam reveals no gallop and no friction rub.   No murmur heard. Pulmonary/Chest: Effort normal and breath sounds normal.  Abdominal: Soft. Bowel sounds are normal. She exhibits no distension and no mass. There is no tenderness.  Musculoskeletal: Normal range of motion.  Lymphadenopathy:    She has no cervical adenopathy.  Neurological: She is alert and oriented to person, place, and time. She has normal reflexes.  Skin: Skin is warm and dry.  Psychiatric: She has a normal mood and affect. Her behavior is normal. Judgment and thought content normal.   BP 111/71   Pulse 68   Temp (!) 97.4 F (36.3 C) (Oral)   Ht 5' 5" (1.651 m)   Wt 154 lb (69.9 kg)   BMI 25.63 kg/m       Assessment & Plan:  1. Gastroesophageal reflux disease, esophagitis presence not specified Avoid spicy foods Do not eat  2 hours prior to bedtime - pantoprazole (PROTONIX) 40 MG tablet; Take 1 tablet (40 mg total) by mouth daily.  Dispense: 30 tablet; Refill: 5  2. Seizure disorder ([place]) Report any seizure activity - carbamazepine (TEGRETOL) 200 MG tablet; TAKE 1 AND 1/2 TABLETS IN THE MORNING, 1 TABLET AT LUNCH AND 1 AND 1/2 TABLETS IN THE EVENING  Dispense: 120 tablet; Refill: 5 - DEPAKOTE 500 MG DR tablet; Take 2 tablets (1,000 mg total) by mouth 2 (two) times  daily.  Dispense: 120 tablet; Refill: 5 - zonisamide (ZONEGRAN) 100 MG capsule; TAKE 1 CAPSULE (100 MG TOTAL) BY MOUTH DAILY.  Dispense: 30 capsule; Refill: 5  3. Overweight (BMI 25.0-29.9) Discussed diet and exercise for person with BMI >25 Will recheck weight in 3-6 months  4. Mixed hyperlipidemia Low fta diet - CMP14+EGFR - Lipid panel  5. Anemia due to folic acid deficiency, unspecified deficiency type Labs pending - Anemia Profile B    Labs pending Health maintenance reviewed Diet and exercise encouraged Continue all meds Follow up  In 6 months   Wing, FNP

## 2017-05-04 LAB — ANEMIA PROFILE B
Basophils Absolute: 0 10*3/uL (ref 0.0–0.2)
Basos: 1 %
EOS (ABSOLUTE): 0.1 10*3/uL (ref 0.0–0.4)
Eos: 1 %
FERRITIN: 76 ng/mL (ref 15–150)
FOLATE: 7.2 ng/mL (ref >3.0)
HEMATOCRIT: 38 % (ref 34.0–46.6)
Hemoglobin: 12.8 g/dL (ref 11.1–15.9)
IMMATURE GRANS (ABS): 0 10*3/uL (ref 0.0–0.1)
IMMATURE GRANULOCYTES: 0 %
Iron Saturation: 36 % (ref 15–55)
Iron: 111 ug/dL (ref 27–159)
LYMPHS: 49 %
Lymphocytes Absolute: 2 10*3/uL (ref 0.7–3.1)
MCH: 33.2 pg — ABNORMAL HIGH (ref 26.6–33.0)
MCHC: 33.7 g/dL (ref 31.5–35.7)
MCV: 98 fL — ABNORMAL HIGH (ref 79–97)
MONOCYTES: 10 %
MONOS ABS: 0.4 10*3/uL (ref 0.1–0.9)
Neutrophils Absolute: 1.6 10*3/uL (ref 1.4–7.0)
Neutrophils: 39 %
PLATELETS: 266 10*3/uL (ref 150–379)
RBC: 3.86 x10E6/uL (ref 3.77–5.28)
RDW: 13.8 % (ref 12.3–15.4)
RETIC CT PCT: 1 % (ref 0.6–2.6)
Total Iron Binding Capacity: 308 ug/dL (ref 250–450)
UIBC: 197 ug/dL (ref 131–425)
VITAMIN B 12: 494 pg/mL (ref 232–1245)
WBC: 4.1 10*3/uL (ref 3.4–10.8)

## 2017-05-04 LAB — CMP14+EGFR
A/G RATIO: 2 (ref 1.2–2.2)
ALT: 9 IU/L (ref 0–32)
AST: 16 IU/L (ref 0–40)
Albumin: 4.3 g/dL (ref 3.5–5.5)
Alkaline Phosphatase: 58 IU/L (ref 39–117)
BUN/Creatinine Ratio: 19 (ref 9–23)
BUN: 11 mg/dL (ref 6–24)
Bilirubin Total: <0.2 mg/dL (ref 0.0–1.2)
CO2: 20 mmol/L (ref 20–29)
CREATININE: 0.58 mg/dL (ref 0.57–1.00)
Calcium: 8.8 mg/dL (ref 8.7–10.2)
Chloride: 98 mmol/L (ref 96–106)
GFR calc non Af Amer: 103 mL/min/{1.73_m2} (ref >59)
GFR, EST AFRICAN AMERICAN: 119 mL/min/{1.73_m2} (ref >59)
Globulin, Total: 2.2 g/dL (ref 1.5–4.5)
Glucose: 91 mg/dL (ref 65–99)
POTASSIUM: 4.4 mmol/L (ref 3.5–5.2)
Sodium: 133 mmol/L — ABNORMAL LOW (ref 134–144)
TOTAL PROTEIN: 6.5 g/dL (ref 6.0–8.5)

## 2017-05-04 LAB — LIPID PANEL
CHOL/HDL RATIO: 3.7 ratio (ref 0.0–4.4)
Cholesterol, Total: 235 mg/dL — ABNORMAL HIGH (ref 100–199)
HDL: 63 mg/dL (ref >39)
LDL CALC: 149 mg/dL — AB (ref 0–99)
Triglycerides: 113 mg/dL (ref 0–149)
VLDL CHOLESTEROL CAL: 23 mg/dL (ref 5–40)

## 2017-05-04 LAB — VALPROIC ACID LEVEL: VALPROIC ACID LVL: 61 ug/mL (ref 50–100)

## 2017-07-17 ENCOUNTER — Other Ambulatory Visit: Payer: Self-pay | Admitting: Family

## 2017-08-21 ENCOUNTER — Encounter: Payer: Self-pay | Admitting: Family Medicine

## 2017-08-21 ENCOUNTER — Ambulatory Visit (INDEPENDENT_AMBULATORY_CARE_PROVIDER_SITE_OTHER): Payer: Medicaid Other | Admitting: Family Medicine

## 2017-08-21 VITALS — BP 126/69 | HR 76 | Temp 97.3°F | Ht 65.0 in | Wt 159.0 lb

## 2017-08-21 DIAGNOSIS — J329 Chronic sinusitis, unspecified: Secondary | ICD-10-CM

## 2017-08-21 DIAGNOSIS — J40 Bronchitis, not specified as acute or chronic: Secondary | ICD-10-CM

## 2017-08-21 MED ORDER — HYDROCODONE-HOMATROPINE 5-1.5 MG/5ML PO SYRP: 5.0000 mL | ORAL_SOLUTION | Freq: Four times a day (QID) | ORAL | 0 refills | Status: DC | PRN

## 2017-08-21 MED ORDER — AMOXICILLIN-POT CLAVULANATE 875-125 MG PO TABS: 1.0000 | ORAL_TABLET | Freq: Two times a day (BID) | ORAL | 0 refills | Status: DC

## 2017-08-21 NOTE — Progress Notes (Signed)
Chief Complaint  Patient presents with  . Cough    pt here today c/o cough, congestion, teeth hurting from pressure, sinus pressure and feel awful.    HPI  Patient presents today for Patient presents with upper respiratory congestion. Rhinorrhea that is frequently purulent. There is moderate sore throat. Patient reports coughing frequently as well.  green sputum noted. There is no fever, chills or sweats. The patient denies being short of breath. Onset was 3 days ago. Gradually worsening. Tried OTCs without improvement.  PMH: Smoking status noted ROS: Per HPI  Objective: BP 126/69   Pulse 76   Temp (!) 97.3 F (36.3 C) (Oral)   Ht 5\' 5"  (1.651 m)   Wt 159 lb (72.1 kg)   BMI 26.46 kg/m  Gen: NAD, alert, cooperative with exam HEENT: NCAT, Nasal passages swollen, red TMS nml sinuses tender (B Max) CV: RRR, good S1/S2, no murmur Resp: Bronchitis changes with scattered wheezes, non-labored Ext: No edema, warm Neuro: Alert and oriented, No gross deficits  Assessment and plan:  1. Sinobronchitis     Meds ordered this encounter  Medications  . amoxicillin-clavulanate (AUGMENTIN) 875-125 MG tablet    Sig: Take 1 tablet 2 (two) times daily by mouth. Take all of this medication    Dispense:  20 tablet    Refill:  0  . HYDROcodone-homatropine (HYCODAN) 5-1.5 MG/5ML syrup    Sig: Take 5 mLs every 6 (six) hours as needed by mouth for cough.    Dispense:  120 mL    Refill:  0    No orders of the defined types were placed in this encounter.   Follow up as needed.  Claretta Fraise, MD

## 2017-09-11 ENCOUNTER — Ambulatory Visit: Payer: Medicaid Other | Admitting: Family Medicine

## 2017-09-11 ENCOUNTER — Ambulatory Visit (INDEPENDENT_AMBULATORY_CARE_PROVIDER_SITE_OTHER): Payer: Medicaid Other

## 2017-09-11 ENCOUNTER — Encounter: Payer: Self-pay | Admitting: Family Medicine

## 2017-09-11 VITALS — BP 132/75 | HR 81 | Temp 97.1°F | Ht 65.0 in | Wt 157.0 lb

## 2017-09-11 DIAGNOSIS — Z87442 Personal history of urinary calculi: Secondary | ICD-10-CM

## 2017-09-11 DIAGNOSIS — M546 Pain in thoracic spine: Secondary | ICD-10-CM

## 2017-09-11 DIAGNOSIS — N3001 Acute cystitis with hematuria: Secondary | ICD-10-CM

## 2017-09-11 LAB — URINALYSIS, COMPLETE
BILIRUBIN UA: NEGATIVE
Glucose, UA: NEGATIVE
Nitrite, UA: NEGATIVE
Protein, UA: NEGATIVE
SPEC GRAV UA: 1.02 (ref 1.005–1.030)
UUROB: 0.2 mg/dL (ref 0.2–1.0)
pH, UA: 7 (ref 5.0–7.5)

## 2017-09-11 LAB — MICROSCOPIC EXAMINATION
Renal Epithel, UA: NONE SEEN /hpf
WBC, UA: >30 /hpf — AB (ref 0–?)

## 2017-09-11 MED ORDER — CEPHALEXIN 500 MG PO CAPS: 500.0000 mg | ORAL_CAPSULE | Freq: Four times a day (QID) | ORAL | 0 refills | Status: AC

## 2017-09-11 MED ORDER — ONDANSETRON 4 MG PO TBDP: 4.0000 mg | ORAL_TABLET | Freq: Three times a day (TID) | ORAL | 0 refills | Status: DC | PRN

## 2017-09-11 MED ORDER — CEFTRIAXONE SODIUM 1 G IJ SOLR
1.0000 g | Freq: Once | INTRAMUSCULAR | Status: AC
  Administered 2017-09-11: 1 g via INTRAMUSCULAR

## 2017-09-11 MED ORDER — ACETAMINOPHEN-CODEINE #3 300-30 MG PO TABS: 1.0000 | ORAL_TABLET | Freq: Four times a day (QID) | ORAL | 0 refills | Status: DC | PRN

## 2017-09-11 NOTE — Patient Instructions (Signed)
You are given 1 dose of Rocephin intramuscularly today.  You are being continued on cephalexin to take 4 times daily for the next 7 days.  This will cover for any renal infection.  I have also sent over a urine culture.  If your urine grows something that is resistant to current antibiotics, you will be called and medication will be changed.  I have sent in Zofran ODT for use every 8 hours as needed for nausea or vomiting.  I recommend that you push oral fluids.  I have also ordered an x-ray today to look for kidney stones.  Please schedule an appointment with your urologist with regards to urinary tract infection.   Urinary Tract Infection, Adult A urinary tract infection (UTI) is an infection of any part of the urinary tract. The urinary tract includes the:  Kidneys.  Ureters.  Bladder.  Urethra.  These organs make, store, and get rid of pee (urine) in the body. Follow these instructions at home:  Take over-the-counter and prescription medicines only as told by your doctor.  If you were prescribed an antibiotic medicine, take it as told by your doctor. Do not stop taking the antibiotic even if you start to feel better.  Avoid the following drinks: ? Alcohol. ? Caffeine. ? Tea. ? Carbonated drinks.  Drink enough fluid to keep your pee clear or pale yellow.  Keep all follow-up visits as told by your doctor. This is important.  Make sure to: ? Empty your bladder often and completely. Do not to hold pee for long periods of time. ? Empty your bladder before and after sex. ? Wipe from front to back after a bowel movement if you are female. Use each tissue one time when you wipe. Contact a doctor if:  You have back pain.  You have a fever.  You feel sick to your stomach (nauseous).  You throw up (vomit).  Your symptoms do not get better after 3 days.  Your symptoms go away and then come back. Get help right away if:  You have very bad back pain.  You have very bad  lower belly (abdominal) pain.  You are throwing up and cannot keep down any medicines or water. This information is not intended to replace advice given to you by your health care provider. Make sure you discuss any questions you have with your health care provider. Document Released: 03/21/2008 Document Revised: 03/10/2016 Document Reviewed: 08/24/2015 Elsevier Interactive Patient Education  [employer].

## 2017-09-11 NOTE — Progress Notes (Signed)
Subjective: CC: concern for kidney infection PCP: Janora Norlander, DO GEX:BMWUX Savannah Shaw is a 56 y.o. female presenting to clinic today for:  1. Urinary symptoms  Patient reports a 2 day h/o urinary urgency, urinary frequency, bilateral back pain and abdominal discomfort .  She reports associated nausea without vomiting.  Denies fevers, chills, vomiting, vaginal discharge.  Patient has used Tramadol for back pain.  Patient denies a h/o frequent or recurrent UTIs.  However, she does have a history of urosepsis in March 2017, secondary to infected renal stone which was 11 mm in size.  This required surgical intervention.  Last urine culture was reviewed from March 2017 which grew E. coli that was resistant to multiple drugs.  It was sensitive to Rocephin, Cipro, nitrofurantoin and Septra.   Allergies  Allergen Reactions  . Terbinafine Hcl Rash    Lamasil  . Topamax [Topiramate] Rash   Past Medical History:  Diagnosis Date  . GERD (gastroesophageal reflux disease)   . 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 History of acute pyelonephritis    w/ urosepsis 12-31-2015  resolved  . History of kidney stones   . Hyperlipidemia   . Left ureteral stone   . Normal exercise tolerance test results    12-24-2015 (dr hochrein)  no chest pain, no signficant arrhythmias , 85% max heartrate achieved   Family History  Problem Relation Age of Onset  . Diabetes Mother   . Heart disease Mother 60       No details about the type of heart disease.    . Prostate cancer Other   . Colon cancer Neg Hx     Current Outpatient Medications:  .  carbamazepine (TEGRETOL) 200 MG tablet, TAKE 1 AND 1/2 TABLETS IN THE MORNING, 1 TABLET AT LUNCH AND 1 AND 1/2 TABLETS IN THE EVENING, Disp: 120 tablet, Rfl: 5 .  cephALEXin (KEFLEX) 500 MG capsule, Take 1 capsule (500 mg total) by mouth 4 (four) times daily for 7 days., Disp: 28 capsule,  Rfl: 0 .  DEPAKOTE 500 MG DR tablet, TAKE 2 TABLETS (1,000 MG TOTAL) BY MOUTH 2 (TWO) TIMES DAILY., Disp: 120 tablet, Rfl: 1 .  fish oil-omega-3 fatty acids 1000 MG capsule, Take 1 capsule by mouth daily.  , Disp: , Rfl:  .  ondansetron (ZOFRAN ODT) 4 MG disintegrating tablet, Take 1 tablet (4 mg total) by mouth every 8 (eight) hours as needed for nausea or vomiting., Disp: 20 tablet, Rfl: 0 .  pantoprazole (PROTONIX) 40 MG tablet, Take 1 tablet (40 mg total) by mouth daily., Disp: 30 tablet, Rfl: 5 .  zonisamide (ZONEGRAN) 100 MG capsule, TAKE 1 CAPSULE (100 MG TOTAL) BY MOUTH DAILY., Disp: 30 capsule, Rfl: 5  Social Hx: non smoker. ROS: Per HPI  Objective: Office vital signs reviewed. BP 132/75   Pulse 81   Temp (!) 97.1 F (36.2 C) (Oral)   Ht 5\' 5"  (1.651 m)   Wt 157 lb (71.2 kg)   BMI 26.13 kg/m   Physical Examination:  General: Awake, alert, well nourished, nontoxic but appears uncomfortable. Cardio: regular rate and rhythm, S1S2 heard, no murmurs appreciated Pulm: clear to auscultation bilaterally, no wheezes, rhonchi or rales; normal work of breathing on room air GI: soft, generalized TTP GU: +Suprapubic TTP.   MSK: Mild CVA TTP bilaterally. Back pain predominately in the lumbosacral area.  No results found for this  or any previous visit (from the past 24 hour(s)).   Assessment/ Plan: 56 y.o. female   1. Acute cystitis with hematuria Patient is afebrile and nontoxic-appearing on today's exam.  She does have a past medical history significant for urosepsis in the setting of an left millimeter renal stone in March 2017.  Urinalysis demonstrated 1+ leukocytes, trace blood and ketones.  Urine microscopy showed greater than 30 white blood cells, 3-10 red blood cells mucus threads and few bacteria.  Her 2017 in 2015 urine cultures were reviewed.  2017 revealed E. Coli.  The 2015 urine culture grew Citrobacter.  She had a KUB performed which did show a left-sided opacity near the  left kidney.  However, she also has a lot of stool in this area and it was difficult to discern whether or not this was a true kidney stone versus a fecalith.  Given her history, I do favor that this is likely a kidney stone and for this reason referred her back to her urologist for further intervention.  She was given 1 dose of Rocephin IM here in office.  She was prescribed Keflex 500 mg 4 times daily for the next 7 days.  Nitrofurantoin was considered but this is not a great option for kidney infections.  Cipro and sulfa both interact with Tegretol and Depakote.  Urine has been sent for culture.  She was prescribed Tylenol 3 to use every 6 hours if needed for severe pain.  A CBC and a BMP were obtained today.  Strict return precautions and reasons for emergent evaluation in the emergency department review with patient.  They voiced understanding and will follow-up as needed. - Urinalysis, Complete - DG Abd 1 View; Future - Urine Culture - CBC with Differential - Basic Metabolic Panel - cefTRIAXone (ROCEPHIN) injection 1 g  2. Acute bilateral thoracic back pain Patient was was prescribed Tylenol 3 for back pain.  I did recommend that she completely discontinue tramadol, in the setting of known seizure disorder, as this does reduce the seizure threshold.  She voiced understanding will discontinue this medication. - DG Abd 1 View; Future - Urine Culture - CBC with Differential - Basic Metabolic Panel  3. History of renal stone - DG Abd 1 View; Future   Orders Placed This Encounter  Procedures  . Urine Culture  . DG Abd 1 View    Standing Status:   Future    Number of Occurrences:   1    Standing Expiration Date:   11/11/2018    Order Specific Question:   Reason for Exam (SYMPTOM  OR DIAGNOSIS REQUIRED)    Answer:   history of recurrent renal stones. Patient w/ UTI symptoms and back pain.    Order Specific Question:   Is the patient pregnant?    Answer:   No    Order Specific Question:    Preferred imaging location?    Answer:   Internal  . Urinalysis, Complete  . CBC with Differential  . Basic Metabolic Panel   Meds ordered this encounter  Medications  . cephALEXin (KEFLEX) 500 MG capsule    Sig: Take 1 capsule (500 mg total) by mouth 4 (four) times daily for 7 days.    Dispense:  28 capsule    Refill:  0  . ondansetron (ZOFRAN ODT) 4 MG disintegrating tablet    Sig: Take 1 tablet (4 mg total) by mouth every 8 (eight) hours as needed for nausea or vomiting.    Dispense:  20 tablet    Refill:  0  . cefTRIAXone (ROCEPHIN) injection 1 g  . acetaminophen-codeine (TYLENOL #3) 300-30 MG tablet    Sig: Take 1 tablet by mouth every 6 (six) hours as needed for moderate pain or severe pain.    Dispense:  12 tablet    Refill:  [place], DO [place] [phone]

## 2017-09-12 LAB — BASIC METABOLIC PANEL
BUN / CREAT RATIO: 13 (ref 9–23)
BUN: 7 mg/dL (ref 6–24)
CALCIUM: 8.9 mg/dL (ref 8.7–10.2)
CHLORIDE: 92 mmol/L — AB (ref 96–106)
CO2: 20 mmol/L (ref 20–29)
Creatinine, Ser: 0.54 mg/dL — ABNORMAL LOW (ref 0.57–1.00)
GFR, EST AFRICAN AMERICAN: 122 mL/min/{1.73_m2} (ref >59)
GFR, EST NON AFRICAN AMERICAN: 106 mL/min/{1.73_m2} (ref >59)
Glucose: 99 mg/dL (ref 65–99)
POTASSIUM: 4 mmol/L (ref 3.5–5.2)
Sodium: 128 mmol/L — ABNORMAL LOW (ref 134–144)

## 2017-09-12 LAB — CBC WITH DIFFERENTIAL/PLATELET
BASOS: 0 %
Basophils Absolute: 0 10*3/uL (ref 0.0–0.2)
EOS (ABSOLUTE): 0 10*3/uL (ref 0.0–0.4)
EOS: 0 %
HEMATOCRIT: 38 % (ref 34.0–46.6)
Hemoglobin: 13.7 g/dL (ref 11.1–15.9)
IMMATURE GRANULOCYTES: 0 %
Immature Grans (Abs): 0 10*3/uL (ref 0.0–0.1)
LYMPHS ABS: 1.9 10*3/uL (ref 0.7–3.1)
Lymphs: 16 %
MCH: 33.4 pg — AB (ref 26.6–33.0)
MCHC: 36.1 g/dL — ABNORMAL HIGH (ref 31.5–35.7)
MCV: 93 fL (ref 79–97)
MONOS ABS: 0.9 10*3/uL (ref 0.1–0.9)
Monocytes: 8 %
NEUTROS ABS: 8.8 10*3/uL — AB (ref 1.4–7.0)
NEUTROS PCT: 76 %
PLATELETS: 271 10*3/uL (ref 150–379)
RBC: 4.1 x10E6/uL (ref 3.77–5.28)
RDW: 13.3 % (ref 12.3–15.4)
WBC: 11.7 10*3/uL — ABNORMAL HIGH (ref 3.4–10.8)

## 2017-09-14 LAB — URINE CULTURE

## 2017-09-18 ENCOUNTER — Telehealth: Payer: Self-pay | Admitting: Family Medicine

## 2017-09-18 ENCOUNTER — Other Ambulatory Visit: Payer: Self-pay

## 2017-09-18 MED ORDER — DEPAKOTE 500 MG PO TBEC: 1000.0000 mg | DELAYED_RELEASE_TABLET | Freq: Two times a day (BID) | ORAL | 1 refills | Status: DC

## 2017-09-18 NOTE — Telephone Encounter (Signed)
Have you seen anything on this patient? Please advise and route to the pool

## 2017-09-18 NOTE — Telephone Encounter (Signed)
She had a urine obtained Friday.  The results noted insignificant growth of bacteria.  No antibiotic needed.

## 2017-09-18 NOTE — Telephone Encounter (Signed)
Contacted [facility]. They states she just needs a refill. Refill sent to pharmacy

## 2017-09-19 ENCOUNTER — Telehealth: Payer: Self-pay | Admitting: *Deleted

## 2017-09-19 MED ORDER — DEPAKOTE 500 MG PO TBEC: 1000.0000 mg | DELAYED_RELEASE_TABLET | Freq: Two times a day (BID) | ORAL | 1 refills | Status: DC

## 2017-09-19 NOTE — Telephone Encounter (Signed)
Pt needs a printed Rx for her Depakote to say 'Brand Medically Nec' Rx must be faxed to pharmacy it can not be sent electronically

## 2017-09-19 NOTE — Addendum Note (Signed)
Addended by: Antonietta Barcelona D on: 09/19/2017 10:11 AM   Modules accepted: Orders

## 2017-11-15 ENCOUNTER — Other Ambulatory Visit: Payer: Self-pay | Admitting: Nurse Practitioner

## 2017-11-15 DIAGNOSIS — K219 Gastro-esophageal reflux disease without esophagitis: Secondary | ICD-10-CM

## 2017-11-17 ENCOUNTER — Other Ambulatory Visit: Payer: Self-pay | Admitting: Family Medicine

## 2017-11-17 DIAGNOSIS — G40909 Epilepsy, unspecified, not intractable, without status epilepticus: Secondary | ICD-10-CM

## 2017-11-17 MED ORDER — DEPAKOTE 500 MG PO TBEC: DELAYED_RELEASE_TABLET | ORAL | 0 refills | Status: DC

## 2017-11-17 MED ORDER — CARBAMAZEPINE 200 MG PO TABS: ORAL_TABLET | ORAL | 0 refills | Status: DC

## 2017-11-17 NOTE — Telephone Encounter (Signed)
I think I sent the depakote in this am.  Double check. But yes, ok to refill.  She needs to see me in the next month for discussion of seizures.  She recently switched to me so we've never discussed.  She will need labs at that visit as well.

## 2017-11-17 NOTE — Telephone Encounter (Signed)
Please have patient schedule an appt w/ me to discuss her Seizure disorder, as I have never seen her for this and she is needing refills.  She should be having liver function testing and CBC every 3 months while on this medication.  Refill sent x1 month.

## 2017-11-17 NOTE — Addendum Note (Signed)
Addended by: Dorothey Baseman R on: 11/17/2017 11:56 AM   Modules accepted: Orders

## 2017-11-20 ENCOUNTER — Other Ambulatory Visit: Payer: Self-pay | Admitting: Nurse Practitioner

## 2017-11-20 DIAGNOSIS — G40909 Epilepsy, unspecified, not intractable, without status epilepticus: Secondary | ICD-10-CM

## 2017-11-21 NOTE — Telephone Encounter (Signed)
appt made for 11/27/17

## 2017-11-27 ENCOUNTER — Encounter: Payer: Self-pay | Admitting: Family Medicine

## 2017-11-27 ENCOUNTER — Ambulatory Visit: Payer: Medicaid Other | Admitting: Family Medicine

## 2017-11-27 VITALS — BP 138/81 | HR 78 | Temp 98.0°F | Ht 65.0 in | Wt 153.0 lb

## 2017-11-27 DIAGNOSIS — L570 Actinic keratosis: Secondary | ICD-10-CM | POA: Diagnosis not present

## 2017-11-27 DIAGNOSIS — G40909 Epilepsy, unspecified, not intractable, without status epilepticus: Secondary | ICD-10-CM | POA: Diagnosis not present

## 2017-11-27 DIAGNOSIS — Z808 Family history of malignant neoplasm of other organs or systems: Secondary | ICD-10-CM | POA: Diagnosis not present

## 2017-11-27 DIAGNOSIS — Z5181 Encounter for therapeutic drug level monitoring: Secondary | ICD-10-CM

## 2017-11-27 DIAGNOSIS — E871 Hypo-osmolality and hyponatremia: Secondary | ICD-10-CM

## 2017-11-27 DIAGNOSIS — L821 Other seborrheic keratosis: Secondary | ICD-10-CM | POA: Diagnosis not present

## 2017-11-27 MED ORDER — DEPAKOTE 500 MG PO TBEC: DELAYED_RELEASE_TABLET | ORAL | 3 refills | Status: DC

## 2017-11-27 MED ORDER — CARBAMAZEPINE 200 MG PO TABS: ORAL_TABLET | ORAL | 3 refills | Status: DC

## 2017-11-27 NOTE — Progress Notes (Signed)
Subjective: CC: seizure disorder HPI: Savannah Shaw is a 57 y.o. female presenting to clinic today for:  1. Seizure disorder Patient reports onset of seizure disorder at age 36 or 25.  Last seizure activity over 12 years ago.  She reports that she has tonic-clonic activity during seizure episodes.  She does report intermittent lower extremity twitching and tremor.  She notes that she was actually referred to neurology previously for this for concern of Parkinson's.  However, symptoms were noted to be from medication.  She did not want to risk having seizures again by lowering medications and therefore no changes were made.  Compliant w/ brand name Depakote and Tegretol.  Denies seizure activity, excessive sedation.  She is no longer seeing a neurologist since she has been so well controlled on current dose for so long.  2. Skin lesions Patient reports that she has had several places on her skin that she has been concerned about, particularly under her breast and along her bra line.  She does have one lesion on her left upper extremity which started to appear about 2-1/2 months ago.  She denies spontaneous bleeding, changes in color, texture, size or shape.  She notes a family history significant for unknown skin cancers of both her mother and father.  She is unsure of these are melanoma.  She denies personal history of skin cancer.  She does note significant time in the sun and little use of sunblock.  She has not seen a dermatologist in several years.  ROS: Per HPI  Past Medical History:  Diagnosis Date  . GERD (gastroesophageal reflux disease)   . 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 History of acute pyelonephritis    w/ urosepsis 12-31-2015  resolved  . History of kidney stones   . Hyperlipidemia   . Left ureteral stone   . Normal exercise tolerance test results    12-24-2015 (dr hochrein)  no chest pain, no  signficant arrhythmias , 85% max heartrate achieved   Allergies  Allergen Reactions  . Terbinafine Hcl Rash    Lamasil  . Topamax [Topiramate] Rash    Current Outpatient Medications:  .  acetaminophen-codeine (TYLENOL #3) 300-30 MG tablet, Take 1 tablet by mouth every 6 (six) hours as needed for moderate pain or severe pain., Disp: 12 tablet, Rfl: 0 .  carbamazepine (TEGRETOL) 200 MG tablet, TAKE 1 AND 1/2 TABLETS IN THE MORNING, 1 TABLET AT LUNCH AND 1 AND 1/2 TABLETS IN THE EVENING, Disp: 120 tablet, Rfl: 0 .  DEPAKOTE 500 MG DR tablet, TAKE 2 TABLETS 2 TIMES A DAY, Disp: 120 tablet, Rfl: 0 .  fish oil-omega-3 fatty acids 1000 MG capsule, Take 1 capsule by mouth daily.  , Disp: , Rfl:  .  ondansetron (ZOFRAN ODT) 4 MG disintegrating tablet, Take 1 tablet (4 mg total) by mouth every 8 (eight) hours as needed for nausea or vomiting., Disp: 20 tablet, Rfl: 0 .  pantoprazole (PROTONIX) 40 MG tablet, TAKE 1 TABLET BY MOUTH EVERY DAY, Disp: 90 tablet, Rfl: 0 .  zonisamide (ZONEGRAN) 100 MG capsule, TAKE 1 CAPSULE BY MOUTH EVERY DAY, Disp: 30 capsule, Rfl: 5 Social History   Socioeconomic History  . Marital status: Single    Spouse name: Not on file  . Number of children: Not on file  . Years of education: Not on file  . Highest education level: Not on file  Social Needs  . Financial resource strain: Not on file  . Food insecurity - worry: Not on file  . Food insecurity - inability: Not on file  . Transportation needs - medical: Not on file  . Transportation needs - non-medical: Not on file  Occupational History  . Occupation: Disabled    Comment: Seizures  Tobacco Use  . Smoking status: Former Smoker    Packs/day: 1.00    Years: 5.00    Pack years: 5.00    Types: Cigarettes    Last attempt to quit: 01/11/2011    Years since quitting: 6.8  . Smokeless tobacco: Never Used  Substance and Sexual Activity  . Alcohol use: No  . Drug use: No  . Sexual activity: Not on file  Other  Topics Concern  . Not on file  Social History Narrative   Daily Caffeine use - 3   Family History  Problem Relation Age of Onset  . Diabetes Mother   . Heart disease Mother 38       No details about the type of heart disease.    . Prostate cancer Other   . Colon cancer Neg Hx     Objective: Office vital signs reviewed. BP 138/81   Pulse 78   Temp 98 F (36.7 C) (Oral)   Ht '5\' 5"'  (1.651 m)   Wt 153 lb (69.4 kg)   BMI 25.46 kg/m   Physical Examination:  General: Awake, alert, well nourished, well appearing female, No acute distress HEENT: sclera white, MMM Cardio: regular rate and rhythm, S1S2 heard, no murmurs appreciated Pulm: clear to auscultation bilaterally, no wheezes, rhonchi or rales; normal work of breathing on room air MSK: normal gait and normal station Skin: dry; intact; Fitzpatrick 1-2.  She has several benign-appearing nevi.  Underneath her bra line there are several small seborrheic keratosis.  There is a 3.5 mm x 3.5 mm seborrheic keratosis on the left flank.  There is a 3 mm x 3 mm flaking, keratotic lesion on the left upper bicep.  This is nonbleeding, nonexudative, not erythematous, not pigmented. Neuro: Memory subjectively impaired.  Alert and oriented x3.  Follows all commands.  No focal neurologic deficits.  Cryotherapy Procedure:  Risks and benefits of procedure were reviewed with the patient.  Written consent obtained and scanned into the chart.  Lesion of concern was identified and located on Left bicep.  Liquid nitrogen was applied to area of concern and extending out 1.5 millimeters beyond the border of the lesion.  Treated area was allowed to come back to room temperature before treating it a second time.  Patient tolerated procedure well and there were no immediate complications.  Home care instructions were reviewed with the patient and a handout was provided.   Assessment/ Plan: 57 y.o. female   Seizure disorder Well-controlled on Tegretol and  Depakote.  I have refilled brand-name medications times 4 months.  I did discuss with patient that we should be checking her CMP and CBC at least every 3 months.  These have been ordered today and I will contact her with results once they are available.  Of note, her hyponatremia was appreciated her November 2018 lab work.  This may have been related to concern for renal stone versus lowered sodium levels being related to either her Depakote or Tegretol.  If persistent, will consider referral to neurology for medication adjustment consideration.  Medication monitoring encounter - CMP14+EGFR - CBC with Differential  Hyponatremia See above. - CMP14+EGFR  Actinic keratosis Treated with cryotherapy.  Patient tolerated this well.  Home care instructions were reviewed with the patient and she voiced good understanding. - Ambulatory referral to Dermatology  Seborrheic keratoses - Ambulatory referral to Dermatology  Family history of skin cancer Uncertain family history.  She is a Fitzpatrick 1-2 and has had significant sun exposure in the past.  Patient would most definitely benefit from full body exam given her multiple nevi/skin lesions. - Ambulatory referral to Dermatology  Janora Norlander, [place] [phone]

## 2017-11-27 NOTE — Patient Instructions (Signed)
I placed a referral to dermatology for a full skin evaluation.  Seborrheic Keratosis Seborrheic keratosis is a common, noncancerous (benign) skin growth. This condition causes waxy, rough, tan, brown, or black spots to appear on the skin. These skin growths can be flat or raised. What are the causes? The cause of this condition is not known. What increases the risk? This condition is more likely to develop in:  People who have a family history of seborrheic keratosis.  People who are 89 or older.  People who are pregnant.  People who have had estrogen replacement therapy.  What are the signs or symptoms? This condition often occurs on the face, chest, shoulders, back, or other areas. These growths:  Are usually painless, but may become irritated and itchy.  Can be yellow, brown, black, or other colors.  Are slightly raised or have a flat surface.  Are sometimes rough or wart-like in texture.  Are often waxy on the surface.  Are round or oval-shaped.  Sometimes look like they are "stuck on."  Often occur in groups, but may occur as a single growth.  How is this diagnosed? This condition is diagnosed with a medical history and physical exam. A sample of the growth may be tested (skin biopsy). You may need to see a skin specialist (dermatologist). How is this treated? Treatment is not usually needed for this condition, unless the growths are irritated or are often bleeding. You may also choose to have the growths removed if you do not like their appearance. Most commonly, these growths are treated with a procedure in which liquid nitrogen is applied to "freeze" off the growth (cryosurgery). They may also be burned off with electricity or cut off. Follow these instructions at home:  Watch your growth for any changes.  Keep all follow-up visits as told by your health care provider. This is important.  Do not scratch or pick at the growth or growths. This can cause them to  become irritated or infected. Contact a health care provider if:  You suddenly have many new growths.  Your growth bleeds, itches, or hurts.  Your growth suddenly becomes larger or changes color. This information is not intended to replace advice given to you by your health care provider. Make sure you discuss any questions you have with your health care provider. Document Released: 11/05/2010 Document Revised: 03/10/2016 Document Reviewed: 02/18/2015 Elsevier Interactive Patient Education  2018 [employer].  Actinic Keratosis An actinic keratosis is a precancerous growth on the skin. This means that it could develop into skin cancer if it is not treated. About 1% of these growths (actinic keratoses) turn into skin cancer within one year if they are not treated. It is important to have all of these growths evaluated to determine the best treatment approach. What are the causes? This condition is caused by getting too much ultraviolet (UV) radiation from the sun or other UV light sources. What increases the risk? The following factors may make you more likely to develop this condition:  Having light-colored skin and blue eyes.  Having blonde or red hair.  Spending a lot of time in the sun.  Inadequate skin protection when outdoors. This may include: ? Not using sunscreen properly. ? Not covering up skin that is exposed to sunlight.  Aging. The risk of developing an actinic keratosis increases with age.  What are the signs or symptoms? Actinic keratoses look like scaly, rough spots of skin.They can be as small as a pinhead or  as big as a quarter. They may itch, hurt, or feel sensitive. In most cases, the growths become red. In some cases, they may be skin-colored, light tan, dark tan, pink, or a combination of any of these colors. There may be a small piece of pink or gray skin (skin tag) growing from the actinic keratosis. In some cases, it may be easier to notice actinic keratoses  by feeling them, rather than seeing them. Actinic keratoses appear most often on areas of skin that get a lot of sun exposure, including the scalp, face, ears, lips, upper back, forearms, and the backs of the hands. Sometimes, actinic keratoses disappear, but many reappear a few days to a few weeks later. How is this diagnosed? This condition is usually diagnosed with a physical exam. A tissue sample may be removed from the actinic keratosis and examined under a microscope (biopsy). How is this treated?  Treatment for this condition may include:  Scraping off the actinic keratosis (curettage).  Freezing the actinic keratosis with liquid nitrogen (cryosurgery). This causes the growth to eventually fall off the skin.  Applying medicated creams or gels to destroy the cells in the growth.  Applying chemicals to the actinic keratosis to make the outer layers of skin peel off (chemical peel).  Photodynamic therapy. In this procedure, medicated cream is applied to the actinic keratosis. This cream increases your skin's sensitivity to light. Then, a strong light is aimed at the actinic keratosis to destroy cells in the growth.  Follow these instructions at home: Skin care  Apply cool, wet cloths (cool compresses) to the affected areas.  Do not scratch your skin.  Check your skin regularly for any growths, especially growths that: ? Start to itch or bleed. ? Change in size, shape, or color. Caring for the treated area  Keep the treated area clean and dry as told by your health care provider.  Do not apply any medicine, cream, or lotion to the treated area unless your health care provider tells you to do that.  Do not pick at blisters or try to break them open. This can cause infection and scarring.  If you have red or irritated skin after treatment, follow instructions from your health care provider about how to take care of the treated area. Make sure you: ? Wash your hands with soap and  water before you change your bandage (dressing). If soap and water are not available, use hand sanitizer. ? Change your dressing as told by your health care provider.  If you have red or irritated skin after treatment, check your treated area every day for signs of infection. Check for: ? Swelling, pain, or more redness. ? Fluid or blood. ? Warmth. ? Pus or a bad smell. General instructions  Take over-the-counter and prescription medicines only as told by your health care provider.  Return to your normal activities as told by your health care provider. Ask your health care provider what activities are safe for you.  Do not use any tobacco products, such as cigarettes, chewing tobacco, and e-cigarettes. If you need help quitting, ask your health care provider.  Have a skin exam done every year by a health care provider who is a skin conditions specialist (dermatologist).  Keep all follow-up visits as told by your health care provider. This is important. How is this prevented?  Do not get sunburns.  Try to avoid the sun between 10:00 a.m. and 4:00 p.m. This is when the UV light is the  strongest.  Use a sunscreen or sunblock with SPF 30 (sun protection factor 30) or greater.  Apply sunscreen before you are exposed to sunlight, and reapply periodically as often as directed by the instructions on the sunscreen container.  Always wear sunglasses that have UV protection, and always wear hats and clothing to protect your skin from sunlight.  When possible, avoid medicines that increase your sensitivity to sunlight. These include: ? Certain antibiotic medicines. ? Certain water pills (diuretics). ? Certain prescription medicines that are used to treat acne (retinoids).  Do not use tanning beds or other indoor tanning devices. Contact a health care provider if:  You notice any changes or new growths on your skin.  You have swelling, pain, or more redness around your treated  area.  You have fluid or blood coming from your treated area.  Your treated area feels warm to the touch.  You have pus or a bad smell coming from your treated area.  You have a fever.  You have a blister that becomes large and painful. This information is not intended to replace advice given to you by your health care provider. Make sure you discuss any questions you have with your health care provider. Document Released: 12/30/2008 Document Revised: 06/03/2016 Document Reviewed: 06/13/2015 Elsevier Interactive Patient Education  [employer].

## 2017-11-27 NOTE — Assessment & Plan Note (Signed)
Well-controlled on Tegretol and Depakote.  I have refilled brand-name medications times 4 months.  I did discuss with patient that we should be checking her CMP and CBC at least every 3 months.  These have been ordered today and I will contact her with results once they are available.  Of note, her hyponatremia was appreciated her November 2018 lab work.  This may have been related to concern for renal stone versus lowered sodium levels being related to either her Depakote or Tegretol.  If persistent, will consider referral to neurology for medication adjustment consideration.

## 2017-11-28 LAB — CBC WITH DIFFERENTIAL/PLATELET
BASOS: 1 %
Basophils Absolute: 0 10*3/uL (ref 0.0–0.2)
EOS (ABSOLUTE): 0 10*3/uL (ref 0.0–0.4)
EOS: 1 %
HEMATOCRIT: 37.3 % (ref 34.0–46.6)
Hemoglobin: 12.6 g/dL (ref 11.1–15.9)
IMMATURE GRANS (ABS): 0 10*3/uL (ref 0.0–0.1)
Immature Granulocytes: 0 %
LYMPHS: 40 %
Lymphocytes Absolute: 1.6 10*3/uL (ref 0.7–3.1)
MCH: 33 pg (ref 26.6–33.0)
MCHC: 33.8 g/dL (ref 31.5–35.7)
MCV: 98 fL — AB (ref 79–97)
Monocytes Absolute: 0.5 10*3/uL (ref 0.1–0.9)
Monocytes: 12 %
Neutrophils Absolute: 1.9 10*3/uL (ref 1.4–7.0)
Neutrophils: 46 %
Platelets: 268 10*3/uL (ref 150–379)
RBC: 3.82 x10E6/uL (ref 3.77–5.28)
RDW: 13.6 % (ref 12.3–15.4)
WBC: 4 10*3/uL (ref 3.4–10.8)

## 2017-11-28 LAB — CMP14+EGFR
ALT: 11 IU/L (ref 0–32)
AST: 19 IU/L (ref 0–40)
Albumin/Globulin Ratio: 1.8 (ref 1.2–2.2)
Albumin: 4.3 g/dL (ref 3.5–5.5)
Alkaline Phosphatase: 65 IU/L (ref 39–117)
BUN/Creatinine Ratio: 17 (ref 9–23)
BUN: 9 mg/dL (ref 6–24)
Bilirubin Total: <0.2 mg/dL (ref 0.0–1.2)
CALCIUM: 8.9 mg/dL (ref 8.7–10.2)
CO2: 21 mmol/L (ref 20–29)
CREATININE: 0.54 mg/dL — AB (ref 0.57–1.00)
Chloride: 99 mmol/L (ref 96–106)
GFR, EST AFRICAN AMERICAN: 122 mL/min/{1.73_m2} (ref >59)
GFR, EST NON AFRICAN AMERICAN: 106 mL/min/{1.73_m2} (ref >59)
GLOBULIN, TOTAL: 2.4 g/dL (ref 1.5–4.5)
Glucose: 97 mg/dL (ref 65–99)
Potassium: 4.6 mmol/L (ref 3.5–5.2)
SODIUM: 136 mmol/L (ref 134–144)
TOTAL PROTEIN: 6.7 g/dL (ref 6.0–8.5)

## 2018-01-09 IMAGING — CT CT ABD-PELV W/ CM
2 of 5 series · 16 of 46 positions shown, 18 images · IV contrast (Omnipaque 300)
Comparison: 07/20/2009

CLINICAL DATA: Left-sided inguinal pain since this morning.

EXAM:
CT ABDOMEN AND PELVIS WITH CONTRAST
TECHNIQUE: Multidetector CT imaging of the abdomen and pelvis was performed
using the standard protocol following bolus administration of
intravenous contrast.
CONTRAST:  100mL OMNIPAQUE IOHEXOL 300 MG/ML  SOLN

[Series 2: abd_pel_with 5.0 b40f · axial · 0.70mm/px · z∈[-388,+16]mm · 13 of 93 slices shown, 15 images]
[im 6/93  soft-tissue]
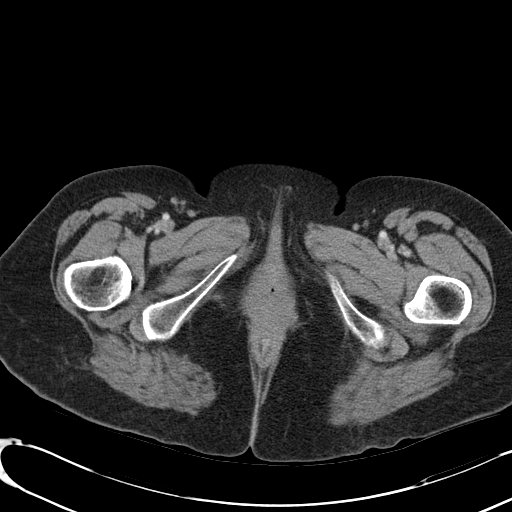
[im 6/93  bone]
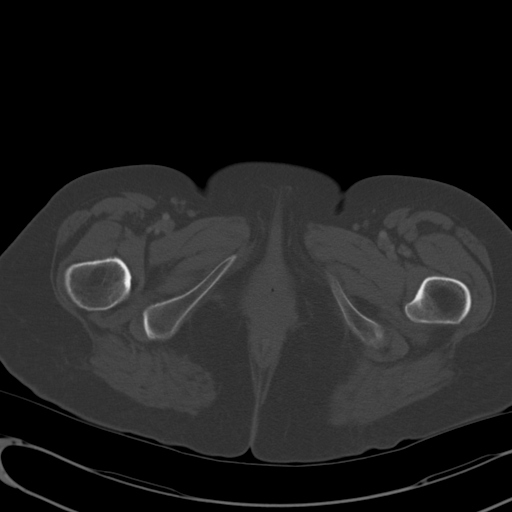
[im 11/93  soft-tissue]
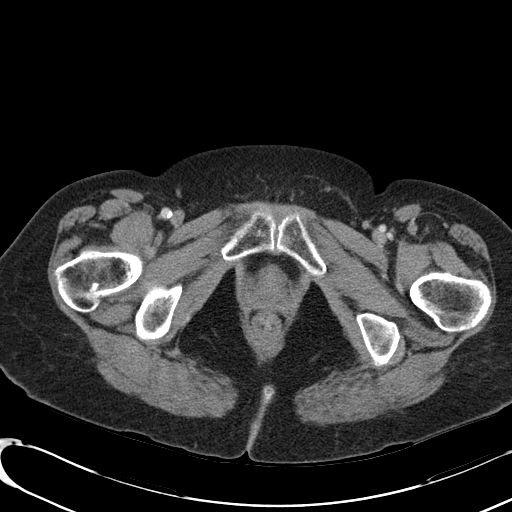
[im 21/93  soft-tissue]
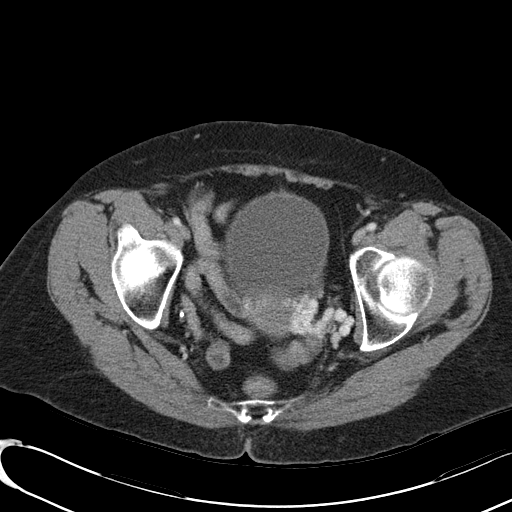
[im 26/93  soft-tissue]
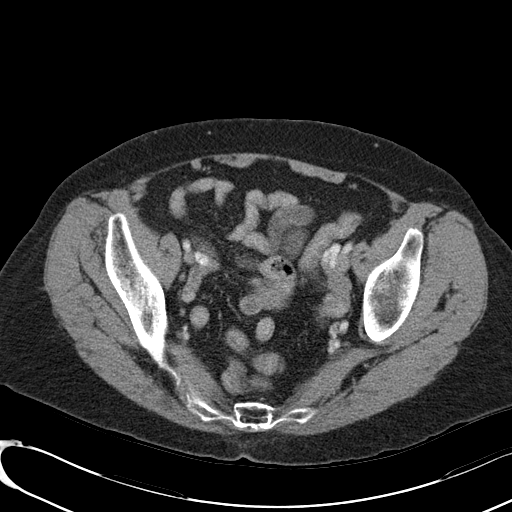
[im 31/93  soft-tissue]
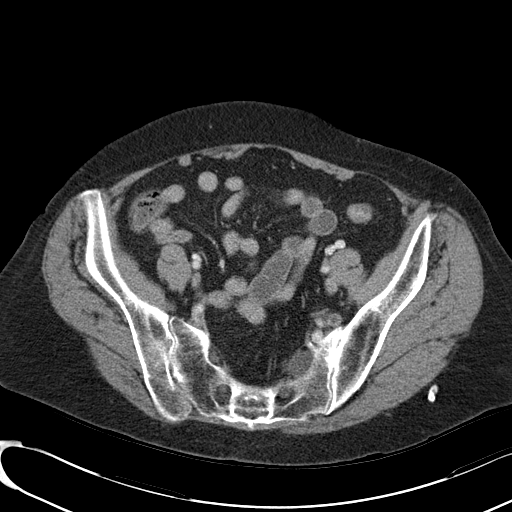
[im 41/93  soft-tissue]
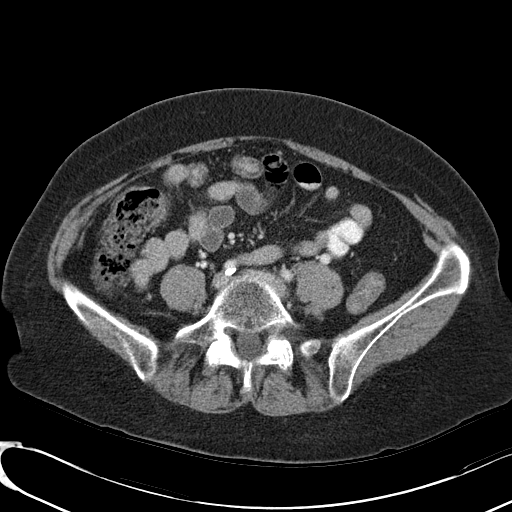
[im 47/93  soft-tissue]
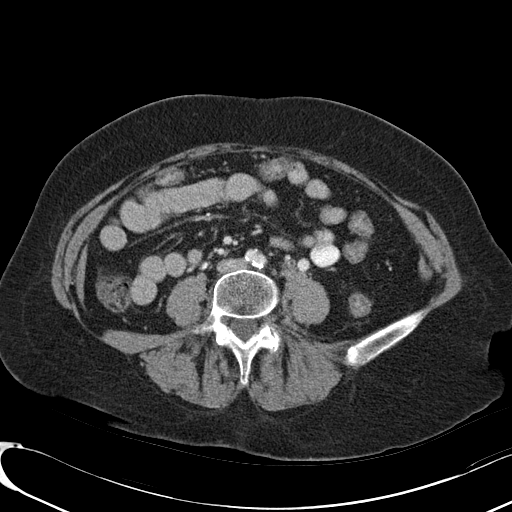
[im 52/93  soft-tissue]
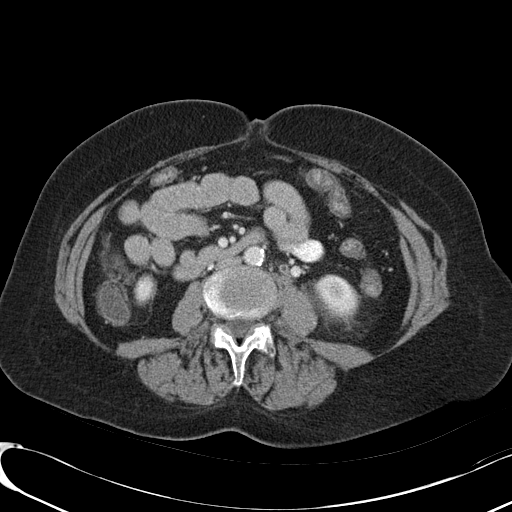
[im 62/93  soft-tissue]
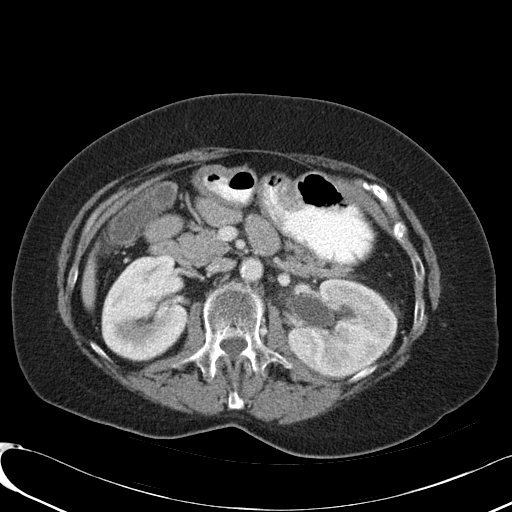
[im 62/93  bone]
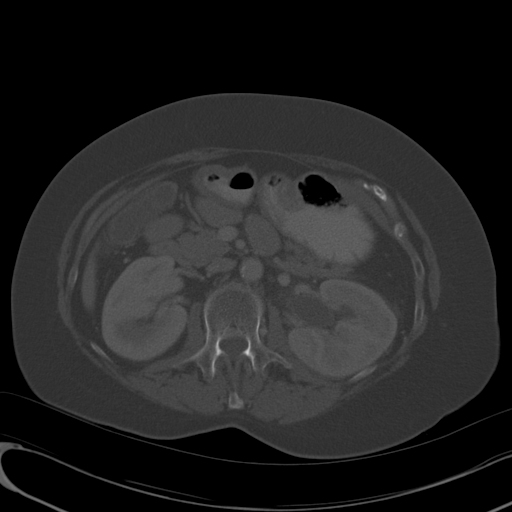
[im 67/93  soft-tissue]
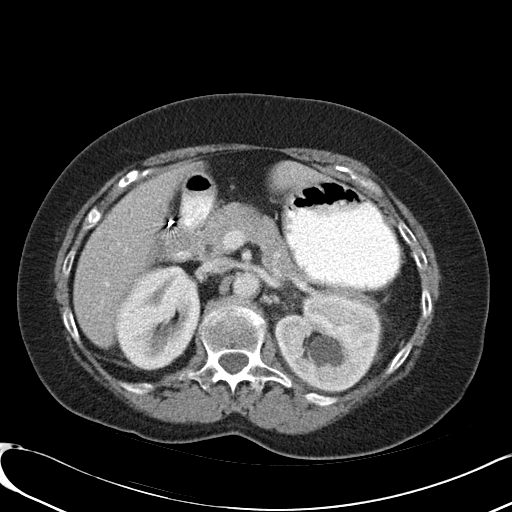
[im 72/93  soft-tissue]
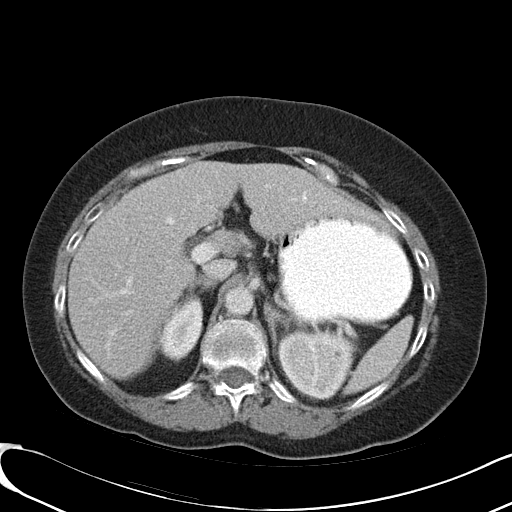
[im 82/93  soft-tissue]
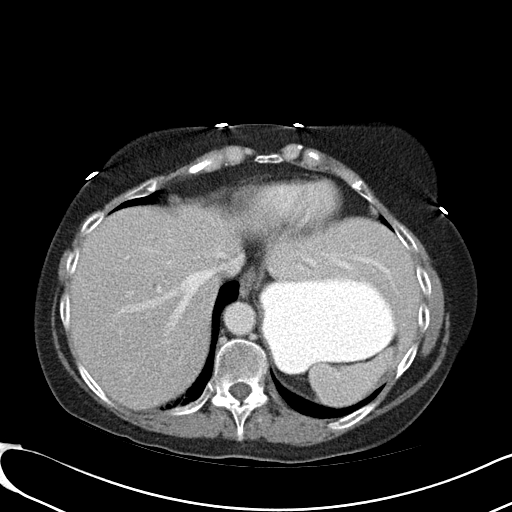
[im 87/93  soft-tissue]
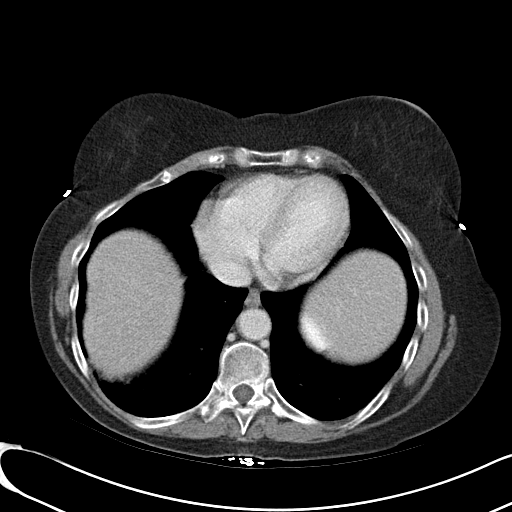

[Series 4: abd_pel_with 3.0 spo cor · coronal · 0.71mm/px · 3 of 87 slices shown]
[im 29/87  soft-tissue]
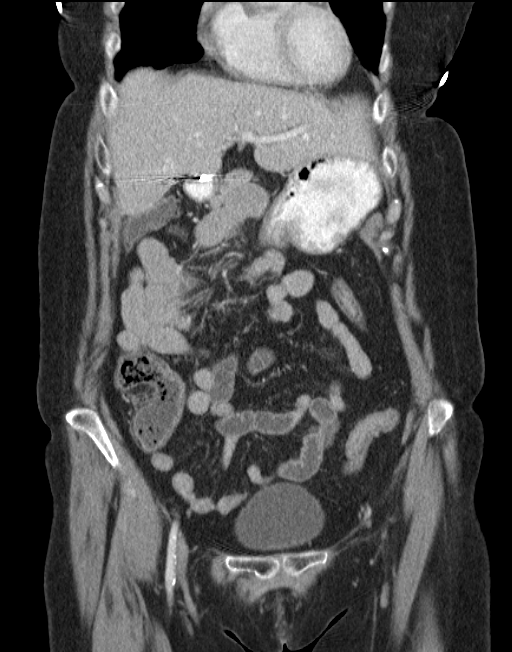
[im 39/87  soft-tissue]
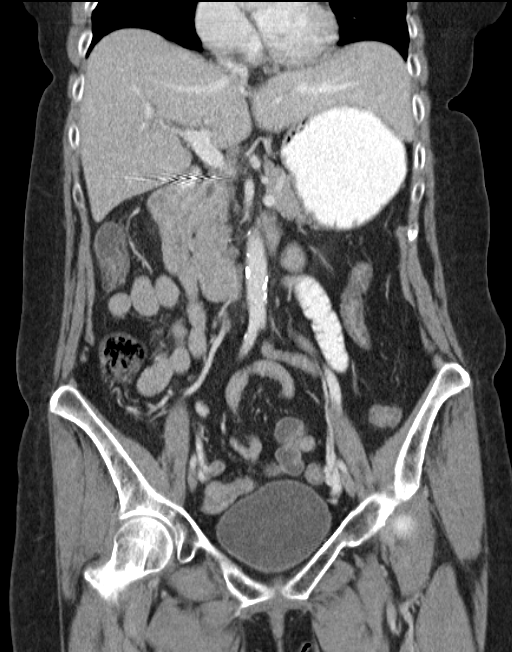
[im 48/87  soft-tissue]
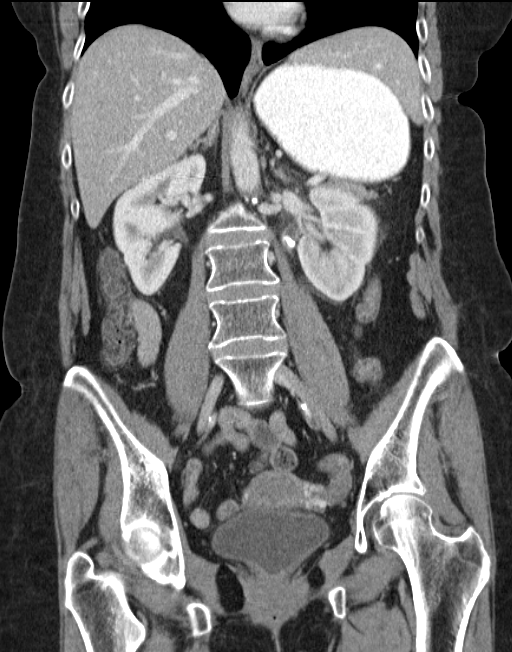

[16 of 46 positions shown; findings below may reference images not displayed]

FINDINGS: 11 mm calculus is present at the left ureteropelvic junction. There
is associated hydronephrosis and delayed excretion from the left
kidney. Moderate left perinephric stranding. No evidence of right
renal calculus or right hydronephrosis. There is enhancement of the
wall of the left ureter below the area of obstruction. This
indicates inflammation, but not necessarily infection.

Contrast fills and refluxes into the left ovarian vein the leading
to left-sided pelvic varices. There is probably also reflux within
the right ovarian vein, to a lesser degree.

Postcholecystectomy.

Liver, spleen, pancreas, and adrenal glands are within normal
limits. There is stable appearance.

There is no free fluid. There is no abnormal retroperitoneal
adenopathy.

Bladder is within normal limits. Uterus is within normal limits.
Ovaries are unremarkable.

No vertebral compression deformity.
IMPRESSION: 11 mm left ureteropelvic junction calculus is associated with
secondary findings of left ureteral obstruction.

Reflux in the bilateral ovarian veins leads to pelvic varices.

Postcholecystectomy.

## 2018-02-17 ENCOUNTER — Other Ambulatory Visit: Payer: Self-pay | Admitting: Family Medicine

## 2018-02-17 DIAGNOSIS — K219 Gastro-esophageal reflux disease without esophagitis: Secondary | ICD-10-CM

## 2018-03-05 DIAGNOSIS — H40033 Anatomical narrow angle, bilateral: Secondary | ICD-10-CM | POA: Diagnosis not present

## 2018-03-05 DIAGNOSIS — H1013 Acute atopic conjunctivitis, bilateral: Secondary | ICD-10-CM | POA: Diagnosis not present

## 2018-03-19 ENCOUNTER — Other Ambulatory Visit: Payer: Self-pay | Admitting: Family Medicine

## 2018-03-19 NOTE — Telephone Encounter (Signed)
Please have patient schedule appt for labs/ seizure disorder.  Need to check CMP/ CBC

## 2018-03-21 ENCOUNTER — Ambulatory Visit: Payer: Medicaid Other | Admitting: Family Medicine

## 2018-04-06 NOTE — Progress Notes (Signed)
Subjective: CC: seizure disorder HPI: Savannah Shaw is a 57 y.o. female presenting to clinic today for:  1. Seizure disorder/ anxiety History:  Onset of seizure disorder at age 34 or 76.  Last seizure activity over 12 years ago.  She has tonic-clonic activity during seizure episodes.  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  She does not want to risk having seizures again by lowering medications and therefore no changes were made. No longer sees neuro because she has been controlled on current medication regimen.   Patient was seen last in February 2019.  She reports compliance w/ brand name Depakote, Tegretol and Zonegran.  Denies seizure activity, excessive sedation.  However, she does note occasional twitching in the lower extremity's.  She worries that this will progress to a full-blown seizure.  She is unsure if this is related to increased stress and anxiety which she has been experiencing since allowing her grandson to live reside with her.  She notes that he is causing quite a bit of unrest within the household, often upsetting her mother.  She reports increased anxiety symptoms related to this.  She was treated for anxiety in the past but notes that she cannot recall the name of the medication.  She thinks it was an SSRI.  She is wondering if there is something we can do to help offset stress symptoms.  She is planning on taking amount soon.  She also notes the diagnosis of her aunts recently with breast cancer.  She is undergoing chemotherapy for this currently.  She also notes that her ex-husband was recently diagnosed with pancreatic cancer, she thinks stage III.  2.  Acid reflux Patient reports good control of acid reflux with Protonix.  Denies any nausea, vomiting, abdominal pain.  ROS: Per HPI  Past Medical History:  Diagnosis Date  . GERD (gastroesophageal reflux  disease)   . 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 History of acute pyelonephritis    w/ urosepsis 12-31-2015  resolved  . History of kidney stones   . Hyperlipidemia   . Left ureteral stone   . Normal exercise tolerance test results    12-24-2015 (dr hochrein)  no chest pain, no signficant arrhythmias , 85% max heartrate achieved   Allergies  Allergen Reactions  . Terbinafine Hcl Rash    Lamasil  . Topamax [Topiramate] Rash    Current Outpatient Medications:  .  acetaminophen-codeine (TYLENOL #3) 300-30 MG tablet, Take 1 tablet by mouth every 6 (six) hours as needed for moderate pain or severe pain., Disp: 12 tablet, Rfl: 0 .  carbamazepine (TEGRETOL) 200 MG tablet, TAKE 1 AND 1/2 TABLETS IN THE MORNING, 1 TABLET AT LUNCH AND 1 AND 1/2 TABLETS IN THE EVENING, Disp: 120 tablet, Rfl: 3 .  DEPAKOTE 500 MG DR tablet, TAKE 2 TABLETS BY MOUTH 2 TIMES A DAY, Disp: 120 tablet, Rfl: 0 .  fish oil-omega-3 fatty acids 1000 MG capsule, Take 1 capsule by mouth daily.  , Disp: , Rfl:  .  ondansetron (ZOFRAN ODT) 4 MG disintegrating tablet, Take 1 tablet (4 mg total) by mouth every 8 (eight) hours as needed for nausea or vomiting., Disp: 20 tablet, Rfl: 0 .  pantoprazole (PROTONIX) 40 MG tablet, TAKE 1 TABLET BY MOUTH EVERY DAY, Disp: 90 tablet, Rfl: 0 .  zonisamide (ZONEGRAN) 100  MG capsule, TAKE 1 CAPSULE BY MOUTH EVERY DAY, Disp: 30 capsule, Rfl: 5 Social History   Socioeconomic History  . Marital status: Single    Spouse name: Not on file  . Number of children: Not on file  . Years of education: Not on file  . Highest education level: Not on file  Occupational History  . Occupation: Disabled    Comment: Seizures  Social Needs  . Financial resource strain: Not on file  . Food insecurity:    Worry: Not on file    Inability: Not on file  . Transportation needs:    Medical: Not on file    Non-medical: Not on  file  Tobacco Use  . Smoking status: Former Smoker    Packs/day: 1.00    Years: 5.00    Pack years: 5.00    Types: Cigarettes    Last attempt to quit: 01/11/2011    Years since quitting: 7.2  . Smokeless tobacco: Never Used  Substance and Sexual Activity  . Alcohol use: No  . Drug use: No  . Sexual activity: Not on file  Lifestyle  . Physical activity:    Days per week: Not on file    Minutes per session: Not on file  . Stress: Not on file  Relationships  . Social connections:    Talks on phone: Not on file    Gets together: Not on file    Attends religious service: Not on file    Active member of club or organization: Not on file    Attends meetings of clubs or organizations: Not on file    Relationship status: Not on file  . Intimate partner violence:    Fear of current or ex partner: Not on file    Emotionally abused: Not on file    Physically abused: Not on file    Forced sexual activity: Not on file  Other Topics Concern  . Not on file  Social History Narrative   Daily Caffeine use - 3   Family History  Problem Relation Age of Onset  . Diabetes Mother   . Heart disease Mother 41       No details about the type of heart disease.    . Prostate cancer Other   . Colon cancer Neg Hx     Objective: Office vital signs reviewed. BP 122/75   Pulse 66   Temp (!) 97.5 F (36.4 C) (Oral)   Ht '5\' 5"'  (1.651 m)   Wt 150 lb (68 kg)   BMI 24.96 kg/m   Physical Examination:  General: Awake, alert, well appearing female, No acute distress HEENT: sclera white, moist mucous membranes, PERRLA, EOMI Cardio: regular rate and rhythm, S1S2 heard, no murmurs appreciated Pulm: clear to auscultation bilaterally, no wheezes, rhonchi or rales; normal work of breathing on room air MSK: normal gait and normal station Extremities: Warm, well-perfused, no edema. Neuro: Alert and oriented x3.  Follows all commands.  No focal neurologic deficits. Psych: Intermittently tearful.  Affect  appropriate, speech normal, good eye contact, pleasant.  Depression screen [facility] 2/9 04/11/2018 11/27/2017 09/11/2017  Decreased Interest 2 0 0  Down, Depressed, Hopeless 1 2 0  PHQ - 2 Score 3 2 0  Altered sleeping 2 1 -  Tired, decreased energy 1 1 -  Change in appetite 2 1 -  Feeling bad or failure about yourself  2 1 -  Trouble concentrating 0 0 -  Moving slowly or fidgety/restless 0 0 -  Suicidal thoughts 0 0 -  PHQ-9 Score 10 6 -   Assessment/ Plan: 57 y.o. female   1. Seizure disorder ([place]) Refills of her medications have been provided.  Check CMP and CBC. - CMP14+EGFR - CBC with Differential - zonisamide (ZONEGRAN) 100 MG capsule; TAKE 1 CAPSULE BY MOUTH EVERY DAY  Dispense: 30 capsule; Refill: 3  2. Gastroesophageal reflux disease, esophagitis presence not specified - pantoprazole (PROTONIX) 40 MG tablet; Take 1 tablet (40 mg total) by mouth daily.  Dispense: 90 tablet; Refill: 1  3. Stress reaction PHQ 9 score of 10 today.  I suspect that she is having acute stress reaction related to the situation at home and family members which have had new diagnosis of cancer.  We discussed options including SSRI, Atarax and benzodiazepines.  I think given that this is situational Atarax would be appropriate.  If she finds a persistent need for the medication, we could consider starting an SSRI going forward.  She will keep me informed of this.  She will follow-up in 3 months or sooner if needed.   Total time spent with patient 27 minutes.  Greater than 50% of encounter spent in coordination of care/counseling.   Janora Norlander, DO [place] [phone]

## 2018-04-11 ENCOUNTER — Encounter: Payer: Self-pay | Admitting: Family Medicine

## 2018-04-11 ENCOUNTER — Ambulatory Visit: Payer: Medicaid Other | Admitting: Family Medicine

## 2018-04-11 VITALS — BP 122/75 | HR 66 | Temp 97.5°F | Ht 65.0 in | Wt 150.0 lb

## 2018-04-11 DIAGNOSIS — F430 Acute stress reaction: Secondary | ICD-10-CM | POA: Diagnosis not present

## 2018-04-11 DIAGNOSIS — G40909 Epilepsy, unspecified, not intractable, without status epilepticus: Secondary | ICD-10-CM

## 2018-04-11 DIAGNOSIS — K219 Gastro-esophageal reflux disease without esophagitis: Secondary | ICD-10-CM | POA: Diagnosis not present

## 2018-04-11 LAB — CBC WITH DIFFERENTIAL/PLATELET
Basophils Absolute: 0.1 10*3/uL (ref 0.0–0.2)
Basos: 1 %
EOS (ABSOLUTE): 0 10*3/uL (ref 0.0–0.4)
EOS: 1 %
HEMATOCRIT: 37.8 % (ref 34.0–46.6)
HEMOGLOBIN: 12.6 g/dL (ref 11.1–15.9)
Immature Grans (Abs): 0 10*3/uL (ref 0.0–0.1)
Immature Granulocytes: 0 %
LYMPHS ABS: 1.8 10*3/uL (ref 0.7–3.1)
Lymphs: 49 %
MCH: 32.4 pg (ref 26.6–33.0)
MCHC: 33.3 g/dL (ref 31.5–35.7)
MCV: 97 fL (ref 79–97)
MONOCYTES: 11 %
Monocytes Absolute: 0.4 10*3/uL (ref 0.1–0.9)
NEUTROS ABS: 1.5 10*3/uL (ref 1.4–7.0)
Neutrophils: 38 %
Platelets: 274 10*3/uL (ref 150–450)
RBC: 3.89 x10E6/uL (ref 3.77–5.28)
RDW: 13.5 % (ref 12.3–15.4)
WBC: 3.8 10*3/uL (ref 3.4–10.8)

## 2018-04-11 LAB — CMP14+EGFR
ALT: 13 IU/L (ref 0–32)
AST: 20 IU/L (ref 0–40)
Albumin/Globulin Ratio: 1.8 (ref 1.2–2.2)
Albumin: 4.3 g/dL (ref 3.5–5.5)
Alkaline Phosphatase: 62 IU/L (ref 39–117)
BUN/Creatinine Ratio: 13 (ref 9–23)
BUN: 7 mg/dL (ref 6–24)
Bilirubin Total: <0.2 mg/dL (ref 0.0–1.2)
CO2: 22 mmol/L (ref 20–29)
CREATININE: 0.53 mg/dL — AB (ref 0.57–1.00)
Calcium: 9 mg/dL (ref 8.7–10.2)
Chloride: 100 mmol/L (ref 96–106)
GFR calc non Af Amer: 106 mL/min/{1.73_m2} (ref >59)
GFR, EST AFRICAN AMERICAN: 122 mL/min/{1.73_m2} (ref >59)
GLOBULIN, TOTAL: 2.4 g/dL (ref 1.5–4.5)
GLUCOSE: 83 mg/dL (ref 65–99)
Potassium: 4.8 mmol/L (ref 3.5–5.2)
SODIUM: 134 mmol/L (ref 134–144)
TOTAL PROTEIN: 6.7 g/dL (ref 6.0–8.5)

## 2018-04-11 MED ORDER — DEPAKOTE 500 MG PO TBEC: DELAYED_RELEASE_TABLET | ORAL | 3 refills | Status: DC

## 2018-04-11 MED ORDER — HYDROXYZINE HCL 25 MG PO TABS: 25.0000 mg | ORAL_TABLET | Freq: Three times a day (TID) | ORAL | 0 refills | Status: DC | PRN

## 2018-04-11 MED ORDER — PANTOPRAZOLE SODIUM 40 MG PO TBEC: 40.0000 mg | DELAYED_RELEASE_TABLET | Freq: Every day | ORAL | 1 refills | Status: DC

## 2018-04-11 MED ORDER — TEGRETOL 200 MG PO TABS: ORAL_TABLET | ORAL | 3 refills | Status: DC

## 2018-04-11 MED ORDER — ZONISAMIDE 100 MG PO CAPS: ORAL_CAPSULE | ORAL | 3 refills | Status: DC

## 2018-04-11 NOTE — Patient Instructions (Addendum)
Let's make our next visit your annual exam w/ fasting labs.  We will check your cholesterol panel that day.  We will also update your pap smear.  See me in 3 months or sooner if needed.  You had labs performed today.  You will be contacted with the results of the labs once they are available, usually in the next 3 business days for routine lab work.   Stress and Stress Management Stress is a normal reaction to life events. It is what you feel when life demands more than you are used to or more than you can handle. Some stress can be useful. For example, the stress reaction can help you catch the last bus of the day, study for a test, or meet a deadline at work. But stress that occurs too often or for too long can cause problems. It can affect your emotional health and interfere with relationships and normal daily activities. Too much stress can weaken your immune system and increase your risk for physical illness. If you already have a medical problem, stress can make it worse. What are the causes? All sorts of life events may cause stress. An event that causes stress for one person may not be stressful for another person. Major life events commonly cause stress. These may be positive or negative. Examples include losing your job, moving into a new home, getting married, having a baby, or losing a loved one. Less obvious life events may also cause stress, especially if they occur day after day or in combination. Examples include working long hours, driving in traffic, caring for children, being in debt, or being in a difficult relationship. What are the signs or symptoms? Stress may cause emotional symptoms including, the following:  Anxiety. This is feeling worried, afraid, on edge, overwhelmed, or out of control.  Anger. This is feeling irritated or impatient.  Depression. This is feeling sad, down, helpless, or guilty.  Difficulty focusing, remembering, or making decisions.  Stress may cause  physical symptoms, including the following:  Aches and pains. These may affect your head, neck, back, stomach, or other areas of your body.  Tight muscles or clenched jaw.  Low energy or trouble sleeping.  Stress may cause unhealthy behaviors, including the following:  Eating to feel better (overeating) or skipping meals.  Sleeping too little, too much, or both.  Working too much or putting off tasks (procrastination).  Smoking, drinking alcohol, or using drugs to feel better.  How is this diagnosed? Stress is diagnosed through an assessment by your health care provider. Your health care provider will ask questions about your symptoms and any stressful life events.Your health care provider will also ask about your medical history and may order blood tests or other tests. Certain medical conditions and medicine can cause physical symptoms similar to stress. Mental illness can cause emotional symptoms and unhealthy behaviors similar to stress. Your health care provider may refer you to a mental health professional for further evaluation. How is this treated? Stress management is the recommended treatment for stress.The goals of stress management are reducing stressful life events and coping with stress in healthy ways. Techniques for reducing stressful life events include the following:  Stress identification. Self-monitor for stress and identify what causes stress for you. These skills may help you to avoid some stressful events.  Time management. Set your priorities, keep a calendar of events, and learn to say "no." These tools can help you avoid making too many commitments.  Techniques for  coping with stress include the following:  Rethinking the problem. Try to think realistically about stressful events rather than ignoring them or overreacting. Try to find the positives in a stressful situation rather than focusing on the negatives.  Exercise. Physical exercise can release both  physical and emotional tension. The key is to find a form of exercise you enjoy and do it regularly.  Relaxation techniques. These relax the body and mind. Examples include yoga, meditation, tai chi, biofeedback, deep breathing, progressive muscle relaxation, listening to music, being out in nature, journaling, and other hobbies. Again, the key is to find one or more that you enjoy and can do regularly.  Healthy lifestyle. Eat a balanced diet, get plenty of sleep, and do not smoke. Avoid using alcohol or drugs to relax.  Strong support network. Spend time with family, friends, or other people you enjoy being around.Express your feelings and talk things over with someone you trust.  Counseling or talktherapy with a mental health professional may be helpful if you are having difficulty managing stress on your own. Medicine is typically not recommended for the treatment of stress.Talk to your health care provider if you think you need medicine for symptoms of stress. Follow these instructions at home:  Keep all follow-up visits as directed by your health care provider.  Take all medicines as directed by your health care provider. Contact a health care provider if:  Your symptoms get worse or you start having new symptoms.  You feel overwhelmed by your problems and can no longer manage them on your own. Get help right away if:  You feel like hurting yourself or someone else. This information is not intended to replace advice given to you by your health care provider. Make sure you discuss any questions you have with your health care provider. Document Released: 03/29/2001 Document Revised: 03/10/2016 Document Reviewed: 05/28/2013 Elsevier Interactive Patient Education  2017 [employer].

## 2018-05-09 ENCOUNTER — Ambulatory Visit: Payer: Medicaid Other | Admitting: Family Medicine

## 2018-05-09 VITALS — BP 133/78 | HR 61 | Temp 97.1°F | Ht 65.0 in | Wt 150.0 lb

## 2018-05-09 DIAGNOSIS — R1084 Generalized abdominal pain: Secondary | ICD-10-CM

## 2018-05-09 DIAGNOSIS — R110 Nausea: Secondary | ICD-10-CM

## 2018-05-09 MED ORDER — ONDANSETRON 4 MG PO TBDP: 4.0000 mg | ORAL_TABLET | Freq: Three times a day (TID) | ORAL | 0 refills | Status: DC | PRN

## 2018-05-09 NOTE — Patient Instructions (Signed)
Thank you for coming in to clinic today.  1. Your symptoms are consistent with Constipation, likely cause of your General Abdominal Pain / Cramping. 2. Start with Miralax sent to pharmacy. First dose 68g (4 capfuls) in 32oz water over 1 to 2 hours for clean out. Next day start 17g or 1 capful daily, may adjust dose up or down by half a capful every few days. Recommend to take this medicine daily for next 1-2 weeks, then may need to use it longer if needed. - Goal is to have soft regular bowel movement 1-3x daily, if too runny or diarrhea, then reduce dose of the medicine  Improve water intake, hydration will help Also recommend increased vegetables, fruits, fiber intake Can try daily Metamucil or Fiber supplement at pharmacy over the counter  Follow-up if symptoms are not improving with bowel movements, or if pain worsens, develop fevers, nausea, vomiting.  If you have any other questions or concerns, please feel free to call the clinic to contact me. You may also schedule an earlier appointment if necessary.  However, if your symptoms get significantly worse, please go to the Emergency Department to seek immediate medical attention.

## 2018-05-09 NOTE — Progress Notes (Signed)
Subjective: CC: abdominal pain PCP: Janora Norlander, DO Savannah Shaw is a 57 y.o. female presenting to clinic today for:  1. Abdominal pain Patient reports a 3-day history of generalized abdominal pain, bloating, belching and nausea.  She states that she has not had a bowel movement since Sunday.  Denies any vomiting, diarrhea, fevers, sick contacts, blood in stool prior to onset of symptoms.  She has been hydrating fair but has not really eat anything since Monday.  She is taken MiraLAX Monday and Tuesday with no bowel movement.  She states that she has had constipation in the past and that twice she was placed on the MiraLAX by her urologist.  No use of opioids or stimulants.   ROS: Per HPI  Allergies  Allergen Reactions  . Terbinafine Hcl Rash    Lamasil  . Topamax [Topiramate] Rash   Past Medical History:  Diagnosis Date  . GERD (gastroesophageal reflux disease)   . 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 History of acute pyelonephritis    w/ urosepsis 12-31-2015  resolved  . History of kidney stones   . Hyperlipidemia   . Left ureteral stone   . Normal exercise tolerance test results    12-24-2015 (dr hochrein)  no chest pain, no signficant arrhythmias , 85% max heartrate achieved    Current Outpatient Medications:  .  DEPAKOTE 500 MG DR tablet, TAKE 2 TABLETS BY MOUTH 2 TIMES A DAY, Disp: 120 tablet, Rfl: 3 .  fish oil-omega-3 fatty acids 1000 MG capsule, Take 1 capsule by mouth daily.  , Disp: , Rfl:  .  hydrOXYzine (ATARAX/VISTARIL) 25 MG tablet, Take 1 tablet (25 mg total) by mouth 3 (three) times daily as needed for anxiety., Disp: 30 tablet, Rfl: 0 .  pantoprazole (PROTONIX) 40 MG tablet, Take 1 tablet (40 mg total) by mouth daily., Disp: 90 tablet, Rfl: 1 .  TEGRETOL 200 MG tablet, Take 1.5  tablets in the morning, 1 tablet at lunch and 1.5 tablets each evening., Disp: 120 tablet,  Rfl: 3 .  zonisamide (ZONEGRAN) 100 MG capsule, TAKE 1 CAPSULE BY MOUTH EVERY DAY, Disp: 30 capsule, Rfl: 3 Social History   Socioeconomic History  . Marital status: Single    Spouse name: Not on file  . Number of children: Not on file  . Years of education: Not on file  . Highest education level: Not on file  Occupational History  . Occupation: Disabled    Comment: Seizures  Social Needs  . Financial resource strain: Not on file  . Food insecurity:    Worry: Not on file    Inability: Not on file  . Transportation needs:    Medical: Not on file    Non-medical: Not on file  Tobacco Use  . Smoking status: Former Smoker    Packs/day: 1.00    Years: 5.00    Pack years: 5.00    Types: Cigarettes    Last attempt to quit: 01/11/2011    Years since quitting: 7.3  . Smokeless tobacco: Never Used  Substance and Sexual Activity  . Alcohol use: No  . Drug use: No  . Sexual activity: Not on file  Lifestyle  . Physical activity:    Days per week: Not on file    Minutes per session: Not on file  . Stress: Not on file  Relationships  . Social connections:  Talks on phone: Not on file    Gets together: Not on file    Attends religious service: Not on file    Active member of club or organization: Not on file    Attends meetings of clubs or organizations: Not on file    Relationship status: Not on file  . Intimate partner violence:    Fear of current or ex partner: Not on file    Emotionally abused: Not on file    Physically abused: Not on file    Forced sexual activity: Not on file  Other Topics Concern  . Not on file  Social History Narrative   Daily Caffeine use - 3   Family History  Problem Relation Age of Onset  . Diabetes Mother   . Heart disease Mother 35       No details about the type of heart disease.    . Prostate cancer Other   . Colon cancer Neg Hx     Objective: Office vital signs reviewed. BP 133/78   Pulse 61   Temp (!) 97.1 F (36.2 C) (Oral)    Ht 5\' 5"  (1.651 m)   Wt 150 lb (68 kg)   BMI 24.96 kg/m   Physical Examination:  General: Awake, alert, well nourished, nontoxic appearing. No acute distress HEENT: Normal    Eyes: extraocular membranes intact, sclera white    Throat: moist mucus membranes Cardio: regular rate and rhythm, S1S2 heard, no murmurs appreciated Pulm: clear to auscultation bilaterally, no wheezes, rhonchi or rales; normal work of breathing on room air GI: soft, mild generalized TTP; well healed postsurgical scars noted along right side of abdomen, some bloating noted, bowel sounds present but hypoactive x4, no hepatomegaly, no splenomegaly, no masses  Assessment/ Plan: 57 y.o. female   1. Generalized abdominal pain Suspect secondary to constipation.  Her abdomen was remarkable for mild generalized tenderness to palpation.  Vital signs are stable and she demonstrates no evidence of infection.  Differential diagnosis considered includes SBO.  However, she is having flatus.  Will trial MiraLAX cleanout.  She was prescribed Zofran for nausea as well.  If no substantial bowel movement by tomorrow, recommend proceeding with abdominal imaging.  We discussed reasons for emergent evaluation the emergency department.  Patient was good understanding and will follow-up as needed.  2. Nausea As above  Meds ordered this encounter  Medications  . ondansetron (ZOFRAN ODT) 4 MG disintegrating tablet    Sig: Take 1 tablet (4 mg total) by mouth every 8 (eight) hours as needed for nausea or vomiting.    Dispense:  20 tablet    Refill:  [place], DO [facility] [phone]

## 2018-06-22 DIAGNOSIS — N200 Calculus of kidney: Secondary | ICD-10-CM | POA: Diagnosis not present

## 2018-07-11 ENCOUNTER — Ambulatory Visit (INDEPENDENT_AMBULATORY_CARE_PROVIDER_SITE_OTHER): Payer: Medicaid Other

## 2018-07-11 ENCOUNTER — Ambulatory Visit: Payer: Medicaid Other | Admitting: Family Medicine

## 2018-07-11 VITALS — BP 135/86 | HR 72 | Temp 97.0°F | Ht 65.0 in | Wt 156.0 lb

## 2018-07-11 DIAGNOSIS — E782 Mixed hyperlipidemia: Secondary | ICD-10-CM

## 2018-07-11 DIAGNOSIS — Z01419 Encounter for gynecological examination (general) (routine) without abnormal findings: Secondary | ICD-10-CM | POA: Diagnosis not present

## 2018-07-11 DIAGNOSIS — G40909 Epilepsy, unspecified, not intractable, without status epilepticus: Secondary | ICD-10-CM | POA: Diagnosis not present

## 2018-07-11 DIAGNOSIS — Z124 Encounter for screening for malignant neoplasm of cervix: Secondary | ICD-10-CM

## 2018-07-11 DIAGNOSIS — Z23 Encounter for immunization: Secondary | ICD-10-CM

## 2018-07-11 DIAGNOSIS — Z780 Asymptomatic menopausal state: Secondary | ICD-10-CM | POA: Diagnosis not present

## 2018-07-11 DIAGNOSIS — K219 Gastro-esophageal reflux disease without esophagitis: Secondary | ICD-10-CM

## 2018-07-11 LAB — CBC WITH DIFFERENTIAL/PLATELET
BASOS ABS: 0 10*3/uL (ref 0.0–0.2)
Basos: 1 %
EOS (ABSOLUTE): 0 10*3/uL (ref 0.0–0.4)
EOS: 1 %
HEMATOCRIT: 35.4 % (ref 34.0–46.6)
HEMOGLOBIN: 12.8 g/dL (ref 11.1–15.9)
IMMATURE GRANULOCYTES: 0 %
Immature Grans (Abs): 0 10*3/uL (ref 0.0–0.1)
Lymphocytes Absolute: 1.8 10*3/uL (ref 0.7–3.1)
Lymphs: 45 %
MCH: 34.4 pg — ABNORMAL HIGH (ref 26.6–33.0)
MCHC: 36.2 g/dL — ABNORMAL HIGH (ref 31.5–35.7)
MCV: 95 fL (ref 79–97)
MONOS ABS: 0.4 10*3/uL (ref 0.1–0.9)
Monocytes: 10 %
NEUTROS PCT: 43 %
Neutrophils Absolute: 1.8 10*3/uL (ref 1.4–7.0)
Platelets: 258 10*3/uL (ref 150–450)
RBC: 3.72 x10E6/uL — AB (ref 3.77–5.28)
RDW: 12.6 % (ref 12.3–15.4)
WBC: 4.1 10*3/uL (ref 3.4–10.8)

## 2018-07-11 LAB — CMP14+EGFR
ALK PHOS: 59 IU/L (ref 39–117)
ALT: 9 IU/L (ref 0–32)
AST: 19 IU/L (ref 0–40)
Albumin/Globulin Ratio: 2 (ref 1.2–2.2)
Albumin: 4.3 g/dL (ref 3.5–5.5)
BUN/Creatinine Ratio: 15 (ref 9–23)
BUN: 8 mg/dL (ref 6–24)
CHLORIDE: 97 mmol/L (ref 96–106)
CO2: 19 mmol/L — AB (ref 20–29)
CREATININE: 0.54 mg/dL — AB (ref 0.57–1.00)
Calcium: 8.8 mg/dL (ref 8.7–10.2)
GFR calc Af Amer: 121 mL/min/{1.73_m2} (ref >59)
GFR calc non Af Amer: 105 mL/min/{1.73_m2} (ref >59)
GLUCOSE: 90 mg/dL (ref 65–99)
Globulin, Total: 2.2 g/dL (ref 1.5–4.5)
Potassium: 4.6 mmol/L (ref 3.5–5.2)
Sodium: 132 mmol/L — ABNORMAL LOW (ref 134–144)
Total Protein: 6.5 g/dL (ref 6.0–8.5)

## 2018-07-11 LAB — LIPID PANEL
Chol/HDL Ratio: 3.4 ratio (ref 0.0–4.4)
Cholesterol, Total: 220 mg/dL — ABNORMAL HIGH (ref 100–199)
HDL: 64 mg/dL (ref >39)
LDL CALC: 132 mg/dL — AB (ref 0–99)
TRIGLYCERIDES: 121 mg/dL (ref 0–149)
VLDL Cholesterol Cal: 24 mg/dL (ref 5–40)

## 2018-07-11 MED ORDER — ZONISAMIDE 100 MG PO CAPS: ORAL_CAPSULE | ORAL | 3 refills | Status: DC

## 2018-07-11 MED ORDER — DEPAKOTE 500 MG PO TBEC: DELAYED_RELEASE_TABLET | ORAL | 3 refills | Status: DC

## 2018-07-11 MED ORDER — TEGRETOL 200 MG PO TABS: ORAL_TABLET | ORAL | 3 refills | Status: DC

## 2018-07-11 MED ORDER — PANTOPRAZOLE SODIUM 40 MG PO TBEC: 40.0000 mg | DELAYED_RELEASE_TABLET | Freq: Every day | ORAL | 1 refills | Status: DC

## 2018-07-11 NOTE — Patient Instructions (Signed)
You had labs performed today.  You will be contacted with the results of the labs once they are available, usually in the next 3 business days for routine lab work.  If you had a pap smear or biopsy performed, expect to be contacted in about 7-10 days.  Health Maintenance, Female Adopting a healthy lifestyle and getting preventive care can go a long way to promote health and wellness. Talk with your health care provider about what schedule of regular examinations is right for you. This is a good chance for you to check in with your provider about disease prevention and staying healthy. In between checkups, there are plenty of things you can do on your own. Experts have done a lot of research about which lifestyle changes and preventive measures are most likely to keep you healthy. Ask your health care provider for more information. Weight and diet Eat a healthy diet  Be sure to include plenty of vegetables, fruits, low-fat dairy products, and lean protein.  Do not eat a lot of foods high in solid fats, added sugars, or salt.  Get regular exercise. This is one of the most important things you can do for your health. ? Most adults should exercise for at least 150 minutes each week. The exercise should increase your heart rate and make you sweat (moderate-intensity exercise). ? Most adults should also do strengthening exercises at least twice a week. This is in addition to the moderate-intensity exercise.  Maintain a healthy weight  Body mass index (BMI) is a measurement that can be used to identify possible weight problems. It estimates body fat based on height and weight. Your health care provider can help determine your BMI and help you achieve or maintain a healthy weight.  For females 20 years of age and older: ? A BMI below 18.5 is considered underweight. ? A BMI of 18.5 to 24.9 is normal. ? A BMI of 25 to 29.9 is considered overweight. ? A BMI of 30 and above is considered  obese.  Watch levels of cholesterol and blood lipids  You should start having your blood tested for lipids and cholesterol at 57 years of age, then have this test every 5 years.  You may need to have your cholesterol levels checked more often if: ? Your lipid or cholesterol levels are high. ? You are older than 57 years of age. ? You are at high risk for heart disease.  Cancer screening Lung Cancer  Lung cancer screening is recommended for adults 55-80 years old who are at high risk for lung cancer because of a history of smoking.  A yearly low-dose CT scan of the lungs is recommended for people who: ? Currently smoke. ? Have quit within the past 15 years. ? Have at least a 30-pack-year history of smoking. A pack year is smoking an average of one pack of cigarettes a day for 1 year.  Yearly screening should continue until it has been 15 years since you quit.  Yearly screening should stop if you develop a health problem that would prevent you from having lung cancer treatment.  Breast Cancer  Practice breast self-awareness. This means understanding how your breasts normally appear and feel.  It also means doing regular breast self-exams. Let your health care provider know about any changes, no matter how small.  If you are in your 20s or 30s, you should have a clinical breast exam (CBE) by a health care provider every 1-3 years as part of a   regular health exam.  If you are 40 or older, have a CBE every year. Also consider having a breast X-ray (mammogram) every year.  If you have a family history of breast cancer, talk to your health care provider about genetic screening.  If you are at high risk for breast cancer, talk to your health care provider about having an MRI and a mammogram every year.  Breast cancer gene (BRCA) assessment is recommended for women who have family members with BRCA-related cancers. BRCA-related cancers  include: ? Breast. ? Ovarian. ? Tubal. ? Peritoneal cancers.  Results of the assessment will determine the need for genetic counseling and BRCA1 and BRCA2 testing.  Cervical Cancer Your health care provider may recommend that you be screened regularly for cancer of the pelvic organs (ovaries, uterus, and vagina). This screening involves a pelvic examination, including checking for microscopic changes to the surface of your cervix (Pap test). You may be encouraged to have this screening done every 3 years, beginning at age 74.  For women ages 34-65, health care providers may recommend pelvic exams and Pap testing every 3 years, or they may recommend the Pap and pelvic exam, combined with testing for human papilloma virus (HPV), every 5 years. Some types of HPV increase your risk of cervical cancer. Testing for HPV may also be done on women of any age with unclear Pap test results.  Other health care providers may not recommend any screening for nonpregnant women who are considered low risk for pelvic cancer and who do not have symptoms. Ask your health care provider if a screening pelvic exam is right for you.  If you have had past treatment for cervical cancer or a condition that could lead to cancer, you need Pap tests and screening for cancer for at least 20 years after your treatment. If Pap tests have been discontinued, your risk factors (such as having a new sexual partner) need to be reassessed to determine if screening should resume. Some women have medical problems that increase the chance of getting cervical cancer. In these cases, your health care provider may recommend more frequent screening and Pap tests.  Colorectal Cancer  This type of cancer can be detected and often prevented.  Routine colorectal cancer screening usually begins at 57 years of age and continues through 57 years of age.  Your health care provider may recommend screening at an earlier age if you have risk factors  for colon cancer.  Your health care provider may also recommend using home test kits to check for hidden blood in the stool.  A small camera at the end of a tube can be used to examine your colon directly (sigmoidoscopy or colonoscopy). This is done to check for the earliest forms of colorectal cancer.  Routine screening usually begins at age 70.  Direct examination of the colon should be repeated every 5-10 years through 57 years of age. However, you may need to be screened more often if early forms of precancerous polyps or small growths are found.  Skin Cancer  Check your skin from head to toe regularly.  Tell your health care provider about any new moles or changes in moles, especially if there is a change in a mole's shape or color.  Also tell your health care provider if you have a mole that is larger than the size of a pencil eraser.  Always use sunscreen. Apply sunscreen liberally and repeatedly throughout the day.  Protect yourself by wearing long sleeves, pants,  a wide-brimmed hat, and sunglasses whenever you are outside.  Heart disease, diabetes, and high blood pressure  High blood pressure causes heart disease and increases the risk of stroke. High blood pressure is more likely to develop in: ? People who have blood pressure in the high end of the normal range (130-139/85-89 mm Hg). ? People who are overweight or obese. ? People who are African American.  If you are 82-70 years of age, have your blood pressure checked every 3-5 years. If you are 56 years of age or older, have your blood pressure checked every year. You should have your blood pressure measured twice-once when you are at a hospital or clinic, and once when you are not at a hospital or clinic. Record the average of the two measurements. To check your blood pressure when you are not at a hospital or clinic, you can use: ? An automated blood pressure machine at a pharmacy. ? A home blood pressure monitor.  If  you are between 62 years and 4 years old, ask your health care provider if you should take aspirin to prevent strokes.  Have regular diabetes screenings. This involves taking a blood sample to check your fasting blood sugar level. ? If you are at a normal weight and have a low risk for diabetes, have this test once every three years after 57 years of age. ? If you are overweight and have a high risk for diabetes, consider being tested at a younger age or more often. Preventing infection Hepatitis B  If you have a higher risk for hepatitis B, you should be screened for this virus. You are considered at high risk for hepatitis B if: ? You were born in a country where hepatitis B is common. Ask your health care provider which countries are considered high risk. ? Your parents were born in a high-risk country, and you have not been immunized against hepatitis B (hepatitis B vaccine). ? You have HIV or AIDS. ? You use needles to inject street drugs. ? You live with someone who has hepatitis B. ? You have had sex with someone who has hepatitis B. ? You get hemodialysis treatment. ? You take certain medicines for conditions, including cancer, organ transplantation, and autoimmune conditions.  Hepatitis C  Blood testing is recommended for: ? Everyone born from [birth year] through 1965. ? Anyone with known risk factors for hepatitis C.  Sexually transmitted infections (STIs)  You should be screened for sexually transmitted infections (STIs) including gonorrhea and chlamydia if: ? You are sexually active and are younger than 57 years of age. ? You are older than 57 years of age and your health care provider tells you that you are at risk for this type of infection. ? Your sexual activity has changed since you were last screened and you are at an increased risk for chlamydia or gonorrhea. Ask your health care provider if you are at risk.  If you do not have HIV, but are at risk, it may be recommended  that you take a prescription medicine daily to prevent HIV infection. This is called pre-exposure prophylaxis (PrEP). You are considered at risk if: ? You are sexually active and do not regularly use condoms or know the HIV status of your partner(s). ? You take drugs by injection. ? You are sexually active with a partner who has HIV.  Talk with your health care provider about whether you are at high risk of being infected with HIV. If you choose  to begin PrEP, you should first be tested for HIV. You should then be tested every 3 months for as long as you are taking PrEP. Pregnancy  If you are premenopausal and you may become pregnant, ask your health care provider about preconception counseling.  If you may become pregnant, take 400 to 800 micrograms (mcg) of folic acid every day.  If you want to prevent pregnancy, talk to your health care provider about birth control (contraception). Osteoporosis and menopause  Osteoporosis is a disease in which the bones lose minerals and strength with aging. This can result in serious bone fractures. Your risk for osteoporosis can be identified using a bone density scan.  If you are 26 years of age or older, or if you are at risk for osteoporosis and fractures, ask your health care provider if you should be screened.  Ask your health care provider whether you should take a calcium or vitamin D supplement to lower your risk for osteoporosis.  Menopause may have certain physical symptoms and risks.  Hormone replacement therapy may reduce some of these symptoms and risks. Talk to your health care provider about whether hormone replacement therapy is right for you. Follow these instructions at home:  Schedule regular health, dental, and eye exams.  Stay current with your immunizations.  Do not use any tobacco products including cigarettes, chewing tobacco, or electronic cigarettes.  If you are pregnant, do not drink alcohol.  If you are  breastfeeding, limit how much and how often you drink alcohol.  Limit alcohol intake to no more than 1 drink per day for nonpregnant women. One drink equals 12 ounces of beer, 5 ounces of wine, or 1 ounces of hard liquor.  Do not use street drugs.  Do not share needles.  Ask your health care provider for help if you need support or information about quitting drugs.  Tell your health care provider if you often feel depressed.  Tell your health care provider if you have ever been abused or do not feel safe at home. This information is not intended to replace advice given to you by your health care provider. Make sure you discuss any questions you have with your health care provider. Document Released: 04/18/2011 Document Revised: 03/10/2016 Document Reviewed: 07/07/2015 Elsevier Interactive Patient Education  [employer].

## 2018-07-11 NOTE — Progress Notes (Signed)
Savannah Shaw is a 57 y.o. female presents to office today for annual physical exam examination.    Concerns today include: 1. None.  Last colonoscopy: 2010 Last mammogram: 2017 Last pap smear: 2016 Immunizations needed: Flu Vaccine: YEs  Past Medical History:  Diagnosis Date  . GERD (gastroesophageal reflux disease)   . 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 History of acute pyelonephritis    w/ urosepsis 12-31-2015  resolved  . History of kidney stones   . Hyperlipidemia   . Left ureteral stone   . Normal exercise tolerance test results    12-24-2015 (dr hochrein)  no chest pain, no signficant arrhythmias , 85% max heartrate achieved   Social History   Socioeconomic History  . Marital status: Single    Spouse name: Not on file  . Number of children: Not on file  . Years of education: Not on file  . Highest education level: Not on file  Occupational History  . Occupation: Disabled    Comment: Seizures  Social Needs  . Financial resource strain: Not on file  . Food insecurity:    Worry: Not on file    Inability: Not on file  . Transportation needs:    Medical: Not on file    Non-medical: Not on file  Tobacco Use  . Smoking status: Former Smoker    Packs/day: 1.00    Years: 5.00    Pack years: 5.00    Types: Cigarettes    Last attempt to quit: 01/11/2011    Years since quitting: 7.5  . Smokeless tobacco: Never Used  Substance and Sexual Activity  . Alcohol use: No  . Drug use: No  . Sexual activity: Not on file  Lifestyle  . Physical activity:    Days per week: Not on file    Minutes per session: Not on file  . Stress: Not on file  Relationships  . Social connections:    Talks on phone: Not on file    Gets together: Not on file    Attends religious service: Not on file    Active member of club or organization: Not on file    Attends meetings of clubs or organizations: Not on  file    Relationship status: Not on file  . Intimate partner violence:    Fear of current or ex partner: Not on file    Emotionally abused: Not on file    Physically abused: Not on file    Forced sexual activity: Not on file  Other Topics Concern  . Not on file  Social History Narrative   Daily Caffeine use - 3   Past Surgical History:  Procedure Laterality Date  . APPENDECTOMY    . CARDIOVASCULAR STRESS TEST  07-20-2011   normal nuclear study/  no ischemia, normal LV function and wall motion, ef 76%  . CHOLECYSTECTOMY OPEN  1970's   w/  Appendectomy  . CYSTOSCOPY W/ URETERAL STENT PLACEMENT Left 12/31/2015   Procedure: CYSTOSCOPY WITH RETROGRADE PYELOGRAM/URETERAL STENT PLACEMENT;  Surgeon: Cleon Gustin, MD;  Location: [facility];  Service: Urology;  Laterality: Left;  . CYSTOSCOPY W/ URETERAL STENT PLACEMENT Left 01/14/2016   Procedure: CYSTOSCOPY WITH STENT REPLACEMENT;  Surgeon: Cleon Gustin, MD;  Location: [facility];  Service: Urology;  Laterality: Left;  . CYSTOSCOPY/RETROGRADE/URETEROSCOPY/STONE EXTRACTION WITH BASKET Left 01/14/2016   Procedure: CYSTOSCOPY/RETROGRADE/URETEROSCOPY/STONE EXTRACTION WITH BASKET;  Surgeon: Cleon Gustin, MD;  Location: [facility];  Service: Urology;  Laterality: Left;  . HOLMIUM LASER APPLICATION Left 4/66/5993   Procedure: HOLMIUM LASER APPLICATION;  Surgeon: Cleon Gustin, MD;  Location: [facility];  Service: Urology;  Laterality: Left;  . OOPHORECTOMY  1980's   Unilateral oophorectomy and  Bilateral Salpingectomy (tubal)  . TONSILLECTOMY AND ADENOIDECTOMY  as child  . TRANSTHORACIC ECHOCARDIOGRAM  07-20-2011   ef 55-65%/  trivial MR/  mild TR   Family History  Problem Relation Age of Onset  . Diabetes Mother   . Heart disease Mother 51       No details about the type of heart disease.    . Prostate cancer Other   . Colon cancer Neg Hx     Current Outpatient Medications:    .  DEPAKOTE 500 MG DR tablet, TAKE 2 TABLETS BY MOUTH 2 TIMES A DAY, Disp: 120 tablet, Rfl: 3 .  fish oil-omega-3 fatty acids 1000 MG capsule, Take 1 capsule by mouth daily.  , Disp: , Rfl:  .  hydrOXYzine (ATARAX/VISTARIL) 25 MG tablet, Take 1 tablet (25 mg total) by mouth 3 (three) times daily as needed for anxiety., Disp: 30 tablet, Rfl: 0 .  ondansetron (ZOFRAN ODT) 4 MG disintegrating tablet, Take 1 tablet (4 mg total) by mouth every 8 (eight) hours as needed for nausea or vomiting., Disp: 20 tablet, Rfl: 0 .  pantoprazole (PROTONIX) 40 MG tablet, Take 1 tablet (40 mg total) by mouth daily., Disp: 90 tablet, Rfl: 1 .  TEGRETOL 200 MG tablet, Take 1.5  tablets in the morning, 1 tablet at lunch and 1.5 tablets each evening., Disp: 120 tablet, Rfl: 3 .  zonisamide (ZONEGRAN) 100 MG capsule, TAKE 1 CAPSULE BY MOUTH EVERY DAY, Disp: 30 capsule, Rfl: 3  Allergies  Allergen Reactions  . Terbinafine Hcl Rash    Lamasil  . Topamax [Topiramate] Rash     ROS: Review of Systems Constitutional: negative Eyes: negative Ears, nose, mouth, throat, and face: positive for some difficulty hearing,  has been evaluated before Respiratory: negative Cardiovascular: negative Gastrointestinal: positive for occasional diarrhea Genitourinary:negative Integument/breast: negative Hematologic/lymphatic: negative Musculoskeletal:negative Neurological: positive for seizure disorder but notes no seizure activity Behavioral/Psych: negative Endocrine: negative Allergic/Immunologic: negative    Physical exam BP 135/86   Pulse 72   Temp (!) 97 F (36.1 C) (Oral)   Ht '5\' 5"'  (1.651 m)   Wt 156 lb (70.8 kg)   BMI 25.96 kg/m  General appearance: alert, cooperative, appears stated age and no distress Head: Normocephalic, without obvious abnormality, atraumatic Eyes: negative findings: lids and lashes normal, conjunctivae and sclerae normal, corneas clear and pupils equal, round, reactive to light and  accomodation Ears: normal TM's and external ear canals both ears Nose: Nares normal. Septum midline. Mucosa normal. No drainage or sinus tenderness. Throat: lips, mucosa, and tongue normal; teeth and gums normal Neck: no adenopathy, supple, symmetrical, trachea midline and thyroid not enlarged, symmetric, no tenderness/mass/nodules Back: symmetric, no curvature. ROM normal. No CVA tenderness. Lungs: clear to auscultation bilaterally Breasts: normal appearance, no masses or tenderness Heart: regular rate and rhythm, S1, S2 normal, no murmur, click, rub or gallop Abdomen: soft, non-tender; bowel sounds normal; no masses,  no organomegaly and well healed post surgical sites noted Pelvic: cervix normal in appearance, no adnexal masses or tenderness, no cervical motion tenderness, rectovaginal septum normal, uterus normal size, shape, and consistency and vagina normal without discharge Extremities: extremities normal,  atraumatic, no cyanosis or edema Pulses: 2+ and symmetric Skin: multiple nevi.  seborrheic keratosis on right shoulder Lymph nodes: Cervical, supraclavicular, and axillary nodes normal. Neurologic: Grossly normal  Psych: mood stable, speech normal, affect appropriate. Depression screen [facility] 2/9 07/11/2018 05/09/2018 04/11/2018  Decreased Interest 0 0 2  Down, Depressed, Hopeless 2 0 1  PHQ - 2 Score 2 0 3  Altered sleeping 1 - 2  Tired, decreased energy 1 - 1  Change in appetite 0 - 2  Feeling bad or failure about yourself  0 - 2  Trouble concentrating 0 - 0  Moving slowly or fidgety/restless 1 - 0  Suicidal thoughts 0 - 0  PHQ-9 Score 5 - 10  Difficult doing work/chores Not difficult at all - -   Assessment/ Plan: Izola Price here for annual physical exam.   1. Well woman exam with routine gynecological exam - Pap IG and HPV (high risk) DNA detection  2. Screening for cervical cancer - Pap IG and HPV (high risk) DNA detection  3. Seizure disorder ([place]) No seizure  activity - CMP14+EGFR - CBC with Differential - zonisamide (ZONEGRAN) 100 MG capsule; TAKE 1 CAPSULE BY MOUTH EVERY DAY  Dispense: 30 capsule; Refill: 3  4. Mixed hyperlipidemia - Lipid Panel  5. Gastroesophageal reflux disease, esophagitis presence not specified - pantoprazole (PROTONIX) 40 MG tablet; Take 1 tablet (40 mg total) by mouth daily.  Dispense: 90 tablet; Refill: 1  6. Need for influenza vaccination Flu shot administered  7. Asymptomatic postmenopausal state - DG [facility] DEXA; Future   Handout provided on healthy lifestyle choices, including diet (rich in fruits, vegetables and lean meats and low in salt and simple carbohydrates) and exercise (at least 30 minutes of moderate physical activity daily).  Patient to follow up in 3 months for seizure disorder.  Amanda Steuart M. Lajuana Ripple, DO

## 2018-07-13 DIAGNOSIS — M818 Other osteoporosis without current pathological fracture: Secondary | ICD-10-CM | POA: Diagnosis not present

## 2018-07-13 LAB — PAP IG AND HPV HIGH-RISK
HPV, HIGH-RISK: POSITIVE — AB
PAP SMEAR COMMENT: 0

## 2018-07-16 ENCOUNTER — Encounter: Payer: Self-pay | Admitting: Family Medicine

## 2018-07-16 DIAGNOSIS — M810 Age-related osteoporosis without current pathological fracture: Secondary | ICD-10-CM | POA: Insufficient documentation

## 2018-07-23 ENCOUNTER — Encounter: Payer: Medicaid Other | Admitting: Family Medicine

## 2018-09-11 ENCOUNTER — Encounter: Payer: Self-pay | Admitting: Family Medicine

## 2018-09-11 ENCOUNTER — Ambulatory Visit: Payer: Medicaid Other | Admitting: Family Medicine

## 2018-09-11 VITALS — BP 123/76 | HR 73 | Temp 97.0°F | Ht 65.0 in | Wt 161.0 lb

## 2018-09-11 DIAGNOSIS — R4589 Other symptoms and signs involving emotional state: Secondary | ICD-10-CM | POA: Insufficient documentation

## 2018-09-11 DIAGNOSIS — R87810 Cervical high risk human papillomavirus (HPV) DNA test positive: Secondary | ICD-10-CM

## 2018-09-11 DIAGNOSIS — F329 Major depressive disorder, single episode, unspecified: Secondary | ICD-10-CM

## 2018-09-11 DIAGNOSIS — M810 Age-related osteoporosis without current pathological fracture: Secondary | ICD-10-CM | POA: Diagnosis not present

## 2018-09-11 DIAGNOSIS — L729 Follicular cyst of the skin and subcutaneous tissue, unspecified: Secondary | ICD-10-CM

## 2018-09-11 DIAGNOSIS — Z1231 Encounter for screening mammogram for malignant neoplasm of breast: Secondary | ICD-10-CM | POA: Diagnosis not present

## 2018-09-11 LAB — HM MAMMOGRAPHY

## 2018-09-11 MED ORDER — ALENDRONATE SODIUM 70 MG PO TABS: 70.0000 mg | ORAL_TABLET | ORAL | 11 refills | Status: DC
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
## 2018-09-11 NOTE — Patient Instructions (Addendum)
We discussed your results today.   Medicaid requires failure of bisphosphonate before Prolia (the injection we discussed). I am checking your Thyroid, Parathyroid, Vitamin D and Calcium levels today. I have prescribed generic Fosamax, which is a once a week medication for osteoporosis.  This is the only one they will cover. DO NOT START this until I get your Vitamin D level back.  We may have to replete your Vitamin levels before starting osteoporosis medications.  Alendronate weekly tablets What is this medicine? ALENDRONATE (a LEN droe nate) slows calcium loss from bones. It helps to make healthy bone and to slow bone loss in people with osteoporosis. It may be used to treat Paget's disease. This medicine may be used for other purposes; ask your health care provider or pharmacist if you have questions. COMMON BRAND NAME(S): Fosamax What should I tell my health care provider before I take this medicine? They need to know if you have any of these conditions: -esophagus, stomach, or intestine problems, like acid-reflux or GERD -dental disease -kidney disease -low blood calcium -low vitamin D -problems swallowing -problems sitting or standing for 30 minutes -an unusual or allergic reaction to alendronate, other medicines, foods, dyes, or preservatives -pregnant or trying to get pregnant -breast-feeding How should I use this medicine? You must take this medicine exactly as directed or you will lower the amount of medicine you absorb into your body or you may cause yourself harm. Take your dose by mouth first thing in the morning, after you are up for the day. Do not eat or drink anything before you take this medicine. Swallow your medicine with a full glass (6 to 8 fluid ounces) of plain water. Do not take this tablet with any other drink. Do not chew or crush the tablet. After taking this medicine, do not eat breakfast, drink, or take any medicines or vitamins for at least 30 minutes. Stand or  sit up for at least 30 minutes after you take this medicine; do not lie down. Take this medicine on the same day every week. Do not take your medicine more often than directed. Talk to your pediatrician regarding the use of this medicine in children. Special care may be needed. Overdosage: If you think you have taken too much of this medicine contact a poison control center or emergency room at once. NOTE: This medicine is only for you. Do not share this medicine with others. What if I miss a dose? If you miss a dose, take the dose on the morning after you remember. Then take your next dose on your regular day of the week. Never take 2 tablets on the same day. Do not take double or extra doses. What may interact with this medicine? -aluminum hydroxide -antacids -aspirin -calcium supplements -drugs for inflammation like ibuprofen, naproxen, and others -iron supplements -magnesium supplements -vitamins with minerals This list may not describe all possible interactions. Give your health care provider a list of all the medicines, herbs, non-prescription drugs, or dietary supplements you use. Also tell them if you smoke, drink alcohol, or use illegal drugs. Some items may interact with your medicine. What should I watch for while using this medicine? Visit your doctor or health care professional for regular checks ups. It may be some time before you see benefit from this medicine. Do not stop taking your medication except on your doctor's advice. Your doctor or health care professional may order blood tests and other tests to see how you are doing. You should  make sure you get enough calcium and vitamin D while you are taking this medicine, unless your doctor tells you not to. Discuss the foods you eat and the vitamins you take with your health care professional. Some people who take this medicine have severe bone, joint, and/or muscle pain. This medicine may also increase your risk for a broken thigh  bone. Tell your doctor right away if you have pain in your upper leg or groin. Tell your doctor if you have any pain that does not go away or that gets worse. This medicine can make you more sensitive to the sun. If you get a rash while taking this medicine, sunlight may cause the rash to get worse. Keep out of the sun. If you cannot avoid being in the sun, wear protective clothing and use sunscreen. Do not use sun lamps or tanning beds/booths. What side effects may I notice from receiving this medicine? Side effects that you should report to your doctor or health care professional as soon as possible: -allergic reactions like skin rash, itching or hives, swelling of the face, lips, or tongue -black or tarry stools -bone, muscle or joint pain -changes in vision -chest pain -heartburn or stomach pain -jaw pain, especially after dental work -pain or trouble when swallowing -redness, blistering, peeling or loosening of the skin, including inside the mouth Side effects that usually do not require medical attention (report to your doctor or health care professional if they continue or are bothersome): -changes in taste -diarrhea or constipation -eye pain or itching -headache -nausea or vomiting -stomach gas or fullness This list may not describe all possible side effects. Call your doctor for medical advice about side effects. You may report side effects to FDA at 1-800-FDA-1088. Where should I keep my medicine? Keep out of the reach of children. Store at room temperature of 15 and 30 degrees C (59 and 86 degrees F). Throw away any unused medicine after the expiration date. NOTE: This sheet is a summary. It may not cover all possible information. If you have questions about this medicine, talk to your doctor, pharmacist, or health care provider.  2018 Elsevier/Gold Standard (2011-08-18 09:02:42)

## 2018-09-12 ENCOUNTER — Other Ambulatory Visit: Payer: Self-pay | Admitting: Family Medicine

## 2018-09-12 LAB — CBC
Hematocrit: 36.7 % (ref 34.0–46.6)
Hemoglobin: 12.8 g/dL (ref 11.1–15.9)
MCH: 32.5 pg (ref 26.6–33.0)
MCHC: 34.9 g/dL (ref 31.5–35.7)
MCV: 93 fL (ref 79–97)
Platelets: 257 10*3/uL (ref 150–450)
RBC: 3.94 x10E6/uL (ref 3.77–5.28)
RDW: 12.5 % (ref 12.3–15.4)
WBC: 4.1 10*3/uL (ref 3.4–10.8)

## 2018-09-12 LAB — CMP14+EGFR
ALBUMIN: 4.4 g/dL (ref 3.5–5.5)
ALT: 11 IU/L (ref 0–32)
AST: 20 IU/L (ref 0–40)
Albumin/Globulin Ratio: 2.1 (ref 1.2–2.2)
Alkaline Phosphatase: 64 IU/L (ref 39–117)
BUN / CREAT RATIO: 16 (ref 9–23)
BUN: 10 mg/dL (ref 6–24)
Bilirubin Total: <0.2 mg/dL (ref 0.0–1.2)
CO2: 21 mmol/L (ref 20–29)
Calcium: 8.7 mg/dL (ref 8.7–10.2)
Chloride: 97 mmol/L (ref 96–106)
Creatinine, Ser: 0.63 mg/dL (ref 0.57–1.00)
GFR, EST AFRICAN AMERICAN: 115 mL/min/{1.73_m2} (ref >59)
GFR, EST NON AFRICAN AMERICAN: 100 mL/min/{1.73_m2} (ref >59)
GLUCOSE: 91 mg/dL (ref 65–99)
Globulin, Total: 2.1 g/dL (ref 1.5–4.5)
Potassium: 4.4 mmol/L (ref 3.5–5.2)
Sodium: 133 mmol/L — ABNORMAL LOW (ref 134–144)
TOTAL PROTEIN: 6.5 g/dL (ref 6.0–8.5)

## 2018-09-12 LAB — PTH, INTACT AND CALCIUM: PTH: 35 pg/mL (ref 15–65)

## 2018-09-12 LAB — VITAMIN D 25 HYDROXY (VIT D DEFICIENCY, FRACTURES): Vit D, 25-Hydroxy: 14.2 ng/mL — ABNORMAL LOW (ref 30.0–100.0)

## 2018-09-12 LAB — TSH: TSH: 1.52 u[IU]/mL (ref 0.450–4.500)

## 2018-09-17 ENCOUNTER — Other Ambulatory Visit: Payer: Self-pay | Admitting: Family Medicine

## 2018-09-17 MED ORDER — CHOLECALCIFEROL 1.25 MG (50000 UT) PO CAPS: 50000.0000 [IU] | ORAL_CAPSULE | ORAL | 0 refills | Status: DC

## 2018-12-12 ENCOUNTER — Ambulatory Visit: Payer: Medicaid Other | Admitting: Family Medicine

## 2018-12-12 ENCOUNTER — Other Ambulatory Visit: Payer: Self-pay | Admitting: Family Medicine

## 2018-12-12 VITALS — BP 138/83 | HR 78 | Temp 97.8°F | Ht 65.0 in | Wt 160.0 lb

## 2018-12-12 DIAGNOSIS — Z23 Encounter for immunization: Secondary | ICD-10-CM

## 2018-12-12 DIAGNOSIS — E663 Overweight: Secondary | ICD-10-CM

## 2018-12-12 DIAGNOSIS — G40909 Epilepsy, unspecified, not intractable, without status epilepticus: Secondary | ICD-10-CM | POA: Diagnosis not present

## 2018-12-12 DIAGNOSIS — Z713 Dietary counseling and surveillance: Secondary | ICD-10-CM

## 2018-12-12 DIAGNOSIS — K219 Gastro-esophageal reflux disease without esophagitis: Secondary | ICD-10-CM | POA: Diagnosis not present

## 2018-12-12 DIAGNOSIS — J329 Chronic sinusitis, unspecified: Secondary | ICD-10-CM | POA: Diagnosis not present

## 2018-12-12 DIAGNOSIS — J310 Chronic rhinitis: Secondary | ICD-10-CM

## 2018-12-12 DIAGNOSIS — M810 Age-related osteoporosis without current pathological fracture: Secondary | ICD-10-CM

## 2018-12-12 MED ORDER — TEGRETOL 200 MG PO TABS: ORAL_TABLET | ORAL | 0 refills | Status: DC

## 2018-12-12 MED ORDER — DEPAKOTE 500 MG PO TBEC: DELAYED_RELEASE_TABLET | ORAL | 3 refills | Status: DC

## 2018-12-12 MED ORDER — PANTOPRAZOLE SODIUM 40 MG PO TBEC: 40.0000 mg | DELAYED_RELEASE_TABLET | Freq: Every day | ORAL | 1 refills | Status: DC

## 2018-12-12 MED ORDER — FLUTICASONE PROPIONATE 50 MCG/ACT NA SUSP: 2.0000 | Freq: Every day | NASAL | 6 refills | Status: DC

## 2018-12-12 NOTE — Progress Notes (Signed)
Subjective: CC: Osteoporosis PCP: Janora Norlander, DO PXT:GGYIR Jerilynn Mages Savannah Shaw is a 58 y.o. female presenting to clinic today for:  1.  Osteoporosis Patient seen in December after having DEXA scan with T score of -3.8.  Her vitamin D level was low at that time and she has been on replacement since with 50,000 international units of cholecalciferol.  She has maybe 1 or 2 pills left.  Calcium level was within normal limits.  She has not picked up the prescription for Fosamax yet.  2.  Seizure disorder Continues to have intermittent twitches of the lower extremities but no overt grand mal or petit mall seizures.  She reports compliance with Depakote, Tegretol and Zonegran.  3.  Sinus drainage Patient reports intermittent, waxing and waning sinus drainage and postnasal drip.  She is not currently on any oral antihistamines or any nasal sprays.  She has tolerated nasal sprays in the past without difficulty.  No fevers, purulence from nares.  4.  Overweight Patient wanted to talk about possible medications for weight loss.  She notes that she maintains a low-carb diet, often eating vegetables with every meal.  She eats grilled foods only and avoids fried foods totally.  She has some physical activity but not as much as in the spring and summer months.  She walks for physical activity.  She has never been on any weight loss medicines.  She is not sure what is safe to take given seizure disorder.  ROS: Per HPI  Allergies  Allergen Reactions  . Terbinafine Hcl Rash    Lamasil  . Topamax [Topiramate] Rash   Past Medical History:  Diagnosis Date  . GERD (gastroesophageal reflux disease)   . 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 History of acute pyelonephritis    w/ urosepsis 12-31-2015  resolved  . History of kidney stones   . Hyperlipidemia   . Left ureteral stone   . Normal exercise tolerance test results    12-24-2015 (dr hochrein)  no chest pain, no signficant arrhythmias , 85% max heartrate achieved    Current Outpatient Medications:  .  Cholecalciferol 1.25 MG (50000 UT) capsule, Take 1 capsule (50,000 Units total) by mouth every 7 (seven) days., Disp: 12 capsule, Rfl: 0 .  DEPAKOTE 500 MG DR tablet, TAKE 2 TABLETS BY MOUTH 2 TIMES A DAY, Disp: 120 tablet, Rfl: 3 .  fish oil-omega-3 fatty acids 1000 MG capsule, Take 1 capsule by mouth daily.  , Disp: , Rfl:  .  pantoprazole (PROTONIX) 40 MG tablet, Take 1 tablet (40 mg total) by mouth daily., Disp: 90 tablet, Rfl: 1 .  TEGRETOL 200 MG tablet, TAKE 1 & 1/2 (ONE & ONE-HALF) TABLETS BY MOUTH IN THE MORNING AND 1 WITH LUNCH AND 1 & 1/2 (ONE & ONE-HALF) IN THE EVENING, Disp: 120 tablet, Rfl: 0 .  zonisamide (ZONEGRAN) 100 MG capsule, TAKE 1 CAPSULE BY MOUTH EVERY DAY, Disp: 30 capsule, Rfl: 3 .  alendronate (FOSAMAX) 70 MG tablet, Take 1 tablet (70 mg total) by mouth every 7 (seven) days. Take with a full glass of water on an empty stomach. (Patient not taking: Reported on 12/12/2018), Disp: 4 tablet, Rfl: 11 Social History   Socioeconomic History  . Marital status: Single    Spouse name: Not on file  . Number of children: Not on file  . Years of education: Not on file  . Highest  education level: Not on file  Occupational History  . Occupation: Disabled    Comment: Seizures  Social Needs  . Financial resource strain: Not on file  . Food insecurity:    Worry: Not on file    Inability: Not on file  . Transportation needs:    Medical: Not on file    Non-medical: Not on file  Tobacco Use  . Smoking status: Former Smoker    Packs/day: 1.00    Years: 5.00    Pack years: 5.00    Types: Cigarettes    Last attempt to quit: 01/11/2011    Years since quitting: 7.9  . Smokeless tobacco: Never Used  Substance and Sexual Activity  . Alcohol use: No  . Drug use: No  . Sexual activity: Not on file  Lifestyle  . Physical activity:    Days per  week: Not on file    Minutes per session: Not on file  . Stress: Not on file  Relationships  . Social connections:    Talks on phone: Not on file    Gets together: Not on file    Attends religious service: Not on file    Active member of club or organization: Not on file    Attends meetings of clubs or organizations: Not on file    Relationship status: Not on file  . Intimate partner violence:    Fear of current or ex partner: Not on file    Emotionally abused: Not on file    Physically abused: Not on file    Forced sexual activity: Not on file  Other Topics Concern  . Not on file  Social History Narrative   Daily Caffeine use - 3   Family History  Problem Relation Age of Onset  . Diabetes Mother   . Heart disease Mother 38       No details about the type of heart disease.    . Prostate cancer Other   . Colon cancer Neg Hx     Objective: Office vital signs reviewed. BP 138/83   Pulse 78   Temp 97.8 F (36.6 C) (Oral)   Ht _0  (1.651 m)   Wt 160 lb (72.6 kg)   BMI 26.63 kg/m   Physical Examination:  General: Awake, alert, well nourished, No acute distress HEENT: Sclera white.  Moist mucous membranes.  Nasal turbinates slightly edematous and erythematous.  She has clear nasal drainage. Cardio: regular rate and rhythm. Pulm: normal work of breathing on room air.  No wheezes. Psych: Mood stable, speech normal, affect appropriate Depression screen [facility] 2/9 12/12/2018 09/11/2018 07/11/2018  Decreased Interest 0 1 0  Down, Depressed, Hopeless 0 2 2  PHQ - 2 Score 0 3 2  Altered sleeping _1 Tired, decreased energy _2 Change in appetite 0 1 0  Feeling bad or failure about yourself  0 1 0  Trouble concentrating 0 0 0  Moving slowly or fidgety/restless 0 1 1  Suicidal thoughts 0 0 0  PHQ-9 Score _3 Difficult doing work/chores - Somewhat difficult Not difficult at all   Assessment/ Plan: 58 y.o. female   1. Age-related osteoporosis without current  pathological fracture Has 1 or 2 vitamin D pills left.  She has been on supplementation of 50,000 international units of vitamin D since December.  Recheck vitamin D level, CMP.  If appropriate, can proceed with Fosamax - VITAMIN D 25 Hydroxy (Vit-D Deficiency, Fractures) - CMP14+EGFR  2. Seizure disorder ([place]) Stable.  Check CMP and CBC.  Refills on Depakote and Tegretol sent. - CMP14+EGFR - CBC - DEPAKOTE 500 MG DR tablet; TAKE 2 TABLETS BY MOUTH 2 TIMES A DAY  Dispense: 120 tablet; Refill: 3 - TEGRETOL 200 MG tablet; TAKE 1 & 1/2 (ONE & ONE-HALF) TABLETS BY MOUTH IN THE MORNING AND 1 WITH LUNCH AND 1 & 1/2 (ONE & ONE-HALF) IN THE EVENING  Dispense: 120 tablet; Refill: 0  3. Overweight (BMI 25.0-29.9) We discussed Mediterranean diet, increase physical activity.  It sounds like overall she is maintaining a fairly low-fat, low-carb diet.  Unfortunately, she is not a great candidate for many of the weight loss medications because of seizure disorder.  I have also given her handout with [facility] information.  Offered referral to dietitian and should she desire.  4. Weight loss counseling, encounter for As above  5. Gastroesophageal reflux disease, esophagitis presence not specified Stable - pantoprazole (PROTONIX) 40 MG tablet; Take 1 tablet (40 mg total) by mouth daily.  Dispense: 90 tablet; Refill: 1  6. Rhinosinusitis No evidence of bacterial infection.  Flonase nasal spray prescribed.  Instructions for use discussed.   Orders Placed This Encounter  Procedures  . VITAMIN D 25 Hydroxy (Vit-D Deficiency, Fractures)  . CMP14+EGFR  . CBC   Meds ordered this encounter  Medications  . DEPAKOTE 500 MG DR tablet    Sig: TAKE 2 TABLETS BY MOUTH 2 TIMES A DAY    Dispense:  120 tablet    Refill:  3  . pantoprazole (PROTONIX) 40 MG tablet    Sig: Take 1 tablet (40 mg total) by mouth daily.    Dispense:  90 tablet    Refill:  1  . TEGRETOL 200 MG tablet    Sig: TAKE 1 & 1/2 (ONE  & ONE-HALF) TABLETS BY MOUTH IN THE MORNING AND 1 WITH LUNCH AND 1 & 1/2 (ONE & ONE-HALF) IN THE EVENING    Dispense:  120 tablet    Refill:  0    Please consider 90 day supplies to promote better adherence  . fluticasone (FLONASE) 50 MCG/ACT nasal spray    Sig: Place 2 sprays into both nostrils daily.    Dispense:  16 g    Refill:  [place], [place] [phone]

## 2018-12-12 NOTE — Addendum Note (Signed)
Addended byCarrolyn Leigh on: 12/12/2018 11:49 AM   Modules accepted: Orders

## 2018-12-12 NOTE — Patient Instructions (Addendum)
Several of the medications that are prescribed for weight loss are contraindicated or not recommended and seizure disorder.  I would recommend pursuing diet modification and increase physical activity in efforts to obtain weight goals.  I am giving you information on a weight loss doctor in [place] who can also help with non-medicinal weight loss.   Mediterranean Diet A Mediterranean diet refers to food and lifestyle choices that are based on the traditions of countries located on the [employer]. This way of eating has been shown to help prevent certain conditions and improve outcomes for people who have chronic diseases, like kidney disease and heart disease. What are tips for following this plan? Lifestyle  Cook and eat meals together with your family, when possible.  Drink enough fluid to keep your urine clear or pale yellow.  Be physically active every day. This includes: ? Aerobic exercise like running or swimming. ? Leisure activities like gardening, walking, or housework.  Get 7-8 hours of sleep each night.  If recommended by your health care provider, drink red wine in moderation. This means 1 glass a day for nonpregnant women and 2 glasses a day for men. A glass of wine equals 5 oz (150 mL). Reading food labels   Check the serving size of packaged foods. For foods such as rice and pasta, the serving size refers to the amount of cooked product, not dry.  Check the total fat in packaged foods. Avoid foods that have saturated fat or trans fats.  Check the ingredients list for added sugars, such as corn syrup. Shopping  At the grocery store, buy most of your food from the areas near the walls of the store. This includes: ? Fresh fruits and vegetables (produce). ? Grains, beans, nuts, and seeds. Some of these may be available in unpackaged forms or large amounts (in bulk). ? Fresh seafood. ? Poultry and eggs. ? Low-fat dairy products.  Buy whole ingredients instead  of prepackaged foods.  Buy fresh fruits and vegetables in-season from local farmers markets.  Buy frozen fruits and vegetables in resealable bags.  If you do not have access to quality fresh seafood, buy precooked frozen shrimp or canned fish, such as tuna, salmon, or sardines.  Buy small amounts of raw or cooked vegetables, salads, or olives from the deli or salad bar at your store.  Stock your pantry so you always have certain foods on hand, such as olive oil, canned tuna, canned tomatoes, rice, pasta, and beans. Cooking  Cook foods with extra-virgin olive oil instead of using butter or other vegetable oils.  Have meat as a side dish, and have vegetables or grains as your main dish. This means having meat in small portions or adding small amounts of meat to foods like pasta or stew.  Use beans or vegetables instead of meat in common dishes like chili or lasagna.  Experiment with different cooking methods. Try roasting or broiling vegetables instead of steaming or sauteing them.  Add frozen vegetables to soups, stews, pasta, or rice.  Add nuts or seeds for added healthy fat at each meal. You can add these to yogurt, salads, or vegetable dishes.  Marinate fish or vegetables using olive oil, lemon juice, garlic, and fresh herbs. Meal planning   Plan to eat 1 vegetarian meal one day each week. Try to work up to 2 vegetarian meals, if possible.  Eat seafood 2 or more times a week.  Have healthy snacks readily available, such as: ? Vegetable sticks with  hummus. ? [place] yogurt. ? Fruit and nut trail mix.  Eat balanced meals throughout the week. This includes: ? Fruit: 2-3 servings a day ? Vegetables: 4-5 servings a day ? Low-fat dairy: 2 servings a day ? Fish, poultry, or lean meat: 1 serving a day ? Beans and legumes: 2 or more servings a week ? Nuts and seeds: 1-2 servings a day ? Whole grains: 6-8 servings a day ? Extra-virgin olive oil: 3-4 servings a day  Limit red  meat and sweets to only a few servings a month What are my food choices?  Mediterranean diet ? Recommended ? Grains: Whole-grain pasta. Brown rice. Bulgar wheat. Polenta. Couscous. Whole-wheat bread. [place]. ? Vegetables: Artichokes. Beets. Broccoli. Cabbage. Carrots. Eggplant. Green beans. Chard. Kale. Spinach. Onions. Leeks. Peas. Squash. Tomatoes. Peppers. Radishes. ? Fruits: Apples. Apricots. Avocado. Berries. Bananas. Cherries. Dates. Figs. Grapes. Lemons. Melon. Oranges. Peaches. Plums. Pomegranate. ? Meats and other protein foods: Beans. Almonds. Sunflower seeds. Pine nuts. Peanuts. [place]. Salmon. Scallops. Shrimp. [place]. Tilapia. Clams. Oysters. Eggs. ? Dairy: Low-fat milk. Cheese. Greek yogurt. ? Beverages: Water. Red wine. Herbal tea. ? Fats and oils: Extra virgin olive oil. Avocado oil. Grape seed oil. ? Sweets and desserts: [place] yogurt with honey. Baked apples. Poached pears. Trail mix. ? Seasoning and other foods: Basil. Cilantro. Coriander. Cumin. Mint. Parsley. Sage. Rosemary. Tarragon. Garlic. Oregano. Thyme. Pepper. Balsalmic vinegar. Tahini. Hummus. Tomato sauce. Olives. Mushrooms. ? Limit these ? Grains: Prepackaged pasta or rice dishes. Prepackaged cereal with added sugar. ? Vegetables: Deep fried potatoes (french fries). ? Fruits: Fruit canned in syrup. ? Meats and other protein foods: Beef. Pork. Lamb. Poultry with skin. Hot dogs. [place] Salines. ? Dairy: Ice cream. Sour cream. Whole milk. ? Beverages: Juice. Sugar-sweetened soft drinks. Beer. Liquor and spirits. ? Fats and oils: Butter. Canola oil. Vegetable oil. Beef fat (tallow). Lard. ? Sweets and desserts: Cookies. Cakes. Pies. Candy. ? Seasoning and other foods: Mayonnaise. Premade sauces and marinades. ? The items listed may not be a complete list. Talk with your dietitian about what dietary choices are right for you. Summary  The Mediterranean diet includes both food and lifestyle choices.  Eat a variety of  fresh fruits and vegetables, beans, nuts, seeds, and whole grains.  Limit the amount of red meat and sweets that you eat.  Talk with your health care provider about whether it is safe for you to drink red wine in moderation. This means 1 glass a day for nonpregnant women and 2 glasses a day for men. A glass of wine equals 5 oz (150 mL). This information is not intended to replace advice given to you by your health care provider. Make sure you discuss any questions you have with your health care provider. Document Released: 05/26/2016 Document Revised: 06/28/2016 Document Reviewed: 05/26/2016 Elsevier Interactive Patient Education  2019 [employer].

## 2018-12-13 ENCOUNTER — Telehealth: Payer: Self-pay | Admitting: Family Medicine

## 2018-12-13 LAB — CMP14+EGFR
A/G RATIO: 2.4 — AB (ref 1.2–2.2)
ALBUMIN: 4.6 g/dL (ref 3.8–4.9)
ALT: 10 IU/L (ref 0–32)
AST: 21 IU/L (ref 0–40)
Alkaline Phosphatase: 79 IU/L (ref 39–117)
BUN / CREAT RATIO: 18 (ref 9–23)
BUN: 10 mg/dL (ref 6–24)
Bilirubin Total: <0.2 mg/dL (ref 0.0–1.2)
CO2: 20 mmol/L (ref 20–29)
Calcium: 9.2 mg/dL (ref 8.7–10.2)
Chloride: 95 mmol/L — ABNORMAL LOW (ref 96–106)
Creatinine, Ser: 0.55 mg/dL — ABNORMAL LOW (ref 0.57–1.00)
GFR calc Af Amer: 120 mL/min/{1.73_m2} (ref >59)
GFR, EST NON AFRICAN AMERICAN: 104 mL/min/{1.73_m2} (ref >59)
GLOBULIN, TOTAL: 1.9 g/dL (ref 1.5–4.5)
Glucose: 96 mg/dL (ref 65–99)
POTASSIUM: 4.4 mmol/L (ref 3.5–5.2)
SODIUM: 130 mmol/L — AB (ref 134–144)
Total Protein: 6.5 g/dL (ref 6.0–8.5)

## 2018-12-13 LAB — VITAMIN D 25 HYDROXY (VIT D DEFICIENCY, FRACTURES): Vit D, 25-Hydroxy: 44.4 ng/mL (ref 30.0–100.0)

## 2018-12-13 LAB — CBC
Hematocrit: 37.3 % (ref 34.0–46.6)
Hemoglobin: 12.9 g/dL (ref 11.1–15.9)
MCH: 33.9 pg — AB (ref 26.6–33.0)
MCHC: 34.6 g/dL (ref 31.5–35.7)
MCV: 98 fL — ABNORMAL HIGH (ref 79–97)
PLATELETS: 255 10*3/uL (ref 150–450)
RBC: 3.81 x10E6/uL (ref 3.77–5.28)
RDW: 12.5 % (ref 11.7–15.4)
WBC: 4.3 10*3/uL (ref 3.4–10.8)

## 2018-12-13 NOTE — Telephone Encounter (Signed)
Lmtcb/ww 02/27

## 2018-12-13 NOTE — Telephone Encounter (Signed)
Patient is complaining with sinus, headache, runny nose, cough, chest congestion. Patient is requesting a antibiotic she states that she is leaving to go out of town tomorrow.

## 2018-12-14 ENCOUNTER — Other Ambulatory Visit: Payer: Self-pay | Admitting: Family Medicine

## 2018-12-14 MED ORDER — AMOXICILLIN 875 MG PO TABS: 875.0000 mg | ORAL_TABLET | Freq: Two times a day (BID) | ORAL | 0 refills | Status: DC

## 2019-01-11 ENCOUNTER — Other Ambulatory Visit: Payer: Self-pay | Admitting: Family Medicine

## 2019-01-11 DIAGNOSIS — G40909 Epilepsy, unspecified, not intractable, without status epilepticus: Secondary | ICD-10-CM

## 2019-03-14 ENCOUNTER — Other Ambulatory Visit: Payer: Self-pay

## 2019-03-14 ENCOUNTER — Telehealth: Payer: Self-pay | Admitting: *Deleted

## 2019-03-14 ENCOUNTER — Ambulatory Visit (INDEPENDENT_AMBULATORY_CARE_PROVIDER_SITE_OTHER): Payer: Medicaid Other | Admitting: Family Medicine

## 2019-03-14 DIAGNOSIS — M810 Age-related osteoporosis without current pathological fracture: Secondary | ICD-10-CM

## 2019-03-14 DIAGNOSIS — G40909 Epilepsy, unspecified, not intractable, without status epilepticus: Secondary | ICD-10-CM

## 2019-03-14 DIAGNOSIS — R481 Agnosia: Secondary | ICD-10-CM

## 2019-03-14 DIAGNOSIS — K219 Gastro-esophageal reflux disease without esophagitis: Secondary | ICD-10-CM | POA: Diagnosis not present

## 2019-03-14 MED ORDER — DENOSUMAB 60 MG/ML ~~LOC~~ SOSY: 60.0000 mg | PREFILLED_SYRINGE | Freq: Once | SUBCUTANEOUS | 1 refills | Status: AC

## 2019-03-14 MED ORDER — ZONISAMIDE 100 MG PO CAPS: ORAL_CAPSULE | ORAL | 0 refills | Status: DC

## 2019-03-14 MED ORDER — TEGRETOL 200 MG PO TABS: ORAL_TABLET | ORAL | 0 refills | Status: DC

## 2019-03-14 MED ORDER — DEPAKOTE 500 MG PO TBEC: DELAYED_RELEASE_TABLET | ORAL | 3 refills | Status: DC

## 2019-03-14 MED ORDER — PANTOPRAZOLE SODIUM 40 MG PO TBEC: 40.0000 mg | DELAYED_RELEASE_TABLET | Freq: Every day | ORAL | 3 refills | Status: DC

## 2019-03-14 NOTE — Telephone Encounter (Signed)
PA for Prolia obtained from [facility] # [id number] thru 03/13/2020 Per sabrina with [employer]

## 2019-03-14 NOTE — Telephone Encounter (Signed)
Pt notified of PA for Prolia Cost to pt per [facility] is $3 Pt will pick up RX and bring in for injection

## 2019-03-14 NOTE — Progress Notes (Signed)
PA obtained  RX sent to [facility] Pt notified

## 2019-03-14 NOTE — Progress Notes (Signed)
Telephone visit  Subjective: CC: f/u seizure disorder PCP: Janora Norlander, DO QVZ:DGLOV Savannah Shaw is a 58 y.o. female calls for telephone consult today. Patient provides verbal consent for consult held via phone.  Location of patient: home Location of provider: [facility] Others present for call: none  1. Seizure disorder Patient reports stability of seizure disorder.  She denies any grand mal seizures.  She reports compliance with triple therapy anti-seizure medication.  She is running out of her zonisamide soon and certainly needs a refill.  2.  Osteoporosis/GERD Patient reports ongoing GERD.  She has had some loss of taste and increased diarrhea with the Fosamax.  She is wondering if we can pursue alternative therapies for her osteoporosis.  She is compliant with her PPI.  No nausea, vomiting.    ROS: Per HPI  Allergies  Allergen Reactions  . Terbinafine Hcl Rash    Lamasil  . Topamax [Topiramate] Rash   Past Medical History:  Diagnosis Date  . GERD (gastroesophageal reflux disease)   . 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 History of acute pyelonephritis    w/ urosepsis 12-31-2015  resolved  . History of kidney stones   . Hyperlipidemia   . Left ureteral stone   . Normal exercise tolerance test results    12-24-2015 (dr hochrein)  no chest pain, no signficant arrhythmias , 85% max heartrate achieved    Current Outpatient Medications:  .  alendronate (FOSAMAX) 70 MG tablet, Take 1 tablet (70 mg total) by mouth every 7 (seven) days. Take with a full glass of water on an empty stomach. (Patient not taking: Reported on 12/12/2018), Disp: 4 tablet, Rfl: 11 .  amoxicillin (AMOXIL) 875 MG tablet, Take 1 tablet (875 mg total) by mouth 2 (two) times daily., Disp: 20 tablet, Rfl: 0 .  Cholecalciferol 1.25 MG (50000 UT) capsule, Take 1 capsule (50,000 Units total) by mouth every 7 (seven) days., Disp: 12  capsule, Rfl: 0 .  DEPAKOTE 500 MG DR tablet, TAKE 2 TABLETS BY MOUTH 2 TIMES A DAY, Disp: 120 tablet, Rfl: 3 .  fish oil-omega-3 fatty acids 1000 MG capsule, Take 1 capsule by mouth daily.  , Disp: , Rfl:  .  fluticasone (FLONASE) 50 MCG/ACT nasal spray, Place 2 sprays into both nostrils daily., Disp: 16 g, Rfl: 6 .  pantoprazole (PROTONIX) 40 MG tablet, Take 1 tablet (40 mg total) by mouth daily., Disp: 90 tablet, Rfl: 1 .  TEGRETOL 200 MG tablet, TAKE 1 & 1/2 (ONE & ONE-HALF) TABLETS BY MOUTH IN THE MORNING AND 1 WITH LUNCH AND 1 & 1/2 IN THE EVENING, Disp: 360 tablet, Rfl: 0 .  zonisamide (ZONEGRAN) 100 MG capsule, TAKE 1 CAPSULE BY MOUTH ONCE DAILY, Disp: 90 capsule, Rfl: 0  Assessment/ Plan: 58 y.o. female   1. Seizure disorder ([place]) Controlled.  Will be due for labs at next visit.  Appointment has been scheduled.  She is aware of date and time - zonisamide (ZONEGRAN) 100 MG capsule; TAKE 1 CAPSULE BY MOUTH ONCE DAILY  Dispense: 90 capsule; Refill: 0 - TEGRETOL 200 MG tablet; TAKE 1 & 1/2 (ONE & ONE-HALF) TABLETS BY MOUTH IN THE MORNING AND 1 WITH LUNCH AND 1 & 1/2 IN THE EVENING  Dispense: 360 tablet; Refill: 0 - DEPAKOTE 500 MG DR tablet; TAKE 2 TABLETS BY MOUTH 2 TIMES A DAY  Dispense: 120 tablet; Refill: 3  2.  Age-related osteoporosis without current pathological fracture We will pursue Prolia.  I have sent a message to K about this.  We also discussed that if medication is not covered for some reason we can try an alternative bisphosphonate but I am unsure that she will have resolution of symptoms.  3. Loss of perception for taste  4. Gastroesophageal reflux disease, esophagitis presence not specified - pantoprazole (PROTONIX) 40 MG tablet; Take 1 tablet (40 mg total) by mouth daily.  Dispense: 90 tablet; Refill: 3   Start time: 8:08a End time: 8:18am  Total time spent on patient care (including telephone call/ virtual visit): 16 minutes  [place], [place] [phone]

## 2019-03-14 NOTE — Progress Notes (Signed)
Submitted Pharmacist, community for [employer]

## 2019-03-19 ENCOUNTER — Other Ambulatory Visit: Payer: Self-pay

## 2019-03-19 ENCOUNTER — Ambulatory Visit (INDEPENDENT_AMBULATORY_CARE_PROVIDER_SITE_OTHER): Payer: Medicaid Other | Admitting: *Deleted

## 2019-03-19 DIAGNOSIS — M810 Age-related osteoporosis without current pathological fracture: Secondary | ICD-10-CM

## 2019-03-19 MED ORDER — DENOSUMAB 60 MG/ML ~~LOC~~ SOSY
60.0000 mg | PREFILLED_SYRINGE | SUBCUTANEOUS | Status: AC
  Administered 2019-03-19 – 2019-09-19 (×2): 60 mg via SUBCUTANEOUS

## 2019-03-19 NOTE — Progress Notes (Signed)
Pt given 1st Prolia inj Tolerated well Pt supplied

## 2019-05-03 ENCOUNTER — Telehealth: Payer: Self-pay | Admitting: Family Medicine

## 2019-05-03 ENCOUNTER — Other Ambulatory Visit: Payer: Self-pay

## 2019-05-03 ENCOUNTER — Ambulatory Visit (INDEPENDENT_AMBULATORY_CARE_PROVIDER_SITE_OTHER): Payer: Medicaid Other | Admitting: Physician Assistant

## 2019-05-03 ENCOUNTER — Encounter: Payer: Self-pay | Admitting: Physician Assistant

## 2019-05-03 DIAGNOSIS — N201 Calculus of ureter: Secondary | ICD-10-CM

## 2019-05-03 MED ORDER — HYDROCODONE-ACETAMINOPHEN 7.5-325 MG PO TABS: 1.0000 | ORAL_TABLET | Freq: Four times a day (QID) | ORAL | 0 refills | Status: DC | PRN

## 2019-05-03 MED ORDER — TAMSULOSIN HCL 0.4 MG PO CAPS: 0.8000 mg | ORAL_CAPSULE | Freq: Every day | ORAL | 3 refills | Status: DC

## 2019-05-03 MED ORDER — HYDROCODONE-ACETAMINOPHEN 10-325 MG PO TABS: 1.0000 | ORAL_TABLET | ORAL | 0 refills | Status: DC | PRN

## 2019-05-03 MED ORDER — ONDANSETRON 8 MG PO TBDP: 8.0000 mg | ORAL_TABLET | Freq: Three times a day (TID) | ORAL | 0 refills | Status: DC | PRN

## 2019-05-03 NOTE — Addendum Note (Signed)
Addended by: Terald Sleeper on: 05/03/2019 02:41 PM   Modules accepted: Orders

## 2019-05-03 NOTE — Progress Notes (Signed)
1: 04 1:14         Telephone visit  Subjective: CC: Abdominal pain, decreased urine output PCP: Janora Norlander, DO VFI:EPPIR Savannah Shaw is a 58 y.o. female calls for telephone consult today. Patient provides verbal consent for consult held via phone.  Patient is identified with 2 separate identifiers.  At this time the entire area is on COVID-19 social distancing and stay home orders are in place.  Patient is of higher risk and therefore we are performing this by a virtual method.  Location of patient: home Location of provider: [facility] Others present for call: no  This patient has had known kidney stones over the years.  She even had one on her left side that had to have a stent placed for removal.  She was told that she had one in place about a year ago.  She is not had any difficulty since then.  She is also had some diverticulosis.  And so she was wondering if this could be either 1.  Her most pronounced symptoms are left sided pain that goes down into the lower abdomen and across her back.  She is very nauseous and having a lot of pain.  She is having decreased urine output even though she drinks well.  She denies any fever or chills.  She has had looser stools.  But she denies any blood, mucus.    ROS: Per HPI  Allergies  Allergen Reactions  . Terbinafine Hcl Rash    Lamasil  . Topamax [Topiramate] Rash   Past Medical History:  Diagnosis Date  . GERD (gastroesophageal reflux disease)   . 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 History of acute pyelonephritis    w/ urosepsis 12-31-2015  resolved  . History of kidney stones   . Hyperlipidemia   . Left ureteral stone   . Normal exercise tolerance test results    12-24-2015 (dr hochrein)  no chest pain, no signficant arrhythmias , 85% max heartrate achieved    Current Outpatient Medications:  .  alendronate (FOSAMAX) 70 MG tablet, Take 1  tablet (70 mg total) by mouth every 7 (seven) days. Take with a full glass of water on an empty stomach. (Patient not taking: Reported on 12/12/2018), Disp: 4 tablet, Rfl: 11 .  DEPAKOTE 500 MG DR tablet, TAKE 2 TABLETS BY MOUTH 2 TIMES A DAY, Disp: 120 tablet, Rfl: 3 .  fish oil-omega-3 fatty acids 1000 MG capsule, Take 1 capsule by mouth daily.  , Disp: , Rfl:  .  fluticasone (FLONASE) 50 MCG/ACT nasal spray, Place 2 sprays into both nostrils daily., Disp: 16 g, Rfl: 6 .  HYDROcodone-acetaminophen (NORCO) 10-325 MG tablet, Take 1 tablet by mouth every 4 (four) hours as needed for up to 5 days., Disp: 20 tablet, Rfl: 0 .  ondansetron (ZOFRAN ODT) 8 MG disintegrating tablet, Take 1 tablet (8 mg total) by mouth every 8 (eight) hours as needed for nausea or vomiting., Disp: 20 tablet, Rfl: 0 .  pantoprazole (PROTONIX) 40 MG tablet, Take 1 tablet (40 mg total) by mouth daily., Disp: 90 tablet, Rfl: 3 .  tamsulosin (FLOMAX) 0.4 MG CAPS capsule, Take 2 capsules (0.8 mg total) by mouth daily., Disp: 30 capsule, Rfl: 3 .  TEGRETOL 200 MG tablet, TAKE 1 & 1/2 (ONE & ONE-HALF) TABLETS BY MOUTH IN THE MORNING AND 1 WITH LUNCH AND 1 & 1/2 IN THE  EVENING, Disp: 360 tablet, Rfl: 0 .  zonisamide (ZONEGRAN) 100 MG capsule, TAKE 1 CAPSULE BY MOUTH ONCE DAILY, Disp: 90 capsule, Rfl: 0  Current Facility-Administered Medications:  .  denosumab (PROLIA) injection 60 mg, 60 mg, Subcutaneous, Q6 months, Gottschalk, Ashly M, DO, 60 mg at 03/19/19 1118  Assessment/ Plan: 58 y.o. female   1. Ureteral calculi - tamsulosin (FLOMAX) 0.4 MG CAPS capsule; Take 2 capsules (0.8 mg total) by mouth daily.  Dispense: 30 capsule; Refill: 3 - ondansetron (ZOFRAN ODT) 8 MG disintegrating tablet; Take 1 tablet (8 mg total) by mouth every 8 (eight) hours as needed for nausea or vomiting.  Dispense: 20 tablet; Refill: 0 - HYDROcodone-acetaminophen (NORCO) 10-325 MG tablet; Take 1 tablet by mouth every 4 (four) hours as needed for up to 5  days.  Dispense: 20 tablet; Refill: 0  Patient instructed that if anything changes and seems to be more directed toward gastrointestinal symptoms to please call over the weekend.  I will be on the one on call and can help her.  She has also given warning of anything suddenly changes and gets severely unbearable to go to the emergency department.  No follow-ups on file.  Continue all other maintenance medications as listed above.  Start time: 1:04 PM End time: 1:17 PM  Meds ordered this encounter  Medications  . tamsulosin (FLOMAX) 0.4 MG CAPS capsule    Sig: Take 2 capsules (0.8 mg total) by mouth daily.    Dispense:  30 capsule    Refill:  3    Order Specific Question:   Supervising Provider    Answer:   Janora Norlander [[id number]]  . ondansetron (ZOFRAN ODT) 8 MG disintegrating tablet    Sig: Take 1 tablet (8 mg total) by mouth every 8 (eight) hours as needed for nausea or vomiting.    Dispense:  20 tablet    Refill:  0    Order Specific Question:   Supervising Provider    Answer:   Janora Norlander [[id number]]  . HYDROcodone-acetaminophen (NORCO) 10-325 MG tablet    Sig: Take 1 tablet by mouth every 4 (four) hours as needed for up to 5 days.    Dispense:  20 tablet    Refill:  0    Order Specific Question:   Supervising Provider    Answer:   Janora Norlander [[id number]]    Particia Nearing PA-C [place] [phone]

## 2019-06-11 ENCOUNTER — Other Ambulatory Visit: Payer: Self-pay

## 2019-06-12 ENCOUNTER — Ambulatory Visit: Payer: Medicaid Other | Admitting: Family Medicine

## 2019-06-12 ENCOUNTER — Encounter: Payer: Self-pay | Admitting: Family Medicine

## 2019-06-12 VITALS — BP 130/84 | HR 70 | Temp 97.8°F | Ht 65.0 in | Wt 159.0 lb

## 2019-06-12 DIAGNOSIS — F329 Major depressive disorder, single episode, unspecified: Secondary | ICD-10-CM | POA: Diagnosis not present

## 2019-06-12 DIAGNOSIS — G40909 Epilepsy, unspecified, not intractable, without status epilepticus: Secondary | ICD-10-CM

## 2019-06-12 DIAGNOSIS — K219 Gastro-esophageal reflux disease without esophagitis: Secondary | ICD-10-CM

## 2019-06-12 DIAGNOSIS — Z23 Encounter for immunization: Secondary | ICD-10-CM

## 2019-06-12 DIAGNOSIS — F439 Reaction to severe stress, unspecified: Secondary | ICD-10-CM

## 2019-06-12 MED ORDER — DEPAKOTE 500 MG PO TBEC: DELAYED_RELEASE_TABLET | ORAL | 3 refills | Status: DC

## 2019-06-12 MED ORDER — HYDROXYZINE HCL 25 MG PO TABS: 12.5000 mg | ORAL_TABLET | Freq: Three times a day (TID) | ORAL | 3 refills | Status: DC | PRN

## 2019-06-12 MED ORDER — FLUOXETINE HCL 20 MG PO TABS: 20.0000 mg | ORAL_TABLET | Freq: Every day | ORAL | 3 refills | Status: DC

## 2019-06-12 MED ORDER — TEGRETOL 200 MG PO TABS: ORAL_TABLET | ORAL | 0 refills | Status: DC

## 2019-06-12 MED ORDER — ZONISAMIDE 100 MG PO CAPS: ORAL_CAPSULE | ORAL | 0 refills | Status: DC

## 2019-06-12 NOTE — Progress Notes (Signed)
Subjective: CC: f/u seizure disorder PCP: Janora Norlander, DO VZD:GLOVF Savannah Shaw is a 58 y.o. female presenting to clinic today for:  1. Seizure disorder Continues to do well on Depakote, Zonegran and Tegretol.  No major seizure activity.  2.  Anxiety/depression Patient reports that she has had increased anxiety and depression since COVID-19 outbreak.  She is been trying to self cope but notes that she is starting to become very stressed and not able to manage independently.  She was previously on Atarax for as needed panic and would like to go back on this and perhaps something else to maintain.  She identifies the pressures by her family as 1 of the main causes of her symptoms.  No SI, HI.  She is treated with Depakote as above for seizure disorder.  3.  GERD Patient reports ongoing GERD despite use of Protonix daily.  She often will have to take Tums on top of it for acid behind her breast plate.  She denies any vomiting.   ROS: Per HPI  Allergies  Allergen Reactions  . Terbinafine Hcl Rash    Lamasil  . Topamax [Topiramate] Rash   Past Medical History:  Diagnosis Date  . GERD (gastroesophageal reflux disease)   . 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 History of acute pyelonephritis    w/ urosepsis 12-31-2015  resolved  . History of kidney stones   . Hyperlipidemia   . Left ureteral stone   . Normal exercise tolerance test results    12-24-2015 (dr hochrein)  no chest pain, no signficant arrhythmias , 85% max heartrate achieved    Current Outpatient Medications:  .  alendronate (FOSAMAX) 70 MG tablet, Take 1 tablet (70 mg total) by mouth every 7 (seven) days. Take with a full glass of water on an empty stomach. (Patient not taking: Reported on 12/12/2018), Disp: 4 tablet, Rfl: 11 .  DEPAKOTE 500 MG DR tablet, TAKE 2 TABLETS BY MOUTH 2 TIMES A DAY, Disp: 120 tablet, Rfl: 3 .  fish oil-omega-3  fatty acids 1000 MG capsule, Take 1 capsule by mouth daily.  , Disp: , Rfl:  .  fluticasone (FLONASE) 50 MCG/ACT nasal spray, Place 2 sprays into both nostrils daily., Disp: 16 g, Rfl: 6 .  HYDROcodone-acetaminophen (NORCO) 7.5-325 MG tablet, Take 1 tablet by mouth every 6 (six) hours as needed for moderate pain (KIDNEY STONE)., Disp: 20 tablet, Rfl: 0 .  ondansetron (ZOFRAN ODT) 8 MG disintegrating tablet, Take 1 tablet (8 mg total) by mouth every 8 (eight) hours as needed for nausea or vomiting., Disp: 20 tablet, Rfl: 0 .  pantoprazole (PROTONIX) 40 MG tablet, Take 1 tablet (40 mg total) by mouth daily., Disp: 90 tablet, Rfl: 3 .  tamsulosin (FLOMAX) 0.4 MG CAPS capsule, Take 2 capsules (0.8 mg total) by mouth daily., Disp: 30 capsule, Rfl: 3 .  TEGRETOL 200 MG tablet, TAKE 1 & 1/2 (ONE & ONE-HALF) TABLETS BY MOUTH IN THE MORNING AND 1 WITH LUNCH AND 1 & 1/2 IN THE EVENING, Disp: 360 tablet, Rfl: 0 .  zonisamide (ZONEGRAN) 100 MG capsule, TAKE 1 CAPSULE BY MOUTH ONCE DAILY, Disp: 90 capsule, Rfl: 0  Current Facility-Administered Medications:  .  denosumab (PROLIA) injection 60 mg, 60 mg, Subcutaneous, Q6 months, [place] M, DO, 60 mg at 03/19/19 1118 Social History   Socioeconomic History  . Marital status: Single  Spouse name: Not on file  . Number of children: Not on file  . Years of education: Not on file  . Highest education level: Not on file  Occupational History  . Occupation: Disabled    Comment: Seizures  Social Needs  . Financial resource strain: Not on file  . Food insecurity    Worry: Not on file    Inability: Not on file  . Transportation needs    Medical: Not on file    Non-medical: Not on file  Tobacco Use  . Smoking status: Former Smoker    Packs/day: 1.00    Years: 5.00    Pack years: 5.00    Types: Cigarettes    Quit date: 01/11/2011    Years since quitting: 8.4  . Smokeless tobacco: Never Used  Substance and Sexual Activity  . Alcohol use: No  .  Drug use: No  . Sexual activity: Not on file  Lifestyle  . Physical activity    Days per week: Not on file    Minutes per session: Not on file  . Stress: Not on file  Relationships  . Social Herbalist on phone: Not on file    Gets together: Not on file    Attends religious service: Not on file    Active member of club or organization: Not on file    Attends meetings of clubs or organizations: Not on file    Relationship status: Not on file  . Intimate partner violence    Fear of current or ex partner: Not on file    Emotionally abused: Not on file    Physically abused: Not on file    Forced sexual activity: Not on file  Other Topics Concern  . Not on file  Social History Narrative   Daily Caffeine use - 3   Family History  Problem Relation Age of Onset  . Diabetes Mother   . Heart disease Mother 55       No details about the type of heart disease.    . Prostate cancer Other   . Colon cancer Neg Hx     Objective: Office vital signs reviewed. BP 130/84   Pulse 70   Temp 97.8 F (36.6 C) (Oral)   Ht _0  (1.651 m)   Wt 159 lb (72.1 kg)   BMI 26.46 kg/m   Physical Examination:  General: Awake, alert, well nourished, No acute distress HEENT: Normal, EOMI, PERRL Cardio: regular rate and rhythm, S1S2 heard, no murmurs appreciated Pulm: clear to auscultation bilaterally, no wheezes, rhonchi or rales; normal work of breathing on room air Extremities: warm, well perfused, No edema, cyanosis or clubbing; +2 pulses bilaterally MSK: normal gait and station Psych: Mood stable, speech normal, affect appropriate, good eye contact. Depression screen [facility] 2/9 06/12/2019 12/12/2018 09/11/2018  Decreased Interest 2 0 1  Down, Depressed, Hopeless 3 0 2  PHQ - 2 Score 5 0 3  Altered sleeping _1 Tired, decreased energy _2 Change in appetite 1 0 1  Feeling bad or failure about yourself  1 0 1  Trouble concentrating 0 0 0  Moving slowly or fidgety/restless 0 0 1   Suicidal thoughts 0 0 0  PHQ-9 Score _3 Difficult doing work/chores Somewhat difficult - Somewhat difficult  Some recent data might be hidden   GAD 7 : Generalized Anxiety Score 06/12/2019  Nervous, Anxious, on Edge 1  Control/stop worrying 1  Worry too much - different things 1  Trouble relaxing 1  Restless 0  Easily annoyed or irritable 1  Afraid - awful might happen 1  Total GAD 7 Score 6  Anxiety Difficulty Somewhat difficult    Assessment/ Plan: 58 y.o. female   1. Seizure disorder ([place]) Stable.  Check CBC and CMP.  Medications refilled - CMP14+EGFR - CBC - TEGRETOL 200 MG tablet; TAKE 1 & 1/2 (ONE & ONE-HALF) TABLETS BY MOUTH IN THE MORNING AND 1 WITH LUNCH AND 1 & 1/2 IN THE EVENING  Dispense: 360 tablet; Refill: 0 - zonisamide (ZONEGRAN) 100 MG capsule; TAKE 1 CAPSULE BY MOUTH ONCE DAILY  Dispense: 90 capsule; Refill: 0 - DEPAKOTE 500 MG DR tablet; TAKE 2 TABLETS BY MOUTH 2 TIMES A DAY  Dispense: 120 tablet; Refill: 3  2. Reactive depression Start Prozac 20 mg daily.  Caution possible GI side effects. - FLUoxetine (PROZAC) 20 MG tablet; Take 1 tablet (20 mg total) by mouth daily.  Dispense: 30 tablet; Refill: 3  3. Stress at home Atarax provided for as needed use.  Hopefully the Prozac will control symptoms - hydrOXYzine (ATARAX/VISTARIL) 25 MG tablet; Take 0.5-1 tablets (12.5-25 mg total) by mouth every 8 (eight) hours as needed for anxiety.  Dispense: 30 tablet; Refill: 3  4. Gastroesophageal reflux disease without esophagitis Continue Protonix.  Okay to continue Tums, particularly given history of osteoporosis.  We discussed that if having to use Tums daily or multiple times per day low threshold to have eval by GI.  She will contact me if this is needed.  5. Need for shingles vaccine Administered today.    Orders Placed This Encounter  Procedures  . CMP14+EGFR  . CBC   No orders of the defined types were placed in this encounter.    Janora Norlander, DO [facility] [phone]

## 2019-06-12 NOTE — Patient Instructions (Signed)
Fluoxetine: daily medication to help with depression/ anxiety Hydroxyzine: to be used IF NEEDED for breakthrough anxiety/ panic.  Taking the medicine as directed and not missing any doses is one of the best things you can do to treat your depression.  Here are some things to keep in mind:  1) Side effects (stomach upset, some increased anxiety) may happen before you notice a benefit.  These side effects typically go away over time. 2) Changes to your dose of medicine or a change in medication all together is sometimes necessary 3) Most people need to be on medication at least 12 months 4) Many people will notice an improvement within two weeks but the full effect of the medication can take up to 4-6 weeks 5) Stopping the medication when you start feeling better often results in a return of symptoms 6) Never discontinue your medication without contacting a health care professional first.  Some medications require gradual discontinuation/ taper and can make you sick if you stop them abruptly.  If your symptoms worsen or you have thoughts of suicide/homicide, PLEASE SEEK IMMEDIATE MEDICAL ATTENTION.  You may always call:  National Suicide Hotline: (443)602-4099 [place]: 239-822-8509 Crisis Recovery in [place]: (450)693-3762   These are available 24 hours a day, 7 days a week.

## 2019-06-13 LAB — CMP14+EGFR
ALT: 12 IU/L (ref 0–32)
AST: 18 IU/L (ref 0–40)
Albumin/Globulin Ratio: 2.1 (ref 1.2–2.2)
Albumin: 4.7 g/dL (ref 3.8–4.9)
Alkaline Phosphatase: 36 IU/L — ABNORMAL LOW (ref 39–117)
BUN/Creatinine Ratio: 19 (ref 9–23)
BUN: 11 mg/dL (ref 6–24)
Bilirubin Total: <0.2 mg/dL (ref 0.0–1.2)
CO2: 22 mmol/L (ref 20–29)
Calcium: 8.9 mg/dL (ref 8.7–10.2)
Chloride: 103 mmol/L (ref 96–106)
Creatinine, Ser: 0.57 mg/dL (ref 0.57–1.00)
GFR calc Af Amer: 118 mL/min/{1.73_m2} (ref >59)
GFR calc non Af Amer: 103 mL/min/{1.73_m2} (ref >59)
Globulin, Total: 2.2 g/dL (ref 1.5–4.5)
Glucose: 97 mg/dL (ref 65–99)
Potassium: 4.8 mmol/L (ref 3.5–5.2)
Sodium: 139 mmol/L (ref 134–144)
Total Protein: 6.9 g/dL (ref 6.0–8.5)

## 2019-06-13 LAB — CBC
Hematocrit: 39.3 % (ref 34.0–46.6)
Hemoglobin: 13.5 g/dL (ref 11.1–15.9)
MCH: 34.1 pg — ABNORMAL HIGH (ref 26.6–33.0)
MCHC: 34.4 g/dL (ref 31.5–35.7)
MCV: 99 fL — ABNORMAL HIGH (ref 79–97)
Platelets: 266 10*3/uL (ref 150–450)
RBC: 3.96 x10E6/uL (ref 3.77–5.28)
RDW: 12.4 % (ref 11.7–15.4)
WBC: 4.7 10*3/uL (ref 3.4–10.8)

## 2019-07-11 ENCOUNTER — Ambulatory Visit (INDEPENDENT_AMBULATORY_CARE_PROVIDER_SITE_OTHER): Payer: Medicaid Other | Admitting: Family Medicine

## 2019-07-11 ENCOUNTER — Encounter: Payer: Self-pay | Admitting: Family Medicine

## 2019-07-11 DIAGNOSIS — K30 Functional dyspepsia: Secondary | ICD-10-CM | POA: Diagnosis not present

## 2019-07-11 MED ORDER — FAMOTIDINE 20 MG PO TABS: 20.0000 mg | ORAL_TABLET | Freq: Two times a day (BID) | ORAL | 1 refills | Status: DC

## 2019-07-11 NOTE — Progress Notes (Signed)
Virtual Visit via telephone Note  I connected with Savannah Shaw on 07/11/19 at 1407 by telephone and verified that I am speaking with the correct person using two identifiers. Savannah Shaw is currently located at home and no other people are currently with her during visit. The provider, Fransisca Kaufmann , MD is located in their office at time of visit.  Call ended at 1424  I discussed the limitations, risks, security and privacy concerns of performing an evaluation and management service by telephone and the availability of in person appointments. I also discussed with the patient that there may be a patient responsible charge related to this service. The patient expressed understanding and agreed to proceed.   History and Present Illness: Patient stopped taking fluoxetine because she felt like it was causing stomach issues and stopped it 5 days ago and the stomach is not improving. She is complaining of when she eats the food goes right through her. She has belching and gas.  She denies any fevers or chills. This has been going on for the past week and a half.  Even one day she had a coffee and had to use the restroom at the store.  She has diarrhea within a half hour and it is loose. She denies any stomach pains. She did take imodium but does not want to take it all of the time. She had a cholecystectomy. She stays away from fried foods. She has issues with salads sometimes.   No diagnosis found.  Outpatient Encounter Medications as of 07/11/2019  Medication Sig  . cholecalciferol (VITAMIN D3) 25 MCG (1000 UT) tablet Take 2,000 Units by mouth daily.  Marland Kitchen DEPAKOTE 500 MG DR tablet TAKE 2 TABLETS BY MOUTH 2 TIMES A DAY  . fish oil-omega-3 fatty acids 1000 MG capsule Take 1 capsule by mouth daily.    Marland Kitchen FLUoxetine (PROZAC) 20 MG tablet Take 1 tablet (20 mg total) by mouth daily.  . fluticasone (FLONASE) 50 MCG/ACT nasal spray Place 2 sprays into both nostrils daily.  . hydrOXYzine  (ATARAX/VISTARIL) 25 MG tablet Take 0.5-1 tablets (12.5-25 mg total) by mouth every 8 (eight) hours as needed for anxiety.  . ondansetron (ZOFRAN ODT) 8 MG disintegrating tablet Take 1 tablet (8 mg total) by mouth every 8 (eight) hours as needed for nausea or vomiting. (Patient not taking: Reported on 06/12/2019)  . pantoprazole (PROTONIX) 40 MG tablet Take 1 tablet (40 mg total) by mouth daily.  . TEGRETOL 200 MG tablet TAKE 1 & 1/2 (ONE & ONE-HALF) TABLETS BY MOUTH IN THE MORNING AND 1 WITH LUNCH AND 1 & 1/2 IN THE EVENING  . zonisamide (ZONEGRAN) 100 MG capsule TAKE 1 CAPSULE BY MOUTH ONCE DAILY   Facility-Administered Encounter Medications as of 07/11/2019  Medication  . denosumab (PROLIA) injection 60 mg    Review of Systems  Constitutional: Negative for chills and fever.  Eyes: Negative for visual disturbance.  Respiratory: Negative for chest tightness and shortness of breath.   Cardiovascular: Negative for chest pain and leg swelling.  Gastrointestinal: Positive for abdominal distention, diarrhea and nausea. Negative for abdominal pain, constipation and vomiting.  Musculoskeletal: Negative for back pain and gait problem.  Skin: Negative for rash.  Neurological: Negative for light-headedness and headaches.  Psychiatric/Behavioral: Negative for agitation and behavioral problems.  All other systems reviewed and are negative.   Observations/Objective: Patient sounds comfortable and in no acute distress  Assessment and Plan: Problem List Items Addressed This Visit  None    Visit Diagnoses    Acid indigestion    -  Primary   Relevant Medications   famotidine (PEPCID) 20 MG tablet      Sounds like indigestion and will hold fluoxetine and do pepcid for 2 weeks Follow Up Instructions: Follow up in 3-4 weeks with Dr Lajuana Ripple on acid and mood.    I discussed the assessment and treatment plan with the patient. The patient was provided an opportunity to ask questions and all  were answered. The patient agreed with the plan and demonstrated an understanding of the instructions.   The patient was advised to call back or seek an in-person evaluation if the symptoms worsen or if the condition fails to improve as anticipated.  The above assessment and management plan was discussed with the patient. The patient verbalized understanding of and has agreed to the management plan. Patient is aware to call the clinic if symptoms persist or worsen. Patient is aware when to return to the clinic for a follow-up visit. Patient educated on when it is appropriate to go to the emergency department.    I provided 17 minutes of non-face-to-face time during this encounter.    Worthy Rancher, MD

## 2019-07-23 ENCOUNTER — Ambulatory Visit: Payer: Medicaid Other

## 2019-07-25 ENCOUNTER — Ambulatory Visit (INDEPENDENT_AMBULATORY_CARE_PROVIDER_SITE_OTHER): Payer: Medicaid Other | Admitting: Family Medicine

## 2019-07-25 ENCOUNTER — Encounter: Payer: Self-pay | Admitting: Family Medicine

## 2019-07-25 DIAGNOSIS — J069 Acute upper respiratory infection, unspecified: Secondary | ICD-10-CM

## 2019-07-25 DIAGNOSIS — J3489 Other specified disorders of nose and nasal sinuses: Secondary | ICD-10-CM

## 2019-07-25 MED ORDER — AMOXICILLIN-POT CLAVULANATE 875-125 MG PO TABS: 1.0000 | ORAL_TABLET | Freq: Two times a day (BID) | ORAL | 0 refills | Status: AC

## 2019-07-25 MED ORDER — CETIRIZINE HCL 10 MG PO TABS: 10.0000 mg | ORAL_TABLET | Freq: Every day | ORAL | 11 refills | Status: DC

## 2019-07-25 NOTE — Progress Notes (Signed)
Virtual Visit via telephone Note Due to COVID-19 pandemic this visit was conducted virtually. This visit type was conducted due to national recommendations for restrictions regarding the COVID-19 Pandemic (e.g. social distancing, sheltering in place) in an effort to limit this patient's exposure and mitigate transmission in our community. All issues noted in this document were discussed and addressed.  A physical exam was not performed with this format.   I connected with Savannah Shaw on 07/25/19 at 39 by telephone and verified that I am speaking with the correct person using two identifiers. Savannah Shaw is currently located at home and family is currently with them during visit. The provider, Monia Pouch, FNP is located in their office at time of visit.  I discussed the limitations, risks, security and privacy concerns of performing an evaluation and management service by telephone and the availability of in person appointments. I also discussed with the patient that there may be a patient responsible charge related to this service. The patient expressed understanding and agreed to proceed.  Subjective:  Patient ID: Savannah Shaw, female    DOB: 1961/06/12, 58 y.o.   MRN: [id number]  Chief Complaint:  Sinus Problem   HPI: Savannah Shaw is a 58 y.o. female presenting on 07/25/2019 for Sinus Problem   Pt reports ongoing rhinorrhea, frontal sinus pressure, postnasal drip, congestion, headaches, and sputum production for 10 days. She denies measured temperature, chills, weakness, confusion, fatigue, chest pain, shortness of breath, abdominal pain, or diarrhea. She has been using her Flonase for the last several weeks without relief of symptoms. She denies recent travel or sick contacts.   Sinus Problem This is a recurrent problem. The current episode started 1 to 4 weeks ago. The problem has been gradually worsening since onset. There has been no fever. Her pain is at a severity of 4/10. The  pain is mild. Associated symptoms include congestion, coughing, headaches, sinus pressure, sneezing and a sore throat. Pertinent negatives include no chills, diaphoresis, ear pain, hoarse voice, neck pain, shortness of breath or swollen glands. Past treatments include spray decongestants. The treatment provided no relief.     Relevant past medical, surgical, family, and social history reviewed and updated as indicated.  Allergies and medications reviewed and updated.   Past Medical History:  Diagnosis Date  . GERD (gastroesophageal reflux disease)   . 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 History of acute pyelonephritis    w/ urosepsis 12-31-2015  resolved  . History of kidney stones   . Hyperlipidemia   . Left ureteral stone   . Normal exercise tolerance test results    12-24-2015 (dr hochrein)  no chest pain, no signficant arrhythmias , 85% max heartrate achieved    Past Surgical History:  Procedure Laterality Date  . APPENDECTOMY    . CARDIOVASCULAR STRESS TEST  07-20-2011   normal nuclear study/  no ischemia, normal LV function and wall motion, ef 76%  . CHOLECYSTECTOMY OPEN  1970's   w/  Appendectomy  . CYSTOSCOPY W/ URETERAL STENT PLACEMENT Left 12/31/2015   Procedure: CYSTOSCOPY WITH RETROGRADE PYELOGRAM/URETERAL STENT PLACEMENT;  Surgeon: Cleon Gustin, MD;  Location: [facility];  Service: Urology;  Laterality: Left;  . CYSTOSCOPY W/ URETERAL STENT PLACEMENT Left 01/14/2016   Procedure: CYSTOSCOPY WITH STENT REPLACEMENT;  Surgeon: Cleon Gustin, MD;  Location[facility];  Service: Urology;  Laterality: Left;  .  CYSTOSCOPY/RETROGRADE/URETEROSCOPY/STONE EXTRACTION WITH BASKET Left 01/14/2016   Procedure: CYSTOSCOPY/RETROGRADE/URETEROSCOPY/STONE EXTRACTION WITH BASKET;  Surgeon: Cleon Gustin, MD;  Location: [facility];  Service: Urology;  Laterality: Left;  .  HOLMIUM LASER APPLICATION Left [id number]   Procedure: HOLMIUM LASER APPLICATION;  Surgeon: Cleon Gustin, MD;  Location: [facility];  Service: Urology;  Laterality: Left;  . OOPHORECTOMY  1980's   Unilateral oophorectomy and  Bilateral Salpingectomy (tubal)  . TONSILLECTOMY AND ADENOIDECTOMY  as child  . TRANSTHORACIC ECHOCARDIOGRAM  07-20-2011   ef 55-65%/  trivial MR/  mild TR    Social History   Socioeconomic History  . Marital status: Single    Spouse name: Not on file  . Number of children: Not on file  . Years of education: Not on file  . Highest education level: Not on file  Occupational History  . Occupation: Disabled    Comment: Seizures  Social Needs  . Financial resource strain: Not on file  . Food insecurity    Worry: Not on file    Inability: Not on file  . Transportation needs    Medical: Not on file    Non-medical: Not on file  Tobacco Use  . Smoking status: Former Smoker    Packs/day: 1.00    Years: 5.00    Pack years: 5.00    Types: Cigarettes    Quit date: 01/11/2011    Years since quitting: 8.5  . Smokeless tobacco: Never Used  Substance and Sexual Activity  . Alcohol use: No  . Drug use: No  . Sexual activity: Not on file  Lifestyle  . Physical activity    Days per week: Not on file    Minutes per session: Not on file  . Stress: Not on file  Relationships  . Social Herbalist on phone: Not on file    Gets together: Not on file    Attends religious service: Not on file    Active member of club or organization: Not on file    Attends meetings of clubs or organizations: Not on file    Relationship status: Not on file  . Intimate partner violence    Fear of current or ex partner: Not on file    Emotionally abused: Not on file    Physically abused: Not on file    Forced sexual activity: Not on file  Other Topics Concern  . Not on file  Social History Narrative   Daily Caffeine use - 3    Outpatient Encounter  Medications as of 07/25/2019  Medication Sig  . amoxicillin-clavulanate (AUGMENTIN) 875-125 MG tablet Take 1 tablet by mouth 2 (two) times daily for 10 days.  . cetirizine (ZYRTEC) 10 MG tablet Take 1 tablet (10 mg total) by mouth at bedtime.  . cholecalciferol (VITAMIN D3) 25 MCG (1000 UT) tablet Take 2,000 Units by mouth daily.  Marland Kitchen DEPAKOTE 500 MG DR tablet TAKE 2 TABLETS BY MOUTH 2 TIMES A DAY  . famotidine (PEPCID) 20 MG tablet Take 1 tablet (20 mg total) by mouth 2 (two) times daily.  . fish oil-omega-3 fatty acids 1000 MG capsule Take 1 capsule by mouth daily.    Marland Kitchen FLUoxetine (PROZAC) 20 MG capsule Take 20 mg by mouth daily.  . fluticasone (FLONASE) 50 MCG/ACT nasal spray Place 2 sprays into both nostrils daily.  . hydrOXYzine (ATARAX/VISTARIL) 25 MG tablet Take 0.5-1 tablets (12.5-25 mg total) by mouth every 8 (eight) hours as needed for  anxiety.  . pantoprazole (PROTONIX) 40 MG tablet Take 1 tablet (40 mg total) by mouth daily.  . TEGRETOL 200 MG tablet TAKE 1 & 1/2 (ONE & ONE-HALF) TABLETS BY MOUTH IN THE MORNING AND 1 WITH LUNCH AND 1 & 1/2 IN THE EVENING  . zonisamide (ZONEGRAN) 100 MG capsule TAKE 1 CAPSULE BY MOUTH ONCE DAILY  . [DISCONTINUED] ondansetron (ZOFRAN ODT) 8 MG disintegrating tablet Take 1 tablet (8 mg total) by mouth every 8 (eight) hours as needed for nausea or vomiting. (Patient not taking: Reported on 06/12/2019)   Facility-Administered Encounter Medications as of 07/25/2019  Medication  . denosumab (PROLIA) injection 60 mg    Allergies  Allergen Reactions  . Terbinafine Hcl Rash    Lamasil  . Topamax [Topiramate] Rash    Review of Systems  Constitutional: Negative for activity change, appetite change, chills, diaphoresis, fatigue, fever and unexpected weight change.  HENT: Positive for congestion, postnasal drip, rhinorrhea, sinus pressure, sinus pain, sneezing and sore throat. Negative for ear pain, hoarse voice, tinnitus, trouble swallowing and voice change.    Eyes: Negative.  Negative for photophobia, pain, discharge, redness, itching and visual disturbance.  Respiratory: Positive for cough. Negative for chest tightness, shortness of breath and wheezing.   Cardiovascular: Negative for chest pain, palpitations and leg swelling.  Gastrointestinal: Negative for abdominal pain, blood in stool, constipation, diarrhea, nausea and vomiting.  Endocrine: Negative.   Genitourinary: Negative for decreased urine volume, difficulty urinating, dysuria, frequency and urgency.  Musculoskeletal: Negative for arthralgias, myalgias and neck pain.  Skin: Negative.   Allergic/Immunologic: Negative.   Neurological: Positive for headaches. Negative for dizziness, weakness and light-headedness.  Hematological: Negative.   Psychiatric/Behavioral: Negative for confusion, hallucinations, sleep disturbance and suicidal ideas.  All other systems reviewed and are negative.        Observations/Objective: No vital signs or physical exam, this was a telephone or virtual health encounter.  Pt alert and oriented, answers all questions appropriately, and able to speak in full sentences.    Assessment and Plan: Savannah Shaw was seen today for sinus problem.  Diagnoses and all orders for this visit:  URI with cough and congestion Sinus pressure Ongoing sinus pressure, rhinorrhea, postnasal drip, and congestion despite symptomatic care. Will initiate empiric Augmentin. Pt aware to continue symptomatic measures and to start below. Report any new, worsening, or persistent symptoms. Follow up as needed.  -     amoxicillin-clavulanate (AUGMENTIN) 875-125 MG tablet; Take 1 tablet by mouth 2 (two) times daily for 10 days. -     cetirizine (ZYRTEC) 10 MG tablet; Take 1 tablet (10 mg total) by mouth at bedtime.    Follow Up Instructions: Return if symptoms worsen or fail to improve.    I discussed the assessment and treatment plan with the patient. The patient was provided an  opportunity to ask questions and all were answered. The patient agreed with the plan and demonstrated an understanding of the instructions.   The patient was advised to call back or seek an in-person evaluation if the symptoms worsen or if the condition fails to improve as anticipated.  The above assessment and management plan was discussed with the patient. The patient verbalized understanding of and has agreed to the management plan. Patient is aware to call the clinic if they develop any new symptoms or if symptoms persist or worsen. Patient is aware when to return to the clinic for a follow-up visit. Patient educated on when it is appropriate to go to the  emergency department.    I provided 15 minutes of non-face-to-face time during this encounter. The call started at 1020. The call ended at 1035. The other time was used for coordination of care.    Monia Pouch, FNP-C [facility] [address] [place], [place] [address] [phone] 07/25/19

## 2019-07-30 ENCOUNTER — Other Ambulatory Visit: Payer: Self-pay

## 2019-07-30 ENCOUNTER — Ambulatory Visit (INDEPENDENT_AMBULATORY_CARE_PROVIDER_SITE_OTHER): Payer: Medicaid Other | Admitting: Family Medicine

## 2019-07-30 DIAGNOSIS — R4589 Other symptoms and signs involving emotional state: Secondary | ICD-10-CM

## 2019-07-30 DIAGNOSIS — J310 Chronic rhinitis: Secondary | ICD-10-CM | POA: Diagnosis not present

## 2019-07-30 DIAGNOSIS — K30 Functional dyspepsia: Secondary | ICD-10-CM | POA: Diagnosis not present

## 2019-07-30 DIAGNOSIS — J329 Chronic sinusitis, unspecified: Secondary | ICD-10-CM | POA: Diagnosis not present

## 2019-07-30 NOTE — Progress Notes (Signed)
Telephone visit  Subjective: CC: f/u GERD, Mood, sinusitis PCP: Janora Norlander, DO [id number] Savannah Shaw is a 58 y.o. female calls for telephone consult today. Patient provides verbal consent for consult held via phone.  Location of patient: home Location of provider: Working remotely from home Others present for call: none  1.  GERD/ MOOD/ Sinuses Patient reports improvement in diarrheal symptoms and reflux symptoms with the addition of the Pepcid.  She was seen about a month after our last visit in August via telephone visit for GI symptoms.  There was concern that perhaps the SSRI that was started in August may be contributing and that was recommended to be tapered down and off.  Mood is stable.  She is not yet ready to start anything new.  Patient has been taking the Pepcid twice daily in addition to her PPI and this does seem to be helping.  She is having some loose stools due to the Augmentin that she is taking for sinus infection which was started on 07/25/2019.  She denies any fevers.  She continues to have sinus drainage but describes it as clear.  She has a daughter that works in Corporate treasurer and another child that works at [employer].   ROS: Per HPI  Allergies  Allergen Reactions  . Terbinafine Hcl Rash    Lamasil  . Topamax [Topiramate] Rash   Past Medical History:  Diagnosis Date  . GERD (gastroesophageal reflux disease)   . 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 History of acute pyelonephritis    w/ urosepsis 12-31-2015  resolved  . History of kidney stones   . Hyperlipidemia   . Left ureteral stone   . Normal exercise tolerance test results    12-24-2015 (dr hochrein)  no chest pain, no signficant arrhythmias , 85% max heartrate achieved    Current Outpatient Medications:  .  amoxicillin-clavulanate (AUGMENTIN) 875-125 MG tablet, Take 1 tablet by mouth 2 (two) times daily for 10 days., Disp: 20  tablet, Rfl: 0 .  cetirizine (ZYRTEC) 10 MG tablet, Take 1 tablet (10 mg total) by mouth at bedtime., Disp: 30 tablet, Rfl: 11 .  cholecalciferol (VITAMIN D3) 25 MCG (1000 UT) tablet, Take 2,000 Units by mouth daily., Disp: , Rfl:  .  DEPAKOTE 500 MG DR tablet, TAKE 2 TABLETS BY MOUTH 2 TIMES A DAY, Disp: 120 tablet, Rfl: 3 .  famotidine (PEPCID) 20 MG tablet, Take 1 tablet (20 mg total) by mouth 2 (two) times daily., Disp: 60 tablet, Rfl: 1 .  fish oil-omega-3 fatty acids 1000 MG capsule, Take 1 capsule by mouth daily.  , Disp: , Rfl:  .  FLUoxetine (PROZAC) 20 MG capsule, Take 20 mg by mouth daily., Disp: , Rfl:  .  fluticasone (FLONASE) 50 MCG/ACT nasal spray, Place 2 sprays into both nostrils daily., Disp: 16 g, Rfl: 6 .  hydrOXYzine (ATARAX/VISTARIL) 25 MG tablet, Take 0.5-1 tablets (12.5-25 mg total) by mouth every 8 (eight) hours as needed for anxiety., Disp: 30 tablet, Rfl: 3 .  pantoprazole (PROTONIX) 40 MG tablet, Take 1 tablet (40 mg total) by mouth daily., Disp: 90 tablet, Rfl: 3 .  TEGRETOL 200 MG tablet, TAKE 1 & 1/2 (ONE & ONE-HALF) TABLETS BY MOUTH IN THE MORNING AND 1 WITH LUNCH AND 1 & 1/2 IN THE EVENING, Disp: 360 tablet, Rfl: 0 .  zonisamide (ZONEGRAN) 100 MG capsule, TAKE 1 CAPSULE BY MOUTH  ONCE DAILY, Disp: 90 capsule, Rfl: 0  Current Facility-Administered Medications:  .  denosumab (PROLIA) injection 60 mg, 60 mg, Subcutaneous, Q6 months, Navia Lindahl M, DO, 60 mg at 03/19/19 1118  Assessment/ Plan: 58 y.o. female   1. Acid indigestion Continue Pepcid twice daily in addition to PPI as needed.  2. Depressed mood For now, will discontinue the fluoxetine and hold off on the addition of new medicines.  I am still not entirely convinced that the SSRI was the etiology of her symptoms as she was expressing intermittently uncontrolled GERD symptoms during her last visit in August.  Additionally she has since developed a URI.  I question Covid in this patient given  constellation of GI and URI symptoms.  I have recommended at least considering check for COVID-19, particularly since she has a daughter that works in Corporate treasurer.  The test has been ordered and she will follow-up as needed  3. Rhinosinusitis - Novel Coronavirus, NAA ([facility])   Start time: 8:58am End time: 9:12am  Total time spent on patient care (including telephone call/ virtual visit): 19 minutes  [place], [place] [phone]

## 2019-07-30 NOTE — Patient Instructions (Signed)

## 2019-07-31 DIAGNOSIS — Z20828 Contact with and (suspected) exposure to other viral communicable diseases: Secondary | ICD-10-CM | POA: Diagnosis not present

## 2019-08-02 ENCOUNTER — Encounter: Payer: Self-pay | Admitting: Family Medicine

## 2019-08-26 ENCOUNTER — Ambulatory Visit (INDEPENDENT_AMBULATORY_CARE_PROVIDER_SITE_OTHER): Payer: Medicaid Other | Admitting: Family Medicine

## 2019-08-26 DIAGNOSIS — J310 Chronic rhinitis: Secondary | ICD-10-CM | POA: Diagnosis not present

## 2019-08-26 DIAGNOSIS — J329 Chronic sinusitis, unspecified: Secondary | ICD-10-CM

## 2019-08-26 DIAGNOSIS — G40909 Epilepsy, unspecified, not intractable, without status epilepticus: Secondary | ICD-10-CM

## 2019-08-26 DIAGNOSIS — R4589 Other symptoms and signs involving emotional state: Secondary | ICD-10-CM

## 2019-08-26 MED ORDER — ZONISAMIDE 100 MG PO CAPS: ORAL_CAPSULE | ORAL | 0 refills | Status: DC

## 2019-08-26 MED ORDER — TEGRETOL 200 MG PO TABS: ORAL_TABLET | ORAL | 0 refills | Status: DC

## 2019-08-26 MED ORDER — SERTRALINE HCL 50 MG PO TABS: 50.0000 mg | ORAL_TABLET | Freq: Every day | ORAL | 2 refills | Status: DC

## 2019-08-26 NOTE — Progress Notes (Signed)
Telephone visit  Subjective: CC: seizure disorder PCP: Janora Norlander, DO [id number] Savannah Shaw is a 58 y.o. female calls for telephone consult today. Patient provides verbal consent for consult held via phone.  Location of patient: home Location of provider: Working remotely from home Others present for call: none  1.  Seizure disorder Patient reports compliance with her medications.  No seizure activity.  2.  Depressive disorder Ongoing mood swings and irritability.  She notes she had a "bad night" recently.  She unfortunately was intolerant to fluoxetine secondary to severe GI side effects.  She is wanting to try something else for mood.  3.  Rhinosinusitis Patient continues to have clear nasal drainage.  She is using Zyrtec, Flonase.  No fevers, no purulent discharge from the nares.   ROS: Per HPI  Allergies  Allergen Reactions  . Terbinafine Hcl Rash    Lamasil  . Topamax [Topiramate] Rash   Past Medical History:  Diagnosis Date  . GERD (gastroesophageal reflux disease)   . 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 History of acute pyelonephritis    w/ urosepsis 12-31-2015  resolved  . History of kidney stones   . Hyperlipidemia   . Left ureteral stone   . Normal exercise tolerance test results    12-24-2015 (dr hochrein)  no chest pain, no signficant arrhythmias , 85% max heartrate achieved    Current Outpatient Medications:  .  cetirizine (ZYRTEC) 10 MG tablet, Take 1 tablet (10 mg total) by mouth at bedtime., Disp: 30 tablet, Rfl: 11 .  cholecalciferol (VITAMIN D3) 25 MCG (1000 UT) tablet, Take 2,000 Units by mouth daily., Disp: , Rfl:  .  DEPAKOTE 500 MG DR tablet, TAKE 2 TABLETS BY MOUTH 2 TIMES A DAY, Disp: 120 tablet, Rfl: 3 .  famotidine (PEPCID) 20 MG tablet, Take 1 tablet (20 mg total) by mouth 2 (two) times daily., Disp: 60 tablet, Rfl: 1 .  fish oil-omega-3 fatty acids 1000 MG  capsule, Take 1 capsule by mouth daily.  , Disp: , Rfl:  .  fluticasone (FLONASE) 50 MCG/ACT nasal spray, Place 2 sprays into both nostrils daily., Disp: 16 g, Rfl: 6 .  hydrOXYzine (ATARAX/VISTARIL) 25 MG tablet, Take 0.5-1 tablets (12.5-25 mg total) by mouth every 8 (eight) hours as needed for anxiety., Disp: 30 tablet, Rfl: 3 .  pantoprazole (PROTONIX) 40 MG tablet, Take 1 tablet (40 mg total) by mouth daily., Disp: 90 tablet, Rfl: 3 .  TEGRETOL 200 MG tablet, TAKE 1 & 1/2 (ONE & ONE-HALF) TABLETS BY MOUTH IN THE MORNING AND 1 WITH LUNCH AND 1 & 1/2 IN THE EVENING, Disp: 360 tablet, Rfl: 0 .  zonisamide (ZONEGRAN) 100 MG capsule, TAKE 1 CAPSULE BY MOUTH ONCE DAILY, Disp: 90 capsule, Rfl: 0  Current Facility-Administered Medications:  .  denosumab (PROLIA) injection 60 mg, 60 mg, Subcutaneous, Q6 months, Gottschalk, Ashly M, DO, 60 mg at 03/19/19 1118  Assessment/ Plan: 58 y.o. female   1. Seizure disorder ([place]) Stable.  Renewal of medications.  She will come in for CMP and CBC - CMP14+EGFR; Future - CBC; Future - TEGRETOL 200 MG tablet; TAKE 1 & 1/2 (ONE & ONE-HALF) TABLETS BY MOUTH IN THE MORNING AND 1 WITH LUNCH AND 1 & 1/2 IN THE EVENING  Dispense: 360 tablet; Refill: 0 - zonisamide (ZONEGRAN) 100 MG capsule; TAKE 1 CAPSULE BY MOUTH ONCE DAILY  Dispense: 90 capsule;  Refill: 0  2. Depressed mood Intolerant to fluoxetine secondary to GI side effects.  Trial of Zoloft.  May need to consider totally alternative class if she has significant GI side effects with Zoloft.  She will follow-up with me in 1 month - sertraline (ZOLOFT) 50 MG tablet; Take 1 tablet (50 mg total) by mouth daily.  Dispense: 30 tablet; Refill: 2  3. Rhinosinusitis Continue conservative therapies. No evidence of infection    Start time: 8:15am End time: 8:25am  Total time spent on patient care (including telephone call/ virtual visit): 15 minutes  [place], [place] [phone]

## 2019-08-29 ENCOUNTER — Other Ambulatory Visit: Payer: Self-pay

## 2019-08-30 ENCOUNTER — Ambulatory Visit (INDEPENDENT_AMBULATORY_CARE_PROVIDER_SITE_OTHER): Payer: Medicaid Other

## 2019-08-30 DIAGNOSIS — Z23 Encounter for immunization: Secondary | ICD-10-CM

## 2019-08-30 NOTE — Progress Notes (Signed)
Shingrix given to right deltoid.  Patient tolerated well.

## 2019-09-02 ENCOUNTER — Telehealth: Payer: Self-pay | Admitting: Family Medicine

## 2019-09-02 NOTE — Telephone Encounter (Signed)
Pt is concerned about side effects of sertraline 50 mg due to her hx of seizures and taking medications for her seizures. Advised pt that Dr Darnell Level is aware of her history and knows what medications she is taking and wouldn't prescribe anything that would have an adverse reaction with another medication. Pt voiced understanding.

## 2019-09-18 ENCOUNTER — Other Ambulatory Visit: Payer: Self-pay

## 2019-09-19 ENCOUNTER — Telehealth: Payer: Self-pay

## 2019-09-19 ENCOUNTER — Ambulatory Visit: Payer: Medicaid Other

## 2019-09-19 DIAGNOSIS — M810 Age-related osteoporosis without current pathological fracture: Secondary | ICD-10-CM

## 2019-09-19 NOTE — Progress Notes (Signed)
Prolia injection to right arm. Patient tolerated well. Patient supplied medication.

## 2019-09-19 NOTE — Telephone Encounter (Signed)
We do Dexa scan every 2 years. She is due 07/13/20

## 2019-09-19 NOTE — Telephone Encounter (Signed)
Patient had her second Prolia shot today.  She wants to know what the next step is, ie does she need a dexa scan before her next injection, if so how long does she wait to do dexa scan, etc.  Please advise.

## 2019-09-19 NOTE — Telephone Encounter (Signed)
Pt aware.

## 2019-09-21 IMAGING — DX DG ABDOMEN 1V
1 series · 1 of 1 positions shown · non-contrast
Comparison: 06/20/2017

CLINICAL DATA: Back pain

EXAM:
ABDOMEN - 1 VIEW

[abdomen kub]
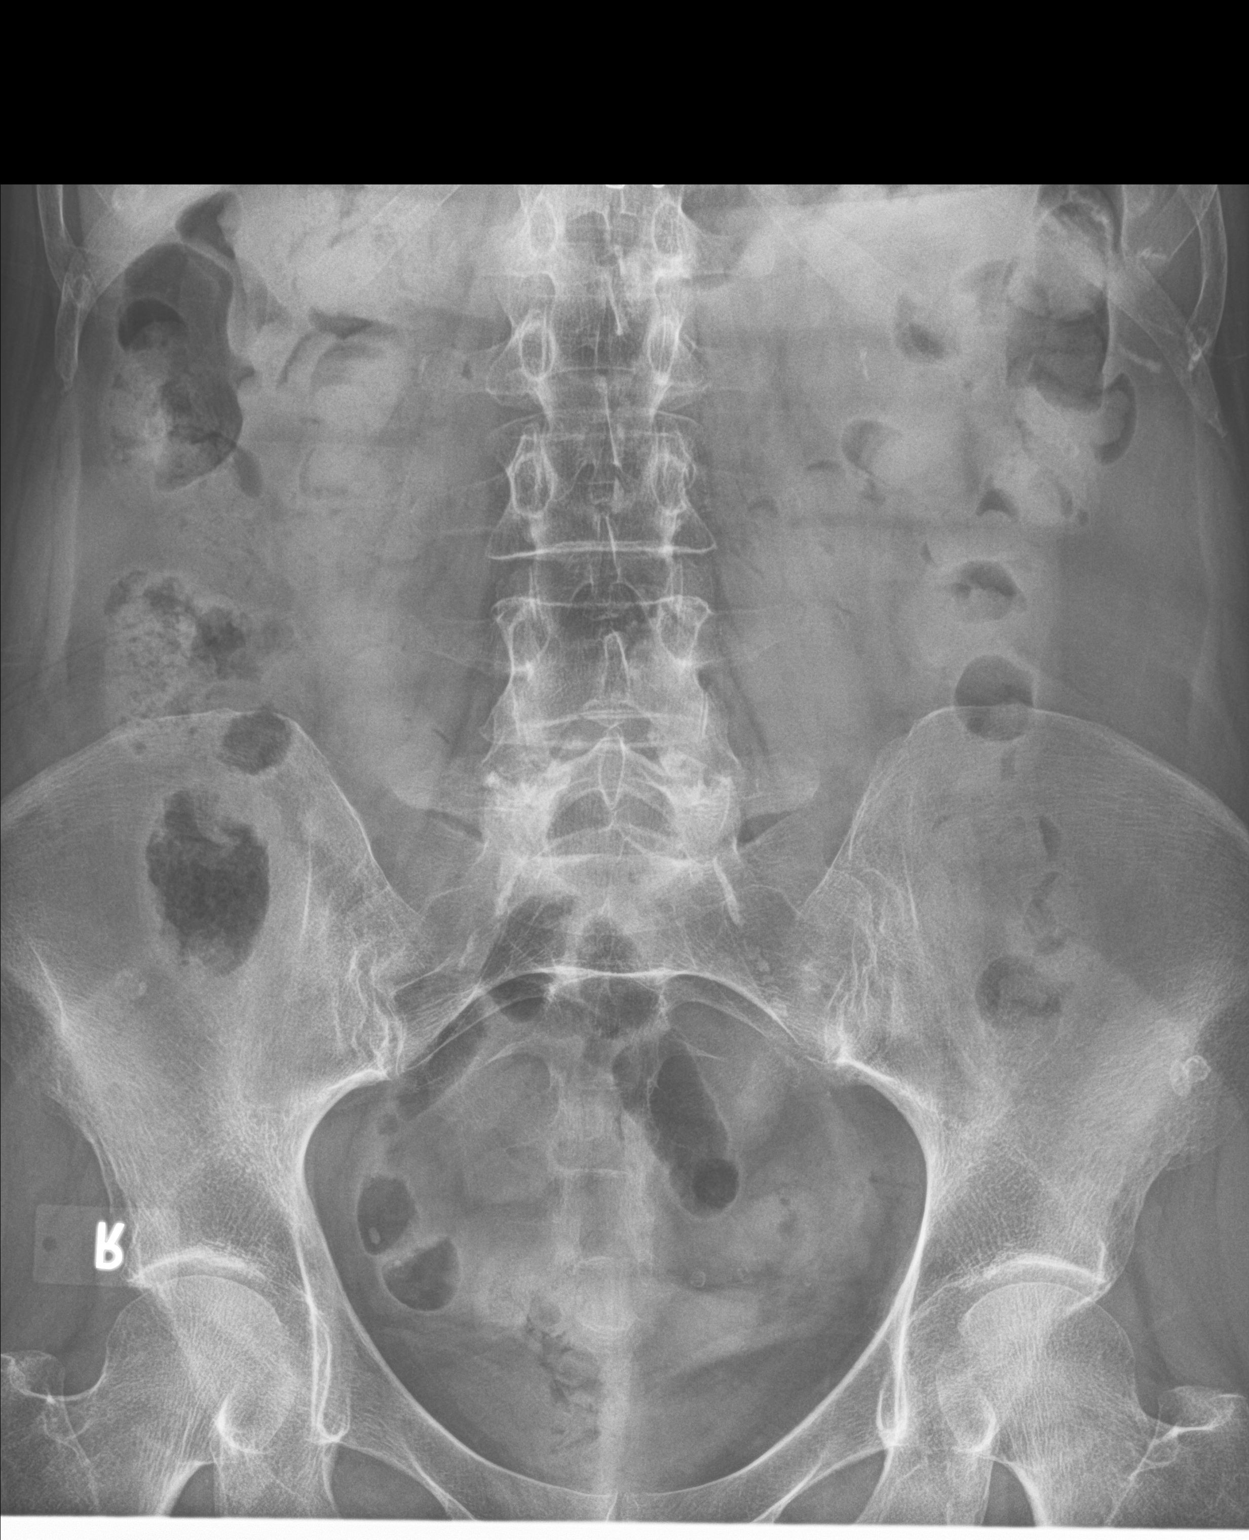

[1 of 1 positions shown; findings below may reference images not displayed]

FINDINGS: There are no disproportionally dilated loops of bowel. Small
calcifications project over the left renal shadow. Phleboliths
project over the pelvis. There is no obvious free intraperitoneal
gas. Vascular calcifications are noted over the iliac arterial
territory.
IMPRESSION: Nonobstructive bowel gas pattern.

Possible left nephrolithiasis.

## 2019-10-14 ENCOUNTER — Telehealth: Payer: Self-pay | Admitting: Family Medicine

## 2019-10-14 NOTE — Telephone Encounter (Signed)
Patient has had IBS that has been bad for about a year now.   She wants to know if there is any medicine that could help with diarrhea that happens when she eats,  Please advise.

## 2019-10-14 NOTE — Telephone Encounter (Signed)
Ok to offer televisit for discussion of options.

## 2019-10-14 NOTE — Telephone Encounter (Signed)
Apt scheduled.  

## 2019-10-15 ENCOUNTER — Ambulatory Visit (INDEPENDENT_AMBULATORY_CARE_PROVIDER_SITE_OTHER): Payer: Medicaid Other | Admitting: Family Medicine

## 2019-10-15 DIAGNOSIS — K580 Irritable bowel syndrome with diarrhea: Secondary | ICD-10-CM

## 2019-10-15 MED ORDER — VIBERZI 100 MG PO TABS: 100.0000 mg | ORAL_TABLET | Freq: Two times a day (BID) | ORAL | 0 refills | Status: DC

## 2019-10-15 NOTE — Progress Notes (Signed)
Telephone visit  Subjective: CC: IBS? PCP: Janora Norlander, DO [id number] Savannah Shaw is a 58 y.o. female calls for telephone consult today. Patient provides verbal consent for consult held via phone.  Due to COVID-19 pandemic this visit was conducted virtually. This visit type was conducted due to national recommendations for restrictions regarding the COVID-19 Pandemic (e.g. social distancing, sheltering in place) in an effort to limit this patient's exposure and mitigate transmission in our community. All issues noted in this document were discussed and addressed.  A physical exam was not performed with this format.   Location of patient: home Location of provider: [facility] Others present for call: none  1. IBS Patient reports that after each meal, she has to use the restroom.  She reports loose stools.  No blood in stool. No nausea, vomiting, unplanned weight loss.  This has been going on for years but seems worse recently.  This interferes with her ability go places and eat out, etc.  She has tried imodium with varies degrees of improvement.  She doesn't see an association with any specific foods. No known family history of crohn's disease, celiac.  She reports her colonoscopy 10 years ago there was a question about crohn's but this was not found on procedure/ biopsy.   ROS: Per HPI  Allergies  Allergen Reactions  . Terbinafine Hcl Rash    Lamasil  . Topamax [Topiramate] Rash   Past Medical History:  Diagnosis Date  . GERD (gastroesophageal reflux disease)   . 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 History of acute pyelonephritis    w/ urosepsis 12-31-2015  resolved  . History of kidney stones   . Hyperlipidemia   . Left ureteral stone   . Normal exercise tolerance test results    12-24-2015 (dr hochrein)  no chest pain, no signficant arrhythmias , 85% max heartrate achieved    Current Outpatient  Medications:  .  cetirizine (ZYRTEC) 10 MG tablet, Take 1 tablet (10 mg total) by mouth at bedtime., Disp: 30 tablet, Rfl: 11 .  cholecalciferol (VITAMIN D3) 25 MCG (1000 UT) tablet, Take 2,000 Units by mouth daily., Disp: , Rfl:  .  DEPAKOTE 500 MG DR tablet, TAKE 2 TABLETS BY MOUTH 2 TIMES A DAY, Disp: 120 tablet, Rfl: 3 .  famotidine (PEPCID) 20 MG tablet, Take 1 tablet (20 mg total) by mouth 2 (two) times daily., Disp: 60 tablet, Rfl: 1 .  fish oil-omega-3 fatty acids 1000 MG capsule, Take 1 capsule by mouth daily.  , Disp: , Rfl:  .  fluticasone (FLONASE) 50 MCG/ACT nasal spray, Place 2 sprays into both nostrils daily., Disp: 16 g, Rfl: 6 .  hydrOXYzine (ATARAX/VISTARIL) 25 MG tablet, Take 0.5-1 tablets (12.5-25 mg total) by mouth every 8 (eight) hours as needed for anxiety., Disp: 30 tablet, Rfl: 3 .  pantoprazole (PROTONIX) 40 MG tablet, Take 1 tablet (40 mg total) by mouth daily., Disp: 90 tablet, Rfl: 3 .  sertraline (ZOLOFT) 50 MG tablet, Take 1 tablet (50 mg total) by mouth daily., Disp: 30 tablet, Rfl: 2 .  TEGRETOL 200 MG tablet, TAKE 1 & 1/2 (ONE & ONE-HALF) TABLETS BY MOUTH IN THE MORNING AND 1 WITH LUNCH AND 1 & 1/2 IN THE EVENING, Disp: 360 tablet, Rfl: 0 .  zonisamide (ZONEGRAN) 100 MG capsule, TAKE 1 CAPSULE BY MOUTH ONCE DAILY, Disp: 90 capsule, Rfl: 0  Assessment/ Plan: 58 y.o.  female   1. Irritable bowel syndrome with diarrhea Trial of Viberzi.  No apparent contraindications.  We discussed possible side effects including severe constipation.  She will take 1 tablet twice daily.  National narcotic database was reviewed and there were no red flags.  She will follow-up in 1 month for recheck.  We discussed the possibility that her insurance would not pay for this medication which case may need to do prior authorization versus refer her back to her gastroenterologist.  She voiced good understanding. - Eluxadoline (VIBERZI) 100 MG TABS; Take 1 tablet (100 mg total) by mouth 2 (two)  times daily.  Dispense: 60 tablet; Refill: 0   Start time: 2:30pm End time: 2:40pm  Total time spent on patient care (including telephone call/ virtual visit): 15 minutes  [place], [place] [phone]

## 2019-10-15 NOTE — Patient Instructions (Signed)
Low-FODMAP Eating Plan  FODMAPs (fermentable oligosaccharides, disaccharides, monosaccharides, and polyols) are sugars that are hard for some people to digest. A low-FODMAP eating plan may help some people who have bowel (intestinal) diseases to manage their symptoms. This meal plan can be complicated to follow. Work with a diet and nutrition specialist (dietitian) to make a low-FODMAP eating plan that is right for you. A dietitian can make sure that you get enough nutrition from this diet. What are tips for following this plan? Reading food labels  Check labels for hidden FODMAPs such as: ? High-fructose syrup. ? Honey. ? Agave. ? Natural fruit flavors. ? Onion or garlic powder.  Choose low-FODMAP foods that contain 3-4 grams of fiber per serving.  Check food labels for serving sizes. Eat only one serving at a time to make sure FODMAP levels stay low. Meal planning  Follow a low-FODMAP eating plan for up to 6 weeks, or as told by your health care provider or dietitian.  To follow the eating plan: 1. Eliminate high-FODMAP foods from your diet completely. 2. Gradually reintroduce high-FODMAP foods into your diet one at a time. Most people should wait a few days after introducing one high-FODMAP food before they introduce the next high-FODMAP food. Your dietitian can recommend how quickly you may reintroduce foods. 3. Keep a daily record of what you eat and drink, and make note of any symptoms that you have after eating. 4. Review your daily record with a dietitian regularly. Your dietitian can help you identify which foods you can eat and which foods you should avoid. General tips  Drink enough fluid each day to keep your urine pale yellow.  Avoid processed foods. These often have added sugar and may be high in FODMAPs.  Avoid most dairy products, whole grains, and sweeteners.  Work with a dietitian to make sure you get enough fiber in your diet. Recommended foods Grains   Gluten-free grains, such as rice, oats, buckwheat, quinoa, corn, polenta, and millet. Gluten-free pasta, bread, or cereal. Rice noodles. Corn tortillas. Vegetables  Eggplant, zucchini, cucumber, peppers, green beans, Brussels sprouts, bean sprouts, lettuce, arugula, kale, Swiss chard, spinach, collard greens, bok choy, summer squash, potato, and tomato. Limited amounts of corn, carrot, and sweet potato. Green parts of scallions. Fruits  Bananas, oranges, lemons, limes, blueberries, raspberries, strawberries, grapes, cantaloupe, honeydew melon, kiwi, papaya, passion fruit, and pineapple. Limited amounts of dried cranberries, banana chips, and shredded coconut. Dairy  Lactose-free milk, yogurt, and kefir. Lactose-free cottage cheese and ice cream. Non-dairy milks, such as almond, coconut, hemp, and rice milk. Yogurts made of non-dairy milks. Limited amounts of goat cheese, brie, mozzarella, parmesan, swiss, and other hard cheeses. Meats and other protein foods  Unseasoned beef, pork, poultry, or fish. Eggs. Bacon. Tofu (firm) and tempeh. Limited amounts of nuts and seeds, such as almonds, walnuts, brazil nuts, pecans, peanuts, pumpkin seeds, chia seeds, and sunflower seeds. Fats and oils  Butter-free spreads. Vegetable oils, such as olive, canola, and sunflower oil. Seasoning and other foods  Artificial sweeteners with names that do not end in "ol" such as aspartame, saccharine, and stevia. Maple syrup, white table sugar, raw sugar, brown sugar, and molasses. Fresh basil, coriander, parsley, rosemary, and thyme. Beverages  Water and mineral water. Sugar-sweetened soft drinks. Small amounts of orange juice or cranberry juice. Black and green tea. Most dry wines. Coffee. This may not be a complete list of low-FODMAP foods. Talk with your dietitian for more information. Foods to avoid Grains  Wheat,   including kamut, durum, and semolina. Barley and bulgur. Couscous. Wheat-based cereals. Wheat  noodles, bread, crackers, and pastries. Vegetables  Chicory root, artichoke, asparagus, cabbage, snow peas, sugar snap peas, mushrooms, and cauliflower. Onions, garlic, leeks, and the white part of scallions. Fruits  Fresh, dried, and juiced forms of apple, pear, watermelon, peach, plum, cherries, apricots, blackberries, boysenberries, figs, nectarines, and mango. Avocado. Dairy  Milk, yogurt, ice cream, and soft cheese. Cream and sour cream. Milk-based sauces. Custard. Meats and other protein foods  Fried or fatty meat. Sausage. Cashews and pistachios. Soybeans, baked beans, black beans, chickpeas, kidney beans, fava beans, navy beans, lentils, and split peas. Seasoning and other foods  Any sugar-free gum or candy. Foods that contain artificial sweeteners such as sorbitol, mannitol, isomalt, or xylitol. Foods that contain honey, high-fructose corn syrup, or agave. Bouillon, vegetable stock, beef stock, and chicken stock. Garlic and onion powder. Condiments made with onion, such as hummus, chutney, pickles, relish, salad dressing, and salsa. Tomato paste. Beverages  Chicory-based drinks. Coffee substitutes. Chamomile tea. Fennel tea. Sweet or fortified wines such as port or sherry. Diet soft drinks made with isomalt, mannitol, maltitol, sorbitol, or xylitol. Apple, pear, and mango juice. Juices with high-fructose corn syrup. This may not be a complete list of high-FODMAP foods. Talk with your dietitian to discuss what dietary choices are best for you.  Summary  A low-FODMAP eating plan is a short-term diet that eliminates FODMAPs from your diet to help ease symptoms of certain bowel diseases.  The eating plan usually lasts up to 6 weeks. After that, high-FODMAP foods are restarted gradually, one at a time, so you can find out which may be causing symptoms.  A low-FODMAP eating plan can be complicated. It is best to work with a dietitian who has experience with this type of plan. This  information is not intended to replace advice given to you by your health care provider. Make sure you discuss any questions you have with your health care provider. Document Released: 05/30/2017 Document Revised: 09/15/2017 Document Reviewed: 05/30/2017 Elsevier Patient Education  2020 Elsevier Inc.  

## 2019-10-23 ENCOUNTER — Telehealth: Payer: Self-pay | Admitting: *Deleted

## 2019-10-23 NOTE — Telephone Encounter (Signed)
Prior Auth for Viberzi 100mg  tab-IN Process  PA #  [id number]  [facility]

## 2019-10-28 NOTE — Telephone Encounter (Signed)
Approved patient has already picked up

## 2019-11-25 ENCOUNTER — Other Ambulatory Visit: Payer: Self-pay | Admitting: Family Medicine

## 2019-11-25 DIAGNOSIS — K580 Irritable bowel syndrome with diarrhea: Secondary | ICD-10-CM

## 2019-11-29 ENCOUNTER — Other Ambulatory Visit: Payer: Self-pay

## 2019-11-29 DIAGNOSIS — G40909 Epilepsy, unspecified, not intractable, without status epilepticus: Secondary | ICD-10-CM

## 2019-11-29 MED ORDER — DEPAKOTE 500 MG PO TBEC: DELAYED_RELEASE_TABLET | ORAL | 2 refills | Status: DC

## 2019-12-02 ENCOUNTER — Ambulatory Visit (INDEPENDENT_AMBULATORY_CARE_PROVIDER_SITE_OTHER): Payer: Medicaid Other | Admitting: Family Medicine

## 2019-12-02 DIAGNOSIS — E782 Mixed hyperlipidemia: Secondary | ICD-10-CM | POA: Diagnosis not present

## 2019-12-02 DIAGNOSIS — G40909 Epilepsy, unspecified, not intractable, without status epilepticus: Secondary | ICD-10-CM

## 2019-12-02 DIAGNOSIS — K580 Irritable bowel syndrome with diarrhea: Secondary | ICD-10-CM | POA: Diagnosis not present

## 2019-12-02 DIAGNOSIS — R4589 Other symptoms and signs involving emotional state: Secondary | ICD-10-CM

## 2019-12-02 MED ORDER — TEGRETOL 200 MG PO TABS: ORAL_TABLET | ORAL | 0 refills | Status: DC

## 2019-12-02 MED ORDER — ZONISAMIDE 100 MG PO CAPS: ORAL_CAPSULE | ORAL | 0 refills | Status: DC

## 2019-12-02 MED ORDER — VIBERZI 100 MG PO TABS: 1.0000 | ORAL_TABLET | Freq: Two times a day (BID) | ORAL | 2 refills | Status: DC

## 2019-12-02 NOTE — Progress Notes (Signed)
Telephone visit  Subjective: CC: f/u IBS-D, seizure disorder PCP: Janora Norlander, DO [id number] Savannah Shaw is a 59 y.o. female calls for telephone consult today. Patient provides verbal consent for consult held via phone.  Due to COVID-19 pandemic this visit was conducted virtually. This visit type was conducted due to national recommendations for restrictions regarding the COVID-19 Pandemic (e.g. social distancing, sheltering in place) in an effort to limit this patient's exposure and mitigate transmission in our community. All issues noted in this document were discussed and addressed.  A physical exam was not performed with this format.   Location of patient: home Location of provider: Working remotely from home Others present for call: none  1. IBS-D/ GERD Patient reports that the Viberzi has been doing very well to control her diarrhea.  She notes that it took only about 2-3 days before she saw improvement in symptoms.  No constipation, hematochezia, melena, nausea, vomiting.  She is compliant with Protonix for GERD.  She ran out about 1 week ago and food has been "running through her" since.  She picked up imodium due to severity of symptoms off medication.  2. Seizures Denies seizure activity.  Compliant with medications.  She occasionally has tremor in hands.  3. Depression She has discontinued Zoloft.  Her grandson moved out and now the stress she was experiencing has improved.  She has it on hand if needed.  ROS: Per HPI  Allergies  Allergen Reactions  . Terbinafine Hcl Rash    Lamasil  . Topamax [Topiramate] Rash   Past Medical History:  Diagnosis Date  . GERD (gastroesophageal reflux disease)   . 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 History of acute pyelonephritis    w/ urosepsis 12-31-2015  resolved  . History of kidney stones   . Hyperlipidemia   . Left ureteral stone   . Normal  exercise tolerance test results    12-24-2015 (dr hochrein)  no chest pain, no signficant arrhythmias , 85% max heartrate achieved    Current Outpatient Medications:  .  cetirizine (ZYRTEC) 10 MG tablet, Take 1 tablet (10 mg total) by mouth at bedtime., Disp: 30 tablet, Rfl: 11 .  cholecalciferol (VITAMIN D3) 25 MCG (1000 UT) tablet, Take 2,000 Units by mouth daily., Disp: , Rfl:  .  DEPAKOTE 500 MG DR tablet, TAKE 2 TABLETS BY MOUTH 2 TIMES A DAY, Disp: 120 tablet, Rfl: 2 .  famotidine (PEPCID) 20 MG tablet, Take 1 tablet (20 mg total) by mouth 2 (two) times daily., Disp: 60 tablet, Rfl: 1 .  fish oil-omega-3 fatty acids 1000 MG capsule, Take 1 capsule by mouth daily.  , Disp: , Rfl:  .  fluticasone (FLONASE) 50 MCG/ACT nasal spray, Place 2 sprays into both nostrils daily., Disp: 16 g, Rfl: 6 .  hydrOXYzine (ATARAX/VISTARIL) 25 MG tablet, Take 0.5-1 tablets (12.5-25 mg total) by mouth every 8 (eight) hours as needed for anxiety., Disp: 30 tablet, Rfl: 3 .  pantoprazole (PROTONIX) 40 MG tablet, Take 1 tablet (40 mg total) by mouth daily., Disp: 90 tablet, Rfl: 3 .  sertraline (ZOLOFT) 50 MG tablet, Take 1 tablet (50 mg total) by mouth daily., Disp: 30 tablet, Rfl: 2 .  TEGRETOL 200 MG tablet, TAKE 1 & 1/2 (ONE & ONE-HALF) TABLETS BY MOUTH IN THE MORNING AND 1 WITH LUNCH AND 1 & 1/2 IN THE EVENING, Disp: 360 tablet, Rfl: 0 .  VIBERZI 100 MG TABS, Take 1 tablet by mouth twice daily, Disp: 60 tablet, Rfl: 0 .  zonisamide (ZONEGRAN) 100 MG capsule, TAKE 1 CAPSULE BY MOUTH ONCE DAILY, Disp: 90 capsule, Rfl: 0  Assessment/ Plan: 59 y.o. female   1. Seizure disorder ([place]) Stable.  Check labs. - CMP14+EGFR; Future - CBC; Future - zonisamide (ZONEGRAN) 100 MG capsule; TAKE 1 CAPSULE BY MOUTH ONCE DAILY  Dispense: 90 capsule; Refill: 0 - TEGRETOL 200 MG tablet; TAKE 1 & 1/2 (ONE & ONE-HALF) TABLETS BY MOUTH IN THE MORNING AND 1 WITH LUNCH AND 1 & 1/2 IN THE EVENING  Dispense: 360 tablet; Refill:  0  2. Depressed mood Improving without medication  3. Mixed hyperlipidemia She will come in for fasting labs - CMP14+EGFR; Future - Lipid panel; Future  4. Irritable bowel syndrome with diarrhea Was controlled on medication before she ran out.  Renewal sent.  Plan for CSC/ UDS at next visit per office protocol.  The Narcotic Database has been reviewed.  There were no red flags.   - Eluxadoline (VIBERZI) 100 MG TABS; Take 1 tablet (100 mg total) by mouth 2 (two) times daily.  Dispense: 60 tablet; Refill: 2   Start time: 10:11am End time: 10:24am  Total time spent on patient care (including telephone call/ virtual visit): 25 minutes  [place], [place] [phone]

## 2019-12-19 ENCOUNTER — Other Ambulatory Visit: Payer: Medicaid Other

## 2019-12-19 DIAGNOSIS — E782 Mixed hyperlipidemia: Secondary | ICD-10-CM | POA: Diagnosis not present

## 2019-12-19 DIAGNOSIS — G40909 Epilepsy, unspecified, not intractable, without status epilepticus: Secondary | ICD-10-CM | POA: Diagnosis not present

## 2019-12-20 LAB — CBC
Hematocrit: 37.2 % (ref 34.0–46.6)
Hemoglobin: 12.9 g/dL (ref 11.1–15.9)
MCH: 34.2 pg — ABNORMAL HIGH (ref 26.6–33.0)
MCHC: 34.7 g/dL (ref 31.5–35.7)
MCV: 99 fL — ABNORMAL HIGH (ref 79–97)
Platelets: 236 10*3/uL (ref 150–450)
RBC: 3.77 x10E6/uL (ref 3.77–5.28)
RDW: 12.7 % (ref 11.7–15.4)
WBC: 3.8 10*3/uL (ref 3.4–10.8)

## 2019-12-20 LAB — CMP14+EGFR
ALT: 15 IU/L (ref 0–32)
AST: 27 IU/L (ref 0–40)
Albumin/Globulin Ratio: 1.7 (ref 1.2–2.2)
Albumin: 4.2 g/dL (ref 3.8–4.9)
Alkaline Phosphatase: 34 IU/L — ABNORMAL LOW (ref 39–117)
BUN/Creatinine Ratio: 18 (ref 9–23)
BUN: 8 mg/dL (ref 6–24)
Bilirubin Total: 0.3 mg/dL (ref 0.0–1.2)
CO2: 22 mmol/L (ref 20–29)
Calcium: 9.1 mg/dL (ref 8.7–10.2)
Chloride: 105 mmol/L (ref 96–106)
Creatinine, Ser: 0.45 mg/dL — ABNORMAL LOW (ref 0.57–1.00)
GFR calc Af Amer: 128 mL/min/{1.73_m2} (ref >59)
GFR calc non Af Amer: 111 mL/min/{1.73_m2} (ref >59)
Globulin, Total: 2.5 g/dL (ref 1.5–4.5)
Glucose: 96 mg/dL (ref 65–99)
Potassium: 4.4 mmol/L (ref 3.5–5.2)
Sodium: 141 mmol/L (ref 134–144)
Total Protein: 6.7 g/dL (ref 6.0–8.5)

## 2019-12-20 LAB — LIPID PANEL
Chol/HDL Ratio: 4.5 ratio — ABNORMAL HIGH (ref 0.0–4.4)
Cholesterol, Total: 253 mg/dL — ABNORMAL HIGH (ref 100–199)
HDL: 56 mg/dL (ref >39)
LDL Chol Calc (NIH): 172 mg/dL — ABNORMAL HIGH (ref 0–99)
Triglycerides: 137 mg/dL (ref 0–149)
VLDL Cholesterol Cal: 25 mg/dL (ref 5–40)

## 2020-02-18 ENCOUNTER — Ambulatory Visit: Payer: Medicaid Other | Admitting: Family Medicine

## 2020-02-18 ENCOUNTER — Encounter: Payer: Self-pay | Admitting: Family Medicine

## 2020-02-18 ENCOUNTER — Other Ambulatory Visit: Payer: Self-pay

## 2020-02-18 VITALS — BP 127/79 | HR 68 | Temp 98.1°F | Ht 65.0 in | Wt 158.0 lb

## 2020-02-18 DIAGNOSIS — G40909 Epilepsy, unspecified, not intractable, without status epilepticus: Secondary | ICD-10-CM | POA: Diagnosis not present

## 2020-02-18 DIAGNOSIS — M170 Bilateral primary osteoarthritis of knee: Secondary | ICD-10-CM

## 2020-02-18 DIAGNOSIS — K580 Irritable bowel syndrome with diarrhea: Secondary | ICD-10-CM | POA: Diagnosis not present

## 2020-02-18 DIAGNOSIS — E782 Mixed hyperlipidemia: Secondary | ICD-10-CM | POA: Diagnosis not present

## 2020-02-18 DIAGNOSIS — M810 Age-related osteoporosis without current pathological fracture: Secondary | ICD-10-CM | POA: Diagnosis not present

## 2020-02-18 DIAGNOSIS — Z79899 Other long term (current) drug therapy: Secondary | ICD-10-CM

## 2020-02-18 MED ORDER — ZONISAMIDE 100 MG PO CAPS: ORAL_CAPSULE | ORAL | 1 refills | Status: DC

## 2020-02-18 MED ORDER — DICLOFENAC SODIUM 1 % EX GEL: 4.0000 g | Freq: Four times a day (QID) | CUTANEOUS | 2 refills | Status: DC

## 2020-02-18 MED ORDER — DEPAKOTE 500 MG PO TBEC: DELAYED_RELEASE_TABLET | ORAL | 1 refills | Status: DC

## 2020-02-18 MED ORDER — TEGRETOL 200 MG PO TABS: ORAL_TABLET | ORAL | 1 refills | Status: DC

## 2020-02-18 MED ORDER — VIBERZI 100 MG PO TABS: 1.0000 | ORAL_TABLET | Freq: Two times a day (BID) | ORAL | 5 refills | Status: DC

## 2020-02-18 NOTE — Patient Instructions (Signed)

## 2020-02-18 NOTE — Progress Notes (Signed)
Subjective: CC: f/u seizure disorder, osteoporosis, IBS-D PCP: Janora Norlander, DO [id number] Savannah Shaw is a 59 y.o. female presenting to clinic today for:  1. Seizure disorder Compliant with Depakote, Zonegran and Tegretol.  Denies any major seizure activity.  Has some tremor here and there but has had discussion with neuro in past and no changes to meds recommended.  2.  IBS-D Controlled with Viberzi.  No adverse side effects  3. Osteoporosis/ chronic knee pain Being treated with Prolia.  No adverse side effects.  Last DEXA 06/2018 T score -3.8.  Tries to incorporate Vit D and Ca in diet.  She does have chronic bilateral knee pain that is refractory to OTC topical analgesics.  ROS: Per HPI  Allergies  Allergen Reactions  . Terbinafine Hcl Rash    Lamasil  . Topamax [Topiramate] Rash   Past Medical History:  Diagnosis Date  . GERD (gastroesophageal reflux disease)   . 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 History of acute pyelonephritis    w/ urosepsis 12-31-2015  resolved  . History of kidney stones   . Hyperlipidemia   . Left ureteral stone   . Normal exercise tolerance test results    12-24-2015 (dr hochrein)  no chest pain, no signficant arrhythmias , 85% max heartrate achieved    Current Outpatient Medications:  .  cetirizine (ZYRTEC) 10 MG tablet, Take 1 tablet (10 mg total) by mouth at bedtime., Disp: 30 tablet, Rfl: 11 .  cholecalciferol (VITAMIN D3) 25 MCG (1000 UT) tablet, Take 2,000 Units by mouth daily., Disp: , Rfl:  .  DEPAKOTE 500 MG DR tablet, TAKE 2 TABLETS BY MOUTH 2 TIMES A DAY, Disp: 120 tablet, Rfl: 2 .  Eluxadoline (VIBERZI) 100 MG TABS, Take 1 tablet (100 mg total) by mouth 2 (two) times daily., Disp: 60 tablet, Rfl: 2 .  fish oil-omega-3 fatty acids 1000 MG capsule, Take 1 capsule by mouth daily.  , Disp: , Rfl:  .  fluticasone (FLONASE) 50 MCG/ACT nasal spray, Place 2  sprays into both nostrils daily., Disp: 16 g, Rfl: 6 .  hydrOXYzine (ATARAX/VISTARIL) 25 MG tablet, Take 0.5-1 tablets (12.5-25 mg total) by mouth every 8 (eight) hours as needed for anxiety., Disp: 30 tablet, Rfl: 3 .  pantoprazole (PROTONIX) 40 MG tablet, Take 1 tablet (40 mg total) by mouth daily., Disp: 90 tablet, Rfl: 3 .  TEGRETOL 200 MG tablet, TAKE 1 & 1/2 (ONE & ONE-HALF) TABLETS BY MOUTH IN THE MORNING AND 1 WITH LUNCH AND 1 & 1/2 IN THE EVENING, Disp: 360 tablet, Rfl: 0 .  zonisamide (ZONEGRAN) 100 MG capsule, TAKE 1 CAPSULE BY MOUTH ONCE DAILY, Disp: 90 capsule, Rfl: 0 Social History   Socioeconomic History  . Marital status: Single    Spouse name: Not on file  . Number of children: Not on file  . Years of education: Not on file  . Highest education level: Not on file  Occupational History  . Occupation: Disabled    Comment: Seizures  Tobacco Use  . Smoking status: Former Smoker    Packs/day: 1.00    Years: 5.00    Pack years: 5.00    Types: Cigarettes    Quit date: 01/11/2011    Years since quitting: 9.1  . Smokeless tobacco: Never Used  Substance and Sexual Activity  . Alcohol use: No  . Drug use: No  . Sexual activity:  Not on file  Other Topics Concern  . Not on file  Social History Narrative   Daily Caffeine use - 3   Social Determinants of Health   Financial Resource Strain:   . Difficulty of Paying Living Expenses:   Food Insecurity:   . Worried About Charity fundraiser in the Last Year:   . Arboriculturist in the Last Year:   Transportation Needs:   . Film/video editor (Medical):   Marland Kitchen Lack of Transportation (Non-Medical):   Physical Activity:   . Days of Exercise per Week:   . Minutes of Exercise per Session:   Stress:   . Feeling of Stress :   Social Connections:   . Frequency of Communication with Friends and Family:   . Frequency of Social Gatherings with Friends and Family:   . Attends Religious Services:   . Active Member of Clubs or  Organizations:   . Attends Archivist Meetings:   Marland Kitchen Marital Status:   Intimate Partner Violence:   . Fear of Current or Ex-Partner:   . Emotionally Abused:   Marland Kitchen Physically Abused:   . Sexually Abused:    Family History  Problem Relation Age of Onset  . Diabetes Mother   . Heart disease Mother 27       No details about the type of heart disease.    . Prostate cancer Other   . Colon cancer Neg Hx     Objective: Office vital signs reviewed. BP 127/79   Pulse 68   Temp 98.1 F (36.7 C) (Oral)   Ht 5\' 5"  (1.651 m)   Wt 158 lb (71.7 kg)   SpO2 97%   BMI 26.29 kg/m   Physical Examination:  General: Awake, alert, well nourished, No acute distress HEENT: Normal, EOMI, PERRL Cardio: regular rate and rhythm, S1S2 heard, no murmurs appreciated Pulm: clear to auscultation bilaterally, no wheezes, rhonchi or rales; normal work of breathing on room air Extremities: warm, well perfused, No edema, cyanosis or clubbing; +2 pulses bilaterally MSK: normal gait and station  Knees: no effusion, soft tissue swelling, palpable bony abnormalities Psych: Mood stable, speech normal. Happy. Depression screen [facility] 2/9 02/18/2020 06/12/2019 12/12/2018  Decreased Interest 0 2 0  Down, Depressed, Hopeless 0 3 0  PHQ - 2 Score 0 5 0  Altered sleeping 0 1 1  Tired, decreased energy 0 1 1  Change in appetite 0 1 0  Feeling bad or failure about yourself  0 1 0  Trouble concentrating 0 0 0  Moving slowly or fidgety/restless 0 0 0  Suicidal thoughts 0 0 0  PHQ-9 Score 0 9 2  Difficult doing work/chores - Somewhat difficult -  Some recent data might be hidden     Assessment/ Plan: 59 y.o. female   1. Seizure disorder ([place]) Stable. Plan for repeat CMP, CBC in 11m - DEPAKOTE 500 MG DR tablet; TAKE 2 TABLETS BY MOUTH 2 TIMES A DAY  Dispense: 360 tablet; Refill: 1 - TEGRETOL 200 MG tablet; TAKE 1 & 1/2 (ONE & ONE-HALF) TABLETS BY MOUTH IN THE MORNING AND 1 WITH LUNCH AND 1 & 1/2 IN THE EVENING   Dispense: 360 tablet; Refill: 1 - zonisamide (ZONEGRAN) 100 MG capsule; TAKE 1 CAPSULE BY MOUTH ONCE DAILY  Dispense: 90 capsule; Refill: 1  2. Irritable bowel syndrome with diarrhea Controlled. The Narcotic Database has been reviewed.  There were no red flags.  CSC and UDS completed as per office  policy - ToxASSURE [employer] 13 (MW), Urine - Eluxadoline (VIBERZI) 100 MG TABS; Take 1 tablet (100 mg total) by mouth 2 (two) times daily.  Dispense: 60 tablet; Refill: 5  3. Controlled substance agreement signed - ToxASSURE [employer] 13 (MW), Urine  4. Mixed hyperlipidemia - Lipid Panel - Hepatic Function Panel  5. Age-related osteoporosis without current pathological fracture Due for Prolia in June and DEXA in September.  Check Vit D - VITAMIN D 25 Hydroxy (Vit-D Deficiency, Fractures) - DG [facility] DEXA; Future  6. Primary osteoarthritis of both knees Trial of topical NSAID.  May need to consider Celebrex/ Naproxen.  Discussed possibility of injections as well - diclofenac Sodium (VOLTAREN) 1 % GEL; Apply 4 g topically 4 (four) times daily. To knees for pain  Dispense: 400 g; Refill: 2    Orders Placed This Encounter  Procedures  . [facility] DEXA    Standing Status:   Future    Standing Expiration Date:   04/19/2021    Scheduling Instructions:     DUE IN September.  Currently on Prolia, next injection 03/2020    Order Specific Question:   Reason for Exam (SYMPTOM  OR DIAGNOSIS REQUIRED)    Answer:   follow up osteoporosis    Order Specific Question:   Is the patient pregnant?    Answer:   No  . ToxASSURE [employer] 13 (MW), Urine  . Lipid Panel  . VITAMIN D 25 Hydroxy (Vit-D Deficiency, Fractures)  . Hepatic Function Panel   Meds ordered this encounter  Medications  . Eluxadoline (VIBERZI) 100 MG TABS    Sig: Take 1 tablet (100 mg total) by mouth 2 (two) times daily.    Dispense:  60 tablet    Refill:  5  . DEPAKOTE 500 MG DR tablet    Sig: TAKE 2 TABLETS BY MOUTH 2 TIMES A DAY     Dispense:  360 tablet    Refill:  1  . TEGRETOL 200 MG tablet    Sig: TAKE 1 & 1/2 (ONE & ONE-HALF) TABLETS BY MOUTH IN THE MORNING AND 1 WITH LUNCH AND 1 & 1/2 IN THE EVENING    Dispense:  360 tablet    Refill:  1  . zonisamide (ZONEGRAN) 100 MG capsule    Sig: TAKE 1 CAPSULE BY MOUTH ONCE DAILY    Dispense:  90 capsule    Refill:  Bartholomew, DO [address] [phone]

## 2020-02-19 LAB — VITAMIN D 25 HYDROXY (VIT D DEFICIENCY, FRACTURES): Vit D, 25-Hydroxy: 37.5 ng/mL (ref 30.0–100.0)

## 2020-02-19 LAB — LIPID PANEL
Chol/HDL Ratio: 3.8 ratio (ref 0.0–4.4)
Cholesterol, Total: 223 mg/dL — ABNORMAL HIGH (ref 100–199)
HDL: 58 mg/dL (ref >39)
LDL Chol Calc (NIH): 144 mg/dL — ABNORMAL HIGH (ref 0–99)
Triglycerides: 120 mg/dL (ref 0–149)
VLDL Cholesterol Cal: 21 mg/dL (ref 5–40)

## 2020-02-19 LAB — HEPATIC FUNCTION PANEL
ALT: 9 IU/L (ref 0–32)
AST: 17 IU/L (ref 0–40)
Albumin: 4.3 g/dL (ref 3.8–4.9)
Alkaline Phosphatase: 35 IU/L — ABNORMAL LOW (ref 39–117)
Bilirubin Total: <0.2 mg/dL (ref 0.0–1.2)
Bilirubin, Direct: 0.04 mg/dL (ref 0.00–0.40)
Total Protein: 6.5 g/dL (ref 6.0–8.5)

## 2020-02-20 LAB — TOXASSURE SELECT 13 (MW), URINE

## 2020-03-05 DIAGNOSIS — H2512 Age-related nuclear cataract, left eye: Secondary | ICD-10-CM | POA: Diagnosis not present

## 2020-03-05 DIAGNOSIS — H25811 Combined forms of age-related cataract, right eye: Secondary | ICD-10-CM | POA: Diagnosis not present

## 2020-03-05 DIAGNOSIS — H25812 Combined forms of age-related cataract, left eye: Secondary | ICD-10-CM | POA: Diagnosis not present

## 2020-03-05 DIAGNOSIS — Z01818 Encounter for other preprocedural examination: Secondary | ICD-10-CM | POA: Diagnosis not present

## 2020-03-17 DIAGNOSIS — H1045 Other chronic allergic conjunctivitis: Secondary | ICD-10-CM | POA: Diagnosis not present

## 2020-03-19 DIAGNOSIS — H25811 Combined forms of age-related cataract, right eye: Secondary | ICD-10-CM | POA: Diagnosis not present

## 2020-03-19 DIAGNOSIS — H2511 Age-related nuclear cataract, right eye: Secondary | ICD-10-CM | POA: Diagnosis not present

## 2020-03-26 DIAGNOSIS — H1045 Other chronic allergic conjunctivitis: Secondary | ICD-10-CM | POA: Diagnosis not present

## 2020-03-29 DIAGNOSIS — H5213 Myopia, bilateral: Secondary | ICD-10-CM | POA: Diagnosis not present

## 2020-04-14 ENCOUNTER — Other Ambulatory Visit: Payer: Self-pay | Admitting: *Deleted

## 2020-04-14 DIAGNOSIS — M810 Age-related osteoporosis without current pathological fracture: Secondary | ICD-10-CM

## 2020-04-14 MED ORDER — DENOSUMAB 60 MG/ML ~~LOC~~ SOSY: 60.0000 mg | PREFILLED_SYRINGE | Freq: Once | SUBCUTANEOUS | 0 refills | Status: AC

## 2020-04-21 ENCOUNTER — Telehealth: Payer: Self-pay | Admitting: Family Medicine

## 2020-04-22 NOTE — Telephone Encounter (Signed)
The PA form is in the Prolia binder for Dr Darnell Level for her nurses.

## 2020-05-18 ENCOUNTER — Other Ambulatory Visit: Payer: Self-pay | Admitting: Family Medicine

## 2020-05-18 DIAGNOSIS — K219 Gastro-esophageal reflux disease without esophagitis: Secondary | ICD-10-CM

## 2020-08-18 ENCOUNTER — Other Ambulatory Visit: Payer: Self-pay

## 2020-08-18 ENCOUNTER — Encounter: Payer: Self-pay | Admitting: Family Medicine

## 2020-08-18 ENCOUNTER — Ambulatory Visit (INDEPENDENT_AMBULATORY_CARE_PROVIDER_SITE_OTHER): Payer: Medicaid Other

## 2020-08-18 ENCOUNTER — Ambulatory Visit (INDEPENDENT_AMBULATORY_CARE_PROVIDER_SITE_OTHER): Payer: Medicaid Other | Admitting: Family Medicine

## 2020-08-18 VITALS — BP 125/73 | HR 80 | Temp 97.3°F | Ht 65.0 in | Wt 157.0 lb

## 2020-08-18 DIAGNOSIS — G40909 Epilepsy, unspecified, not intractable, without status epilepticus: Secondary | ICD-10-CM

## 2020-08-18 DIAGNOSIS — M810 Age-related osteoporosis without current pathological fracture: Secondary | ICD-10-CM

## 2020-08-18 NOTE — Progress Notes (Signed)
Subjective: CC: f/u seizure disorder, osteoporosis  PCP: Janora Norlander, DO HEN:IDPOE Savannah Shaw is a 59 y.o. female presenting to clinic today for:  1. Seizure disorder Patient is compliant with her antiepileptic medications.  She has had no breakthrough seizures since her last visit.  2. Osteoporosis/ chronic knee pain HIstory: 2019 T score -3.8. Started on Prolia  Patient is due for her repeat DEXA scan.  Apparently she was not able to get her Prolia injection this summer.  This appears to be an issue with her insurance covering the medication and she never heard back as to whether or not it would be covered.  She does report chronic ankle and knee pain.  She wonders if this is a tendinitis issue or because she has bone loss.  ROS: Per HPI  Allergies  Allergen Reactions  . Terbinafine Hcl Rash    Lamasil  . Topamax [Topiramate] Rash   Past Medical History:  Diagnosis Date  . GERD (gastroesophageal reflux disease)   . 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 History of acute pyelonephritis    w/ urosepsis 12-31-2015  resolved  . History of kidney stones   . Hyperlipidemia   . Left ureteral stone   . Normal exercise tolerance test results    12-24-2015 (dr hochrein)  no chest pain, no signficant arrhythmias , 85% max heartrate achieved    Current Outpatient Medications:  .  pantoprazole (PROTONIX) 40 MG tablet, Take 1 tablet by mouth once daily, Disp: 90 tablet, Rfl: 0 .  cetirizine (ZYRTEC) 10 MG tablet, Take 1 tablet (10 mg total) by mouth at bedtime., Disp: 30 tablet, Rfl: 11 .  cholecalciferol (VITAMIN D3) 25 MCG (1000 UT) tablet, Take 2,000 Units by mouth daily., Disp: , Rfl:  .  DEPAKOTE 500 MG DR tablet, TAKE 2 TABLETS BY MOUTH 2 TIMES A DAY, Disp: 360 tablet, Rfl: 1 .  diclofenac Sodium (VOLTAREN) 1 % GEL, Apply 4 g topically 4 (four) times daily. To knees for pain, Disp: 400 g, Rfl: 2 .   Eluxadoline (VIBERZI) 100 MG TABS, Take 1 tablet (100 mg total) by mouth 2 (two) times daily., Disp: 60 tablet, Rfl: 5 .  fish oil-omega-3 fatty acids 1000 MG capsule, Take 1 capsule by mouth daily.  , Disp: , Rfl:  .  fluticasone (FLONASE) 50 MCG/ACT nasal spray, Place 2 sprays into both nostrils daily., Disp: 16 g, Rfl: 6 .  hydrOXYzine (ATARAX/VISTARIL) 25 MG tablet, Take 0.5-1 tablets (12.5-25 mg total) by mouth every 8 (eight) hours as needed for anxiety., Disp: 30 tablet, Rfl: 3 .  TEGRETOL 200 MG tablet, TAKE 1 & 1/2 (ONE & ONE-HALF) TABLETS BY MOUTH IN THE MORNING AND 1 WITH LUNCH AND 1 & 1/2 IN THE EVENING, Disp: 360 tablet, Rfl: 1 .  zonisamide (ZONEGRAN) 100 MG capsule, TAKE 1 CAPSULE BY MOUTH ONCE DAILY, Disp: 90 capsule, Rfl: 1 Social History   Socioeconomic History  . Marital status: Single    Spouse name: Not on file  . Number of children: Not on file  . Years of education: Not on file  . Highest education level: Not on file  Occupational History  . Occupation: Disabled    Comment: Seizures  Tobacco Use  . Smoking status: Former Smoker    Packs/day: 1.00    Years: 5.00    Pack years: 5.00    Types: Cigarettes  Quit date: 01/11/2011    Years since quitting: 9.6  . Smokeless tobacco: Never Used  Vaping Use  . Vaping Use: Some days  Substance and Sexual Activity  . Alcohol use: No  . Drug use: No  . Sexual activity: Not on file  Other Topics Concern  . Not on file  Social History Narrative   Daily Caffeine use - 3   Social Determinants of Health   Financial Resource Strain:   . Difficulty of Paying Living Expenses: Not on file  Food Insecurity:   . Worried About Charity fundraiser in the Last Year: Not on file  . Ran Out of Food in the Last Year: Not on file  Transportation Needs:   . Lack of Transportation (Medical): Not on file  . Lack of Transportation (Non-Medical): Not on file  Physical Activity:   . Days of Exercise per Week: Not on file  . Minutes  of Exercise per Session: Not on file  Stress:   . Feeling of Stress : Not on file  Social Connections:   . Frequency of Communication with Friends and Family: Not on file  . Frequency of Social Gatherings with Friends and Family: Not on file  . Attends Religious Services: Not on file  . Active Member of Clubs or Organizations: Not on file  . Attends Archivist Meetings: Not on file  . Marital Status: Not on file  Intimate Partner Violence:   . Fear of Current or Ex-Partner: Not on file  . Emotionally Abused: Not on file  . Physically Abused: Not on file  . Sexually Abused: Not on file   Family History  Problem Relation Age of Onset  . Diabetes Mother   . Heart disease Mother 73       No details about the type of heart disease.    . Prostate cancer Other   . Colon cancer Neg Hx     Objective: Office vital signs reviewed. BP 125/73   Pulse 80   Temp (!) 97.3 F (36.3 C)   Ht _0  (1.651 m)   Wt 157 lb (71.2 kg)   SpO2 100%   BMI 26.13 kg/m   Physical Examination:  General: Awake, alert, well appearing, well nourished, No acute distress Cardio: regular rate and rhythm, S1S2 heard, no murmurs appreciated Pulm: clear to auscultation bilaterally, no wheezes, rhonchi or rales; normal work of breathing on room air Extremities: warm, well perfused, No edema, cyanosis or clubbing; +2 pulses bilaterally MSK: Stiff gait and station Neuro: No focal neurologic deficits. Depression screen [facility] 2/9 08/18/2020 02/18/2020 06/12/2019  Decreased Interest 0 0 2  Down, Depressed, Hopeless 0 0 3  PHQ - 2 Score 0 0 5  Altered sleeping - 0 1  Tired, decreased energy - 0 1  Change in appetite - 0 1  Feeling bad or failure about yourself  - 0 1  Trouble concentrating - 0 0  Moving slowly or fidgety/restless - 0 0  Suicidal thoughts - 0 0  PHQ-9 Score - 0 9  Difficult doing work/chores - - Somewhat difficult  Some recent data might be hidden     Assessment/ Plan: 59 y.o.  female   1. Seizure disorder ([place]) Asymptomatic.  Check CMP, CBC.  Continue current regimen.  Okay to follow-up in 6 months - CMP14+EGFR - CBC  2. Age-related osteoporosis without current pathological fracture Has been without her Prolia.  She is scheduled for repeat DEXA scan today and this  was noted to have an improvement in her bone density.  T score was down to -2.9.  I certainly think that she should continue with Prolia given this excellent response. York Cerise will work on getting this covered through her insurance.  May need to submit her most recent DEXA scan for coverage. - CMP14+EGFR   No orders of the defined types were placed in this encounter.  No orders of the defined types were placed in this encounter.    Janora Norlander, DO [place] [phone]

## 2020-08-18 NOTE — Patient Instructions (Signed)
Savannah Shaw will contact you in the next 48 hours regarding your Prolia.   Apparently, there has been an issue with insurance covering.

## 2020-08-19 LAB — CBC
Hematocrit: 36.4 % (ref 34.0–46.6)
Hemoglobin: 12.6 g/dL (ref 11.1–15.9)
MCH: 33.6 pg — ABNORMAL HIGH (ref 26.6–33.0)
MCHC: 34.6 g/dL (ref 31.5–35.7)
MCV: 97 fL (ref 79–97)
Platelets: 262 10*3/uL (ref 150–450)
RBC: 3.75 x10E6/uL — ABNORMAL LOW (ref 3.77–5.28)
RDW: 12.7 % (ref 11.7–15.4)
WBC: 4.5 10*3/uL (ref 3.4–10.8)

## 2020-08-19 LAB — CMP14+EGFR
ALT: 9 IU/L (ref 0–32)
AST: 18 IU/L (ref 0–40)
Albumin/Globulin Ratio: 1.3 (ref 1.2–2.2)
Albumin: 3.9 g/dL (ref 3.8–4.9)
Alkaline Phosphatase: 57 IU/L (ref 44–121)
BUN/Creatinine Ratio: 21 (ref 9–23)
BUN: 13 mg/dL (ref 6–24)
Bilirubin Total: <0.2 mg/dL (ref 0.0–1.2)
CO2: 17 mmol/L — ABNORMAL LOW (ref 20–29)
Calcium: 9.1 mg/dL (ref 8.7–10.2)
Chloride: 104 mmol/L (ref 96–106)
Creatinine, Ser: 0.62 mg/dL (ref 0.57–1.00)
GFR calc Af Amer: 114 mL/min/{1.73_m2} (ref >59)
GFR calc non Af Amer: 99 mL/min/{1.73_m2} (ref >59)
Globulin, Total: 2.9 g/dL (ref 1.5–4.5)
Glucose: 98 mg/dL (ref 65–99)
Potassium: 4.7 mmol/L (ref 3.5–5.2)
Sodium: 138 mmol/L (ref 134–144)
Total Protein: 6.8 g/dL (ref 6.0–8.5)

## 2020-08-31 ENCOUNTER — Other Ambulatory Visit: Payer: Self-pay | Admitting: Family Medicine

## 2020-08-31 DIAGNOSIS — K219 Gastro-esophageal reflux disease without esophagitis: Secondary | ICD-10-CM

## 2020-08-31 DIAGNOSIS — G40909 Epilepsy, unspecified, not intractable, without status epilepticus: Secondary | ICD-10-CM

## 2020-09-07 ENCOUNTER — Other Ambulatory Visit: Payer: Self-pay | Admitting: Family Medicine

## 2020-09-07 DIAGNOSIS — K580 Irritable bowel syndrome with diarrhea: Secondary | ICD-10-CM

## 2020-12-01 ENCOUNTER — Other Ambulatory Visit: Payer: Self-pay | Admitting: Family Medicine

## 2020-12-01 DIAGNOSIS — K219 Gastro-esophageal reflux disease without esophagitis: Secondary | ICD-10-CM

## 2020-12-01 DIAGNOSIS — G40909 Epilepsy, unspecified, not intractable, without status epilepticus: Secondary | ICD-10-CM

## 2021-02-23 ENCOUNTER — Other Ambulatory Visit: Payer: Self-pay | Admitting: Family Medicine

## 2021-02-23 DIAGNOSIS — G40909 Epilepsy, unspecified, not intractable, without status epilepticus: Secondary | ICD-10-CM

## 2021-02-23 DIAGNOSIS — K219 Gastro-esophageal reflux disease without esophagitis: Secondary | ICD-10-CM

## 2021-02-24 MED ORDER — DEPAKOTE 500 MG PO TBEC: 1000.0000 mg | DELAYED_RELEASE_TABLET | Freq: Two times a day (BID) | ORAL | 0 refills | Status: DC

## 2021-02-24 MED ORDER — PANTOPRAZOLE SODIUM 40 MG PO TBEC: 1.0000 | DELAYED_RELEASE_TABLET | Freq: Every day | ORAL | 0 refills | Status: DC

## 2021-02-24 MED ORDER — ZONISAMIDE 100 MG PO CAPS: 100.0000 mg | ORAL_CAPSULE | Freq: Every day | ORAL | 0 refills | Status: DC

## 2021-02-26 ENCOUNTER — Other Ambulatory Visit: Payer: Self-pay

## 2021-02-26 ENCOUNTER — Telehealth: Payer: Self-pay

## 2021-02-26 DIAGNOSIS — G40909 Epilepsy, unspecified, not intractable, without status epilepticus: Secondary | ICD-10-CM

## 2021-02-26 DIAGNOSIS — J069 Acute upper respiratory infection, unspecified: Secondary | ICD-10-CM

## 2021-02-26 MED ORDER — FLUTICASONE PROPIONATE 50 MCG/ACT NA SUSP
2.0000 | Freq: Every day | NASAL | 2 refills | Status: DC
Start: 2021-02-26 — End: 2021-05-04

## 2021-02-26 MED ORDER — CETIRIZINE HCL 10 MG PO TABS: 10.0000 mg | ORAL_TABLET | Freq: Every day | ORAL | 3 refills | Status: DC

## 2021-02-26 MED ORDER — TEGRETOL 200 MG PO TABS: ORAL_TABLET | ORAL | 1 refills | Status: DC

## 2021-02-26 NOTE — Telephone Encounter (Signed)
Pt called to get refills on her medicines, especially her seizure medicines. Explained to pt that she is due for an appt. Pt scheduled appt with Dr Lajuana Ripple on 05/04/21 (first available) and pt needs refills called in to last her until her appt.  Please advise and call patient.

## 2021-03-02 ENCOUNTER — Other Ambulatory Visit: Payer: Self-pay | Admitting: Family Medicine

## 2021-03-02 DIAGNOSIS — G40909 Epilepsy, unspecified, not intractable, without status epilepticus: Secondary | ICD-10-CM

## 2021-03-07 ENCOUNTER — Other Ambulatory Visit: Payer: Self-pay | Admitting: Family Medicine

## 2021-03-07 DIAGNOSIS — K580 Irritable bowel syndrome with diarrhea: Secondary | ICD-10-CM

## 2021-03-08 ENCOUNTER — Telehealth: Payer: Self-pay | Admitting: *Deleted

## 2021-03-08 ENCOUNTER — Other Ambulatory Visit: Payer: Self-pay | Admitting: Family Medicine

## 2021-03-08 DIAGNOSIS — K580 Irritable bowel syndrome with diarrhea: Secondary | ICD-10-CM

## 2021-03-08 MED ORDER — VIBERZI 100 MG PO TABS: 1.0000 | ORAL_TABLET | Freq: Two times a day (BID) | ORAL | 1 refills | Status: DC

## 2021-03-08 NOTE — Telephone Encounter (Signed)
PA attempted for Viberzi it states Available without authorization.  Pharmacy and patient called

## 2021-03-08 NOTE — Telephone Encounter (Signed)
Approval is fine.

## 2021-03-09 ENCOUNTER — Other Ambulatory Visit: Payer: Self-pay | Admitting: Family Medicine

## 2021-03-09 DIAGNOSIS — K580 Irritable bowel syndrome with diarrhea: Secondary | ICD-10-CM

## 2021-03-09 MED ORDER — VIBERZI 100 MG PO TABS: 1.0000 | ORAL_TABLET | Freq: Two times a day (BID) | ORAL | 1 refills | Status: DC

## 2021-05-04 ENCOUNTER — Other Ambulatory Visit: Payer: Self-pay

## 2021-05-04 ENCOUNTER — Ambulatory Visit: Payer: Medicaid Other | Admitting: Family Medicine

## 2021-05-04 ENCOUNTER — Encounter: Payer: Self-pay | Admitting: Family Medicine

## 2021-05-04 VITALS — BP 135/78 | HR 69 | Temp 97.8°F | Ht 65.0 in | Wt 152.6 lb

## 2021-05-04 DIAGNOSIS — E782 Mixed hyperlipidemia: Secondary | ICD-10-CM | POA: Diagnosis not present

## 2021-05-04 DIAGNOSIS — G40909 Epilepsy, unspecified, not intractable, without status epilepticus: Secondary | ICD-10-CM | POA: Diagnosis not present

## 2021-05-04 DIAGNOSIS — K580 Irritable bowel syndrome with diarrhea: Secondary | ICD-10-CM

## 2021-05-04 DIAGNOSIS — K219 Gastro-esophageal reflux disease without esophagitis: Secondary | ICD-10-CM | POA: Diagnosis not present

## 2021-05-04 DIAGNOSIS — J301 Allergic rhinitis due to pollen: Secondary | ICD-10-CM

## 2021-05-04 DIAGNOSIS — F419 Anxiety disorder, unspecified: Secondary | ICD-10-CM

## 2021-05-04 DIAGNOSIS — F439 Reaction to severe stress, unspecified: Secondary | ICD-10-CM

## 2021-05-04 MED ORDER — FLUTICASONE PROPIONATE 50 MCG/ACT NA SUSP: 2.0000 | Freq: Every day | NASAL | 12 refills | Status: DC

## 2021-05-04 MED ORDER — CETIRIZINE HCL 10 MG PO TABS
10.0000 mg | ORAL_TABLET | Freq: Every day | ORAL | 3 refills | Status: DC
Start: 2021-05-04 — End: 2021-10-12

## 2021-05-04 MED ORDER — TEGRETOL 200 MG PO TABS: ORAL_TABLET | ORAL | 3 refills | Status: DC

## 2021-05-04 MED ORDER — ZONISAMIDE 100 MG PO CAPS: 100.0000 mg | ORAL_CAPSULE | Freq: Every day | ORAL | 3 refills | Status: DC

## 2021-05-04 MED ORDER — HYDROXYZINE HCL 25 MG PO TABS: 12.5000 mg | ORAL_TABLET | Freq: Three times a day (TID) | ORAL | 3 refills | Status: DC | PRN

## 2021-05-04 MED ORDER — DIVALPROEX SODIUM 500 MG PO DR TAB: 1000.0000 mg | DELAYED_RELEASE_TABLET | Freq: Two times a day (BID) | ORAL | 3 refills | Status: DC

## 2021-05-04 MED ORDER — VIBERZI 100 MG PO TABS: 1.0000 | ORAL_TABLET | Freq: Two times a day (BID) | ORAL | 5 refills | Status: DC

## 2021-05-04 MED ORDER — PANTOPRAZOLE SODIUM 40 MG PO TBEC: 40.0000 mg | DELAYED_RELEASE_TABLET | Freq: Every day | ORAL | 3 refills | Status: DC

## 2021-05-04 NOTE — Progress Notes (Signed)
Subjective: CC:f/u seizure disorder, IBS-D PCP: Janora Norlander, DO VFI:EPPIR Savannah Shaw is a 60 y.o. female presenting to clinic today for:  1. Seizure disorder She has remained seizure-free even though she had to switch from brand-name Depakote to generic due to insurance issues.  She has been compliant with all medications.  She needs refills on all.  2.  IBS-D, GERD Patient continues to use Viberzi daily for control of symptoms.  She occasionally gets constipated but this is well managed with OTC products.  Sometimes she only takes it once per day if she is only eating 1 meal per day.  GERD is well managed with Protonix and needs refills on that  3.  Mood disorder Patient reports that she becomes easily angered sometimes when she is having situational anxiety.  She identifies a family member who is staying with them that causes these outbursts.  She is not currently taking Atarax for it but would be willing to try it as symptoms seem to be exacerbated only over the last few weeks.   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, controlled [facility]) neurologist-  dr Krista Blue ([facility])   last seizure 2008  (dx age 48)   History of acute pyelonephritis    w/ urosepsis 12-31-2015  resolved   History of kidney stones    Hyperlipidemia    Left ureteral stone    Normal exercise tolerance test results    12-24-2015 (dr hochrein)  no chest pain, no signficant arrhythmias , 85% max heartrate achieved    Current Outpatient Medications:    cetirizine (ZYRTEC) 10 MG tablet, Take 1 tablet (10 mg total) by mouth at bedtime., Disp: 30 tablet, Rfl: 3   divalproex (DEPAKOTE) 500 MG DR tablet, TAKE 2 TABLETS BY MOUTH TWICE DAILY, Disp: 360 tablet, Rfl: 0   Eluxadoline (VIBERZI) 100 MG TABS, Take 1 tablet (100 mg total) by  mouth 2 (two) times daily., Disp: 60 tablet, Rfl: 1   fish oil-omega-3 fatty acids 1000 MG capsule, Take 1 capsule by mouth daily.  , Disp: , Rfl:    fluticasone (FLONASE) 50 MCG/ACT nasal spray, Place 2 sprays into both nostrils daily., Disp: 16 g, Rfl: 2   pantoprazole (PROTONIX) 40 MG tablet, Take 1 tablet (40 mg total) by mouth daily., Disp: 90 tablet, Rfl: 0   TEGRETOL 200 MG tablet, TAKE 1 & 1/2 (ONE & ONE-HALF) TABLETS BY MOUTH IN THE MORNING AND 1 WITH LUNCH AND 1 & 1/2 IN THE EVENING, Disp: 360 tablet, Rfl: 1   zonisamide (ZONEGRAN) 100 MG capsule, Take 1 capsule (100 mg total) by mouth daily., Disp: 90 capsule, Rfl: 0 Social History   Socioeconomic History   Marital status: Single    Spouse name: Not on file   Number of children: Not on file   Years of education: Not on file   Highest education level: Not on file  Occupational History   Occupation: Disabled    Comment: Seizures  Tobacco Use   Smoking status: Former    Packs/day: 1.00    Years: 5.00    Pack years: 5.00    Types: Cigarettes    Quit date: 01/11/2011    Years since quitting: 10.3   Smokeless tobacco: Never  Vaping Use   Vaping Use:  Some days  Substance and Sexual Activity   Alcohol use: No   Drug use: No   Sexual activity: Not on file  Other Topics Concern   Not on file  Social History Narrative   Daily Caffeine use - 3   Social Determinants of Health   Financial Resource Strain: Not on file  Food Insecurity: Not on file  Transportation Needs: Not on file  Physical Activity: Not on file  Stress: Not on file  Social Connections: Not on file  Intimate Partner Violence: Not on file   Family History  Problem Relation Age of Onset   Diabetes Mother    Heart disease Mother 44       No details about the type of heart disease.     Prostate cancer Other    Colon cancer Neg Hx     Objective: Office vital signs reviewed. BP 135/78   Pulse 69   Temp 97.8 F (36.6 C)   Ht _0  (1.651 m)   Wt  152 lb 9.6 oz (69.2 kg)   SpO2 100%   BMI 25.39 kg/m   Physical Examination:  General: Awake, alert, well nourished, No acute distress HEENT: Normal, sclera white Cardio: regular rate and rhythm, S1S2 heard, no murmurs appreciated Pulm: clear to auscultation bilaterally, no wheezes, rhonchi or rales; normal work of breathing on room air GI: soft, non-tender, non-distended, bowel sounds present x4, no hepatomegaly, no splenomegaly, no masses Skin: dry; intact; no rashes or lesions Neuro: no tremor Psych: Mood stable, speech normal  Depression screen [facility] 2/9 05/04/2021 08/18/2020 02/18/2020  Decreased Interest 1 0 0  Down, Depressed, Hopeless 2 0 0  PHQ - 2 Score 3 0 0  Altered sleeping 1 - 0  Tired, decreased energy 1 - 0  Change in appetite 0 - 0  Feeling bad or failure about yourself  0 - 0  Trouble concentrating 0 - 0  Moving slowly or fidgety/restless 1 - 0  Suicidal thoughts 0 - 0  PHQ-9 Score 6 - 0  Difficult doing work/chores Somewhat difficult - -  Some recent data might be hidden   GAD 7 : Generalized Anxiety Score 05/04/2021 06/12/2019  Nervous, Anxious, on Edge 1 1  Control/stop worrying 2 1  Worry too much - different things 2 1  Trouble relaxing 1 1  Restless 0 0  Easily annoyed or irritable 1 1  Afraid - awful might happen 0 1  Total GAD 7 Score 7 6  Anxiety Difficulty Somewhat difficult Somewhat difficult      Assessment/ Plan: 60 y.o. female   Seizure disorder (HCC) - Plan: CMP14+EGFR, CBC, Valproic acid level, divalproex (DEPAKOTE) 500 MG DR tablet, TEGRETOL 200 MG tablet, zonisamide (ZONEGRAN) 100 MG capsule  Mixed hyperlipidemia - Plan: LDL Cholesterol, Direct  Irritable bowel syndrome with diarrhea - Plan: CBC, TSH, Eluxadoline (VIBERZI) 100 MG TABS  Anxiety - Plan: hydrOXYzine (ATARAX/VISTARIL) 25 MG tablet  Stress at home - Plan: hydrOXYzine (ATARAX/VISTARIL) 25 MG tablet  Non-seasonal allergic rhinitis due to pollen - Plan: cetirizine (ZYRTEC)  10 MG tablet  Gastroesophageal reflux disease without esophagitis - Plan: pantoprazole (PROTONIX) 40 MG tablet  Continue current medications.  Apparently Depakote brand was no longer covered by insurance as she is on generic.  Check Depakote level, CMP and CBC  Given nonfasting status direct LDL obtained with after mentioned labs  IBS-D is well controlled with daily use of Viberzi.  Trial of Atarax for anxiety.  Will CC chart to  pharmacy.  Considering SNRI versus SSRI but want to make sure that these will not have significant drug interactions with current seizure meds  GERD is well controlled with pantoprazole.  This has been renewed   No orders of the defined types were placed in this encounter.  No orders of the defined types were placed in this encounter.    Janora Norlander, DO [place] [phone]

## 2021-05-05 LAB — CBC
Hematocrit: 38.4 % (ref 34.0–46.6)
Hemoglobin: 13 g/dL (ref 11.1–15.9)
MCH: 33.8 pg — ABNORMAL HIGH (ref 26.6–33.0)
MCHC: 33.9 g/dL (ref 31.5–35.7)
MCV: 100 fL — ABNORMAL HIGH (ref 79–97)
Platelets: 233 10*3/uL (ref 150–450)
RBC: 3.85 x10E6/uL (ref 3.77–5.28)
RDW: 12.7 % (ref 11.7–15.4)
WBC: 4 10*3/uL (ref 3.4–10.8)

## 2021-05-05 LAB — LDL CHOLESTEROL, DIRECT: LDL Direct: 135 mg/dL — ABNORMAL HIGH (ref 0–99)

## 2021-05-05 LAB — CMP14+EGFR
ALT: 10 IU/L (ref 0–32)
AST: 23 IU/L (ref 0–40)
Albumin/Globulin Ratio: 1.9 (ref 1.2–2.2)
Albumin: 4.4 g/dL (ref 3.8–4.9)
Alkaline Phosphatase: 62 IU/L (ref 44–121)
BUN/Creatinine Ratio: 15 (ref 12–28)
BUN: 10 mg/dL (ref 8–27)
Bilirubin Total: <0.2 mg/dL (ref 0.0–1.2)
CO2: 23 mmol/L (ref 20–29)
Calcium: 8.9 mg/dL (ref 8.7–10.3)
Chloride: 100 mmol/L (ref 96–106)
Creatinine, Ser: 0.65 mg/dL (ref 0.57–1.00)
Globulin, Total: 2.3 g/dL (ref 1.5–4.5)
Glucose: 90 mg/dL (ref 65–99)
Potassium: 4.8 mmol/L (ref 3.5–5.2)
Sodium: 138 mmol/L (ref 134–144)
Total Protein: 6.7 g/dL (ref 6.0–8.5)
eGFR: 101 mL/min/{1.73_m2} (ref >59)

## 2021-05-05 LAB — TSH: TSH: 2.03 u[IU]/mL (ref 0.450–4.500)

## 2021-05-05 LAB — VALPROIC ACID LEVEL: Valproic Acid Lvl: 70 ug/mL (ref 50–100)

## 2021-05-14 ENCOUNTER — Encounter: Payer: Self-pay | Admitting: *Deleted

## 2021-05-20 NOTE — Telephone Encounter (Signed)
Per Savannah Shaw with Prolia, since patient has [facility], send Prolia RX to pharmacy and have patient pick up.

## 2021-06-09 NOTE — Progress Notes (Signed)
Checking benefits

## 2021-06-18 ENCOUNTER — Other Ambulatory Visit: Payer: Self-pay | Admitting: Family Medicine

## 2021-06-18 DIAGNOSIS — K219 Gastro-esophageal reflux disease without esophagitis: Secondary | ICD-10-CM

## 2021-07-08 ENCOUNTER — Ambulatory Visit: Payer: Medicaid Other | Admitting: Nurse Practitioner

## 2021-07-08 ENCOUNTER — Other Ambulatory Visit: Payer: Self-pay

## 2021-07-08 ENCOUNTER — Encounter: Payer: Self-pay | Admitting: Nurse Practitioner

## 2021-07-08 VITALS — BP 131/74 | HR 71 | Temp 97.7°F | Resp 20 | Ht 65.0 in | Wt 154.0 lb

## 2021-07-08 DIAGNOSIS — M79671 Pain in right foot: Secondary | ICD-10-CM | POA: Diagnosis not present

## 2021-07-08 DIAGNOSIS — Z23 Encounter for immunization: Secondary | ICD-10-CM | POA: Diagnosis not present

## 2021-07-08 MED ORDER — PREDNISONE 20 MG PO TABS: 40.0000 mg | ORAL_TABLET | Freq: Every day | ORAL | 0 refills | Status: AC

## 2021-07-08 NOTE — Progress Notes (Signed)
   Subjective:    Patient ID: Savannah Shaw, female    DOB: October 20, 1960, 60 y.o.   MRN: [id number]  Chief Complaint: Foot Injury (Old - right - sore to touch and swelling )   HPI Patient comes in c/o of right foot pain. She said she fractured her right ankle several years ago , but this is more her foot that hurts and swells. Rates pain 8/10 when standing or walking.    Review of Systems  Constitutional:  Negative for diaphoresis.  Eyes:  Negative for pain.  Respiratory:  Negative for shortness of breath.   Cardiovascular:  Negative for chest pain, palpitations and leg swelling.  Gastrointestinal:  Negative for abdominal pain.  Endocrine: Negative for polydipsia.  Skin:  Negative for rash.  Neurological:  Negative for dizziness, weakness and headaches.  Hematological:  Does not bruise/bleed easily.  All other systems reviewed and are negative.     Objective:   Physical Exam Constitutional:      Appearance: Normal appearance.  Cardiovascular:     Rate and Rhythm: Normal rate and regular rhythm.     Heart sounds: Normal heart sounds.  Pulmonary:     Effort: Pulmonary effort is normal.     Breath sounds: Normal breath sounds.  Musculoskeletal:     Comments: Mild edema on top of right foot with pain on palpation along 1st metatarsal  Skin:    General: Skin is warm.  Neurological:     General: No focal deficit present.     Mental Status: She is alert and oriented to person, place, and time.  Psychiatric:        Mood and Affect: Mood normal.        Behavior: Behavior normal.    BP 131/74   Pulse 71   Temp 97.7 F (36.5 C)   Resp 20   Ht 5\' 5"  (1.651 m)   Wt 154 lb (69.9 kg)   SpO2 100%   BMI 25.63 kg/m        Assessment & Plan:  Savannah Shaw comes in today with chief complaint of Foot Injury (Old - right - sore to touch and swelling )   Diagnosis and orders addressed:  1. Right foot pain Labs pending Elevate when sitting - Arthritis Panel - predniSONE  (DELTASONE) 20 MG tablet; Take 2 tablets (40 mg total) by mouth daily with breakfast for 5 days. 2 po daily for 5 days  Dispense: 10 tablet; Refill: 0   Labs pending Health Maintenance reviewed Diet and exercise encouraged  Follow up plan: prn   Mary-Margaret Hassell Done, FNP

## 2021-07-08 NOTE — Patient Instructions (Signed)
Foot Pain °Many things can cause foot pain. Some common causes are: °An injury. °A sprain. °Arthritis. °Blisters. °Bunions. °Follow these instructions at home: °Managing pain, stiffness, and swelling °If directed, put ice on the painful area: °Put ice in a plastic bag. °Place a towel between your skin and the bag. °Leave the ice on for 20 minutes, 2-3 times a day. ° °Activity °Do not stand or walk for long periods. °Return to your normal activities as told by your health care provider. Ask your health care provider what activities are safe for you. °Do stretches to relieve foot pain and stiffness as told by your health care provider. °Do not lift anything that is heavier than 10 lb (4.5 kg), or the limit that you are told, until your health care provider says that it is safe. Lifting a lot of weight can put added pressure on your feet. °Lifestyle °Wear comfortable, supportive shoes that fit you well. Do not wear high heels. °Keep your feet clean and dry. °General instructions °Take over-the-counter and prescription medicines only as told by your health care provider. °Rub your foot gently. °Pay attention to any changes in your symptoms. °Keep all follow-up visits as told by your health care provider. This is important. °Contact a health care provider if: °Your pain does not get better after a few days of self-care. °Your pain gets worse. °You cannot stand on your foot. °Get help right away if: °Your foot is numb or tingling. °Your foot or toes are swollen. °Your foot or toes turn white or blue. °You have warmth and redness along your foot. °Summary °Common causes of foot pain are injury, sprain, arthritis, blisters, or bunions. °Ice, medicines, and comfortable shoes may help foot pain. °Contact your health care provider if your pain does not get better after a few days of self-care. °This information is not intended to replace advice given to you by your health care provider. Make sure you discuss any questions you  have with your health care provider. °Document Revised: 01/06/2021 Document Reviewed: 01/06/2021 °Elsevier Patient Education © 2022 Elsevier Inc. ° °

## 2021-07-09 ENCOUNTER — Other Ambulatory Visit: Payer: Self-pay | Admitting: *Deleted

## 2021-07-09 LAB — ARTHRITIS PANEL
Basophils Absolute: 0.1 10*3/uL (ref 0.0–0.2)
Basos: 1 %
EOS (ABSOLUTE): 0.1 10*3/uL (ref 0.0–0.4)
Eos: 1 %
Hematocrit: 38.5 % (ref 34.0–46.6)
Hemoglobin: 13.3 g/dL (ref 11.1–15.9)
Immature Grans (Abs): 0 10*3/uL (ref 0.0–0.1)
Immature Granulocytes: 0 %
Lymphocytes Absolute: 2.2 10*3/uL (ref 0.7–3.1)
Lymphs: 46 %
MCH: 33.7 pg — ABNORMAL HIGH (ref 26.6–33.0)
MCHC: 34.5 g/dL (ref 31.5–35.7)
MCV: 98 fL — ABNORMAL HIGH (ref 79–97)
Monocytes Absolute: 0.5 10*3/uL (ref 0.1–0.9)
Monocytes: 10 %
Neutrophils Absolute: 2 10*3/uL (ref 1.4–7.0)
Neutrophils: 42 %
Platelets: 265 10*3/uL (ref 150–450)
RBC: 3.95 x10E6/uL (ref 3.77–5.28)
RDW: 12.4 % (ref 11.7–15.4)
Rheumatoid fact SerPl-aCnc: <10 IU/mL (ref <14.0)
Sed Rate: 2 mm/hr (ref 0–40)
Uric Acid: 4 mg/dL (ref 3.0–7.2)
WBC: 4.8 10*3/uL (ref 3.4–10.8)

## 2021-07-09 MED ORDER — DENOSUMAB 60 MG/ML ~~LOC~~ SOSY: 60.0000 mg | PREFILLED_SYRINGE | Freq: Once | SUBCUTANEOUS | 1 refills | Status: AC

## 2021-07-09 NOTE — Telephone Encounter (Signed)
I do not see Prolia on patients medication list. Would you like patient to continue? Please review and send back to prolia pool

## 2021-07-26 ENCOUNTER — Telehealth: Payer: Self-pay | Admitting: *Deleted

## 2021-07-26 DIAGNOSIS — M810 Age-related osteoporosis without current pathological fracture: Secondary | ICD-10-CM

## 2021-07-26 NOTE — Telephone Encounter (Signed)
[facility] [employer] [place] Form [phone] NCPDP)  Key: [id number] Prolia 60MG /ML syringes Sent to Plan today

## 2021-07-26 NOTE — Telephone Encounter (Signed)
A Case: [id number], Status: Approved, Coverage Starts on: 07/26/2021 12:00:00 AM, Coverage Ends on: 07/26/2022 12:00:00 AM  WM called and aware  Pt called and aware of approval - aware to call

## 2021-07-28 ENCOUNTER — Ambulatory Visit: Payer: Medicaid Other

## 2021-07-29 ENCOUNTER — Ambulatory Visit (INDEPENDENT_AMBULATORY_CARE_PROVIDER_SITE_OTHER): Payer: Medicaid Other

## 2021-07-29 ENCOUNTER — Other Ambulatory Visit: Payer: Self-pay

## 2021-07-29 DIAGNOSIS — M810 Age-related osteoporosis without current pathological fracture: Secondary | ICD-10-CM

## 2021-07-29 MED ORDER — DENOSUMAB 60 MG/ML ~~LOC~~ SOSY
60.0000 mg | PREFILLED_SYRINGE | Freq: Once | SUBCUTANEOUS | Status: AC
  Administered 2021-07-29: 60 mg via SUBCUTANEOUS

## 2021-07-29 NOTE — Progress Notes (Signed)
Prolia injection given to left arm Patient tolerated well Patient supplied

## 2021-10-04 ENCOUNTER — Ambulatory Visit: Payer: Medicaid Other | Admitting: Family Medicine

## 2021-10-12 ENCOUNTER — Encounter: Payer: Self-pay | Admitting: Nurse Practitioner

## 2021-10-12 ENCOUNTER — Ambulatory Visit: Payer: Medicaid Other | Admitting: Family Medicine

## 2021-10-12 ENCOUNTER — Ambulatory Visit: Payer: Medicaid Other | Admitting: Nurse Practitioner

## 2021-10-12 ENCOUNTER — Telehealth: Payer: Self-pay | Admitting: *Deleted

## 2021-10-12 VITALS — BP 128/78 | HR 70 | Temp 98.0°F | Resp 20 | Ht 65.0 in | Wt 157.0 lb

## 2021-10-12 DIAGNOSIS — G40909 Epilepsy, unspecified, not intractable, without status epilepticus: Secondary | ICD-10-CM | POA: Diagnosis not present

## 2021-10-12 DIAGNOSIS — R4589 Other symptoms and signs involving emotional state: Secondary | ICD-10-CM

## 2021-10-12 DIAGNOSIS — M810 Age-related osteoporosis without current pathological fracture: Secondary | ICD-10-CM

## 2021-10-12 DIAGNOSIS — M25562 Pain in left knee: Secondary | ICD-10-CM | POA: Diagnosis not present

## 2021-10-12 DIAGNOSIS — K580 Irritable bowel syndrome with diarrhea: Secondary | ICD-10-CM

## 2021-10-12 DIAGNOSIS — E782 Mixed hyperlipidemia: Secondary | ICD-10-CM

## 2021-10-12 DIAGNOSIS — K219 Gastro-esophageal reflux disease without esophagitis: Secondary | ICD-10-CM | POA: Diagnosis not present

## 2021-10-12 DIAGNOSIS — D529 Folate deficiency anemia, unspecified: Secondary | ICD-10-CM

## 2021-10-12 DIAGNOSIS — M25561 Pain in right knee: Secondary | ICD-10-CM

## 2021-10-12 DIAGNOSIS — J301 Allergic rhinitis due to pollen: Secondary | ICD-10-CM | POA: Diagnosis not present

## 2021-10-12 DIAGNOSIS — E663 Overweight: Secondary | ICD-10-CM

## 2021-10-12 MED ORDER — PREDNISONE 20 MG PO TABS: 40.0000 mg | ORAL_TABLET | Freq: Every day | ORAL | 0 refills | Status: AC

## 2021-10-12 MED ORDER — DICLOFENAC SODIUM 75 MG PO TBEC: 75.0000 mg | DELAYED_RELEASE_TABLET | Freq: Two times a day (BID) | ORAL | 1 refills | Status: DC

## 2021-10-12 MED ORDER — VIBERZI 100 MG PO TABS: 1.0000 | ORAL_TABLET | Freq: Two times a day (BID) | ORAL | 5 refills | Status: DC

## 2021-10-12 MED ORDER — PANTOPRAZOLE SODIUM 40 MG PO TBEC: 40.0000 mg | DELAYED_RELEASE_TABLET | Freq: Every day | ORAL | 1 refills | Status: DC

## 2021-10-12 MED ORDER — CETIRIZINE HCL 10 MG PO TABS: 10.0000 mg | ORAL_TABLET | Freq: Every day | ORAL | 3 refills | Status: DC

## 2021-10-12 NOTE — Telephone Encounter (Signed)
Wetzel Bjornstad Key: [id number] - [place] Case ID: [id number] - Rx #: [id number] Need help? Call us at [phone] Outcome Approvedtoday [place] Case: [id number], Status: Approved, Coverage Starts on: 10/12/2021 12:00:00 AM, Coverage Ends on: 10/12/2022 12:00:00 AM. Drug Diclofenac Sodium 75MG  dr tablets Form  Sent to [employer]

## 2021-10-12 NOTE — Telephone Encounter (Signed)
Wetzel Bjornstad Key: [id number] - [place] Case ID: [id number] - Rx #: [id number] Need help? Call us at [phone] Status Sent to [facility] Diclofenac Sodium 75MG  dr tablets Form [facility] Electronic [place] Form [phone] NCPDP) Original Claim Info [phone]

## 2021-10-12 NOTE — Patient Instructions (Signed)
Acute Knee Pain, Adult °Many things can cause knee pain. Sometimes, knee pain is sudden (acute) and may be caused by damage, swelling, or irritation of the muscles and tissues that support your knee. °The pain often goes away on its own with time and rest. If the pain does not go away, tests may be done to find out what is causing the pain. °Follow these instructions at home: °If you have a knee sleeve or brace: ° °Wear the knee sleeve or brace as told by your doctor. Take it off only as told by your doctor. °Loosen it if your toes: °Tingle. °Become numb. °Turn cold and blue. °Keep it clean. °If the knee sleeve or brace is not waterproof: °Do not let it get wet. °Cover it with a watertight covering when you take a bath or shower. °Activity °Rest your knee. °Do not do things that cause pain or make pain worse. °Avoid activities where both feet leave the ground at the same time (high-impact activities). Examples are running, jumping rope, and doing jumping jacks. °Work with a physical therapist to make a safe exercise program, as told by your doctor. °Managing pain, stiffness, and swelling ° °If told, put ice on the knee. To do this: °If you have a removable knee sleeve or brace, take it off as told by your doctor. °Put ice in a plastic bag. °Place a towel between your skin and the bag. °Leave the ice on for 20 minutes, 2-3 times a day. °Take off the ice if your skin turns bright red. This is very important. If you cannot feel pain, heat, or cold, you have a greater risk of damage to the area. °If told, use an elastic bandage to put pressure (compression) on your injured knee. °Raise your knee above the level of your heart while you are sitting or lying down. °Sleep with a pillow under your knee. °General instructions °Take over-the-counter and prescription medicines only as told by your doctor. °Do not smoke or use any products that contain nicotine or tobacco. If you need help quitting, ask your doctor. °If you are  overweight, work with your doctor and a food expert (dietitian) to set goals to lose weight. Being overweight can make your knee hurt more. °Watch for any changes in your symptoms. °Keep all follow-up visits. °Contact a doctor if: °The knee pain does not stop. °The knee pain changes or gets worse. °You have a fever along with knee pain. °Your knee is red or feels warm when you touch it. °Your knee gives out or locks up. °Get help right away if: °Your knee swells, and the swelling gets worse. °You cannot move your knee. °You have very bad knee pain that does not get better with pain medicine. °Summary °Many things can cause knee pain. The pain often goes away on its own with time and rest. °Your doctor may do tests to find out the cause of the pain. °Watch for any changes in your symptoms. Relieve your pain with rest, medicines, light activity, and use of ice. °Get help right away if you cannot move your knee or your knee pain is very bad. °This information is not intended to replace advice given to you by your health care provider. Make sure you discuss any questions you have with your health care provider. °Document Revised: 03/18/2020 Document Reviewed: 03/18/2020 °Elsevier Patient Education © 2022 Elsevier Inc. ° °

## 2021-10-12 NOTE — Progress Notes (Signed)
Subjective:    Patient ID: Savannah Shaw, female    DOB: 1961/02/17, 60 y.o.   MRN: [id number]   Chief Complaint: medical management of chronic issues     HPI:  Savannah Shaw is a 60 y.o. who identifies as a female who was assigned female at birth.   Social history: Lives with: her om lives with her Work history: disability   Comes in today for follow up of the following chronic medical issues:  1. Mixed hyperlipidemia Does not watch diet and does no dedicated exercise. Has refused statin in the past Lab Results  Component Value Date   CHOL 223 (H) 02/18/2020   HDL 58 02/18/2020   LDLCALC 144 (H) 02/18/2020   LDLDIRECT 135 (H) 05/04/2021   TRIG 120 02/18/2020   CHOLHDL 3.8 02/18/2020  The 10-year ASCVD risk score (Arnett DK, et al., 2019) is: 3.5%    2. Gastroesophageal reflux disease, unspecified whether esophagitis present Is on protonix daily and is doing well.  3. Anemia due to folic acid deficiency, unspecified deficiency type Lab Results  Component Value Date   HGB 13.3 07/08/2021     4. Seizure disorder ([place]) Has not had any seizure activity in several years. Still takes her depakote, trgretol and zonegran. Sees neurology yearly.  5. Depressed mood Doing well. Depression screen [facility] 2/9 10/12/2021 07/08/2021 05/04/2021  Decreased Interest 1 0 1  Down, Depressed, Hopeless 0 1 2  PHQ - 2 Score '1 1 3  ' Altered sleeping 0 - 1  Tired, decreased energy 1 - 1  Change in appetite 0 - 0  Feeling bad or failure about yourself  0 - 0  Trouble concentrating 0 - 0  Moving slowly or fidgety/restless 0 - 1  Suicidal thoughts 0 - 0  PHQ-9 Score 2 - 6  Difficult doing work/chores Not difficult at all - Somewhat difficult  Some recent data might be hidden     6. Age-related osteoporosis without current pathological fracture Last dexascan was done 08/18/20. T score was -2.9. she is on prolia every 6 months  7. IBS  She is still on viberzi daily and is doing well.  Has occcasional diarrhea.  8. Overweight (BMI 25.0-29.9) Weight is up 3 lbs. Wt Readings from Last 3 Encounters:  10/12/21 157 lb (71.2 kg)  07/08/21 154 lb (69.9 kg)  05/04/21 152 lb 9.6 oz (69.2 kg)   BMI Readings from Last 3 Encounters:  10/12/21 26.13 kg/m  07/08/21 25.63 kg/m  05/04/21 25.39 kg/m     New complaints: - bil knee pain- but has been going on for several months. Pain is worse when walking. Voltaren gel in past does not help. - has had congestion for over a month, has lots of phlegm. She I son zyrtec daily.   Allergies  Allergen Reactions   Terbinafine Hcl Rash    Lamasil   Topamax [Topiramate] Rash   Outpatient Encounter Medications as of 10/12/2021  Medication Sig   cetirizine (ZYRTEC) 10 MG tablet Take 1 tablet (10 mg total) by mouth at bedtime.   divalproex (DEPAKOTE) 500 MG DR tablet Take 2 tablets (1,000 mg total) by mouth 2 (two) times daily.   Eluxadoline (VIBERZI) 100 MG TABS Take 1 tablet (100 mg total) by mouth 2 (two) times daily.   fish oil-omega-3 fatty acids 1000 MG capsule Take 1 capsule by mouth daily.   (Patient not taking: Reported on 07/08/2021)   fluticasone (FLONASE) 50 MCG/ACT nasal spray Place 2 sprays  into both nostrils daily.   hydrOXYzine (ATARAX/VISTARIL) 25 MG tablet Take 0.5-1 tablets (12.5-25 mg total) by mouth every 8 (eight) hours as needed for anxiety.   pantoprazole (PROTONIX) 40 MG tablet Take 1 tablet by mouth once daily   TEGRETOL 200 MG tablet TAKE 1 & 1/2 (ONE & ONE-HALF) TABLETS BY MOUTH IN THE MORNING AND 1 WITH LUNCH AND 1 & 1/2 IN THE EVENING   zonisamide (ZONEGRAN) 100 MG capsule Take 1 capsule (100 mg total) by mouth daily.   No facility-administered encounter medications on file as of 10/12/2021.    Past Surgical History:  Procedure Laterality Date   APPENDECTOMY     CARDIOVASCULAR STRESS TEST  07-20-2011   normal nuclear study/  no ischemia, normal LV function and wall motion, ef 76%   CHOLECYSTECTOMY OPEN   1970's   w/  Appendectomy   CYSTOSCOPY W/ URETERAL STENT PLACEMENT Left 12/31/2015   Procedure: CYSTOSCOPY WITH RETROGRADE PYELOGRAM/URETERAL STENT PLACEMENT;  Surgeon: Cleon Gustin, MD;  Location: [facility];  Service: Urology;  Laterality: Left;   CYSTOSCOPY W/ URETERAL STENT PLACEMENT Left 01/14/2016   Procedure: CYSTOSCOPY WITH STENT REPLACEMENT;  Surgeon: Cleon Gustin, MD;  Location[facility];  Service: Urology;  Laterality: Left;   CYSTOSCOPY/RETROGRADE/URETEROSCOPY/STONE EXTRACTION WITH BASKET Left 01/14/2016   Procedure: CYSTOSCOPY/RETROGRADE/URETEROSCOPY/STONE EXTRACTION WITH BASKET;  Surgeon: Cleon Gustin, MD;  Location: [facility];  Service: Urology;  Laterality: Left;   HOLMIUM LASER APPLICATION Left 02/19/3975   Procedure: HOLMIUM LASER APPLICATION;  Surgeon: Cleon Gustin, MD;  Location: [facility];  Service: Urology;  Laterality: Left;   OOPHORECTOMY  1980's   Unilateral oophorectomy and  Bilateral Salpingectomy (tubal)   TONSILLECTOMY AND ADENOIDECTOMY  as child   TRANSTHORACIC ECHOCARDIOGRAM  07-20-2011   ef 55-65%/  trivial MR/  mild TR    Family History  Problem Relation Age of Onset   Diabetes Mother    Heart disease Mother 83       No details about the type of heart disease.     Prostate cancer Other    Colon cancer Neg Hx       Controlled substance contract: n/a     Review of Systems  Constitutional:  Negative for diaphoresis.  Eyes:  Negative for pain.  Respiratory:  Negative for shortness of breath.   Cardiovascular:  Negative for chest pain, palpitations and leg swelling.  Gastrointestinal:  Negative for abdominal pain.  Endocrine: Negative for polydipsia.  Skin:  Negative for rash.  Neurological:  Negative for dizziness, weakness and headaches.  Hematological:  Does not bruise/bleed easily.  All other systems reviewed and are negative.     Objective:   Physical Exam Vitals and  nursing note reviewed.  Constitutional:      General: She is not in acute distress.    Appearance: Normal appearance. She is well-developed.  HENT:     Head: Normocephalic.     Right Ear: Tympanic membrane normal.     Left Ear: Tympanic membrane normal.     Nose: Nose normal.     Mouth/Throat:     Mouth: Mucous membranes are moist.  Eyes:     Pupils: Pupils are equal, round, and reactive to light.  Neck:     Vascular: No carotid bruit or JVD.  Cardiovascular:     Rate and Rhythm: Normal rate and regular rhythm.     Heart sounds: Normal heart sounds.  Pulmonary:  Effort: Pulmonary effort is normal. No respiratory distress.     Breath sounds: Normal breath sounds. No wheezing or rales.     Comments: deep dry cough Chest:     Chest wall: No tenderness.  Abdominal:     General: Bowel sounds are normal. There is no distension or abdominal bruit.     Palpations: Abdomen is soft. There is no hepatomegaly, splenomegaly, mass or pulsatile mass.     Tenderness: There is no abdominal tenderness.  Musculoskeletal:        General: Normal range of motion.     Cervical back: Normal range of motion and neck supple.  Lymphadenopathy:     Cervical: No cervical adenopathy.  Skin:    General: Skin is warm and dry.  Neurological:     Mental Status: She is alert and oriented to person, place, and time.     Deep Tendon Reflexes: Reflexes are normal and symmetric.  Psychiatric:        Behavior: Behavior normal.        Thought Content: Thought content normal.        Judgment: Judgment normal.    BP 128/78    Pulse 70    Temp 98 F (36.7 C) (Temporal)    Resp 20    Ht '5\' 5"'  (1.651 m)    Wt 157 lb (71.2 kg)    SpO2 98%    BMI 26.13 kg/m        Assessment & Plan:  Savannah Shaw comes in today with chief complaint of Medical Management of Chronic Issues (Bilateral knee pain. Tried voltaren gel and it didn't work. Also red itchy area behind left knee)   Diagnosis and orders  addressed:  1. Mixed hyperlipidemia Low fat diet - CBC with Differential/Platelet - CMP14+EGFR - Lipid panel  2. Gastroesophageal reflux disease, unspecified whether esophagitis present Avoid spicy foods Do not eat 2 hours prior to bedtime - pantoprazole (PROTONIX) 40 MG tablet; Take 1 tablet (40 mg total) by mouth daily.  Dispense: 90 tablet; Refill: 1  3. Anemia due to folic acid deficiency, unspecified deficiency type Labs pending  4. Seizure disorder [facility]) Keep follow up with neurology  5. Depressed mood Stress management  6. Age-related osteoporosis without current pathological fracture Weight bearing exercises  7. Overweight (BMI 25.0-29.9) Discussed diet and exercise for person with BMI >25 Will recheck weight in 3-6 months   8. Irritable bowel syndrome with diarrhea Watch diet to prevent flare up - Eluxadoline (VIBERZI) 100 MG TABS; Take 1 tablet (100 mg total) by mouth 2 (two) times daily.  Dispense: 60 tablet; Refill: 5  10. Non-seasonal allergic rhinitis due to pollen Added flonase back - cetirizine (ZYRTEC) 10 MG tablet; Take 1 tablet (10 mg total) by mouth at bedtime.  Dispense: 90 tablet; Refill: 3 - predniSONE (DELTASONE) 20 MG tablet; Take 2 tablets (40 mg total) by mouth daily with breakfast for 5 days. 2 po daily for 5 days  Dispense: 10 tablet; Refill: 0  11. Acute pain of both knees Rest Moist heat - diclofenac (VOLTAREN) 75 MG EC tablet; Take 1 tablet (75 mg total) by mouth 2 (two) times daily.  Dispense: 180 tablet; Refill: 1   Labs pending Health Maintenance reviewed Diet and exercise encouraged  Follow up plan: 6 months   Mary-Margaret Hassell Done, FNP

## 2021-10-13 LAB — CBC WITH DIFFERENTIAL/PLATELET
Basophils Absolute: 0 10*3/uL (ref 0.0–0.2)
Basos: 1 %
EOS (ABSOLUTE): 0.1 10*3/uL (ref 0.0–0.4)
Eos: 2 %
Hematocrit: 40.6 % (ref 34.0–46.6)
Hemoglobin: 13.5 g/dL (ref 11.1–15.9)
Immature Grans (Abs): 0 10*3/uL (ref 0.0–0.1)
Immature Granulocytes: 0 %
Lymphocytes Absolute: 2.8 10*3/uL (ref 0.7–3.1)
Lymphs: 54 %
MCH: 32.8 pg (ref 26.6–33.0)
MCHC: 33.3 g/dL (ref 31.5–35.7)
MCV: 99 fL — ABNORMAL HIGH (ref 79–97)
Monocytes Absolute: 0.5 10*3/uL (ref 0.1–0.9)
Monocytes: 10 %
Neutrophils Absolute: 1.7 10*3/uL (ref 1.4–7.0)
Neutrophils: 33 %
Platelets: 219 10*3/uL (ref 150–450)
RBC: 4.11 x10E6/uL (ref 3.77–5.28)
RDW: 12.6 % (ref 11.7–15.4)
WBC: 5.2 10*3/uL (ref 3.4–10.8)

## 2021-10-13 LAB — CMP14+EGFR
ALT: 10 IU/L (ref 0–32)
AST: 18 IU/L (ref 0–40)
Albumin/Globulin Ratio: 1.9 (ref 1.2–2.2)
Albumin: 4.4 g/dL (ref 3.8–4.9)
Alkaline Phosphatase: 46 IU/L (ref 44–121)
BUN/Creatinine Ratio: 27 (ref 12–28)
BUN: 16 mg/dL (ref 8–27)
Bilirubin Total: <0.2 mg/dL (ref 0.0–1.2)
CO2: 24 mmol/L (ref 20–29)
Calcium: 9.1 mg/dL (ref 8.7–10.3)
Chloride: 100 mmol/L (ref 96–106)
Creatinine, Ser: 0.6 mg/dL (ref 0.57–1.00)
Globulin, Total: 2.3 g/dL (ref 1.5–4.5)
Glucose: 96 mg/dL (ref 70–99)
Potassium: 4.7 mmol/L (ref 3.5–5.2)
Sodium: 136 mmol/L (ref 134–144)
Total Protein: 6.7 g/dL (ref 6.0–8.5)
eGFR: 103 mL/min/{1.73_m2} (ref >59)

## 2021-10-13 LAB — LIPID PANEL
Chol/HDL Ratio: 5 ratio — ABNORMAL HIGH (ref 0.0–4.4)
Cholesterol, Total: 258 mg/dL — ABNORMAL HIGH (ref 100–199)
HDL: 52 mg/dL (ref >39)
LDL Chol Calc (NIH): 160 mg/dL — ABNORMAL HIGH (ref 0–99)
Triglycerides: 251 mg/dL — ABNORMAL HIGH (ref 0–149)
VLDL Cholesterol Cal: 46 mg/dL — ABNORMAL HIGH (ref 5–40)

## 2021-10-14 ENCOUNTER — Other Ambulatory Visit: Payer: Self-pay | Admitting: Nurse Practitioner

## 2021-10-14 MED ORDER — ROSUVASTATIN CALCIUM 20 MG PO TABS: 20.0000 mg | ORAL_TABLET | Freq: Every day | ORAL | 11 refills | Status: DC

## 2021-10-14 NOTE — Telephone Encounter (Signed)
Yes we can try crestor- I will send in prescription

## 2021-10-14 NOTE — Progress Notes (Signed)
Prescription for crestor sent to pharmacy

## 2022-01-28 ENCOUNTER — Telehealth: Payer: Self-pay | Admitting: Nurse Practitioner

## 2022-01-28 NOTE — Telephone Encounter (Signed)
Patient is calling about setting up a Prolia shot.  ?

## 2022-01-28 NOTE — Telephone Encounter (Signed)
Patient scheduled she has already picked up her meds from the pharmacy ?

## 2022-01-31 ENCOUNTER — Ambulatory Visit (INDEPENDENT_AMBULATORY_CARE_PROVIDER_SITE_OTHER): Payer: Medicaid Other | Admitting: Family Medicine

## 2022-01-31 DIAGNOSIS — M810 Age-related osteoporosis without current pathological fracture: Secondary | ICD-10-CM | POA: Diagnosis not present

## 2022-01-31 MED ORDER — DENOSUMAB 60 MG/ML ~~LOC~~ SOSY
60.0000 mg | PREFILLED_SYRINGE | Freq: Once | SUBCUTANEOUS | Status: AC
  Administered 2022-01-31: 60 mg via SUBCUTANEOUS

## 2022-01-31 NOTE — Progress Notes (Signed)
Patient given prolia today in right arm and tolerated well. ?

## 2022-03-31 ENCOUNTER — Encounter: Payer: Self-pay | Admitting: Nurse Practitioner

## 2022-03-31 ENCOUNTER — Ambulatory Visit: Payer: Medicaid Other | Admitting: Nurse Practitioner

## 2022-03-31 VITALS — BP 140/83 | HR 75 | Temp 97.6°F | Resp 20 | Ht 65.0 in | Wt 159.0 lb

## 2022-03-31 DIAGNOSIS — E663 Overweight: Secondary | ICD-10-CM | POA: Diagnosis not present

## 2022-03-31 DIAGNOSIS — K580 Irritable bowel syndrome with diarrhea: Secondary | ICD-10-CM

## 2022-03-31 DIAGNOSIS — R4589 Other symptoms and signs involving emotional state: Secondary | ICD-10-CM | POA: Diagnosis not present

## 2022-03-31 DIAGNOSIS — D529 Folate deficiency anemia, unspecified: Secondary | ICD-10-CM | POA: Diagnosis not present

## 2022-03-31 DIAGNOSIS — E782 Mixed hyperlipidemia: Secondary | ICD-10-CM | POA: Diagnosis not present

## 2022-03-31 DIAGNOSIS — K219 Gastro-esophageal reflux disease without esophagitis: Secondary | ICD-10-CM | POA: Diagnosis not present

## 2022-03-31 DIAGNOSIS — G40909 Epilepsy, unspecified, not intractable, without status epilepticus: Secondary | ICD-10-CM

## 2022-03-31 DIAGNOSIS — M810 Age-related osteoporosis without current pathological fracture: Secondary | ICD-10-CM | POA: Diagnosis not present

## 2022-03-31 MED ORDER — ROSUVASTATIN CALCIUM 20 MG PO TABS: 20.0000 mg | ORAL_TABLET | Freq: Every day | ORAL | 11 refills | Status: DC

## 2022-03-31 MED ORDER — VIBERZI 100 MG PO TABS: 1.0000 | ORAL_TABLET | Freq: Two times a day (BID) | ORAL | 5 refills | Status: DC

## 2022-03-31 MED ORDER — PANTOPRAZOLE SODIUM 40 MG PO TBEC: 40.0000 mg | DELAYED_RELEASE_TABLET | Freq: Every day | ORAL | 1 refills | Status: DC

## 2022-03-31 NOTE — Progress Notes (Signed)
Subjective:    Patient ID: Savannah Shaw, female    DOB: November 21, 1960, 61 y.o.   MRN: [id number]   Chief Complaint: Medical Management of Chronic Issues    HPI:  Savannah Shaw is a 61 y.o. who identifies as a female who was assigned female at birth.   Social history: Lives with: mom and nephew Work history: retired   Scientist, forensic in today for follow up of the following chronic medical issues:  1. Mixed hyperlipidemia Does try to watch diet. Does no dedicated exercise. Lab Results  Component Value Date   CHOL 258 (H) 10/12/2021   HDL 52 10/12/2021   LDLCALC 160 (H) 10/12/2021   LDLDIRECT 135 (H) 05/04/2021   TRIG 251 (H) 10/12/2021   CHOLHDL 5.0 (H) 10/12/2021   The 10-year ASCVD risk score (Arnett DK, et al., 2019) is: 5.4%   2. Gastroesophageal reflux disease, unspecified whether esophagitis present Is on protonix daily . Works well to keep symptoms under control  3. Seizure disorder ([place]) No recent seizure activity is on zonegran, depakote and tegretol. She follow up with urology every 6 months  4. Depressed mood Is on no antidepressants.    03/31/2022    2:01 PM 10/12/2021    8:56 AM 07/08/2021    8:32 AM  Depression screen PHQ 2/9  Decreased Interest 0 1 0  Down, Depressed, Hopeless 0 0 1  PHQ - 2 Score 0 1 1  Altered sleeping 0 0   Tired, decreased energy 2 1   Change in appetite 1 0   Feeling bad or failure about yourself  0 0   Trouble concentrating 0 0   Moving slowly or fidgety/restless 0 0   Suicidal thoughts 0 0   PHQ-9 Score 3 2   Difficult doing work/chores Not difficult at all Not difficult at all      5. Anemia due to folic acid deficiency, unspecified deficiency type No c/o fatigue Lab Results  Component Value Date   HGB 13.5 10/12/2021     6. Age-related osteoporosis without current pathological fracture Last dexascan was done on 08/18/20. T score was -2.9. She does no weight bearing exercises  7. Overweight (BMI 25.0-29.9) No recent  weight changes Wt Readings from Last 3 Encounters:  03/31/22 159 lb (72.1 kg)  10/12/21 157 lb (71.2 kg)  07/08/21 154 lb (69.9 kg)   BMI Readings from Last 3 Encounters:  03/31/22 26.46 kg/m  10/12/21 26.13 kg/m  07/08/21 25.63 kg/m      New complaints: None today  Allergies  Allergen Reactions   Terbinafine Hcl Rash    Lamasil   Topamax [Topiramate] Rash   Outpatient Encounter Medications as of 03/31/2022  Medication Sig   cetirizine (ZYRTEC) 10 MG tablet Take 1 tablet (10 mg total) by mouth at bedtime.   divalproex (DEPAKOTE) 500 MG DR tablet Take 2 tablets (1,000 mg total) by mouth 2 (two) times daily.   Eluxadoline (VIBERZI) 100 MG TABS Take 1 tablet (100 mg total) by mouth 2 (two) times daily.   fluticasone (FLONASE) 50 MCG/ACT nasal spray Place 2 sprays into both nostrils daily.   pantoprazole (PROTONIX) 40 MG tablet Take 1 tablet (40 mg total) by mouth daily.   rosuvastatin (CRESTOR) 20 MG tablet Take 1 tablet (20 mg total) by mouth daily.   TEGRETOL 200 MG tablet TAKE 1 & 1/2 (ONE & ONE-HALF) TABLETS BY MOUTH IN THE MORNING AND 1 WITH LUNCH AND 1 & 1/2 IN THE EVENING  zonisamide (ZONEGRAN) 100 MG capsule Take 1 capsule (100 mg total) by mouth daily.   [DISCONTINUED] diclofenac (VOLTAREN) 75 MG EC tablet Take 1 tablet (75 mg total) by mouth 2 (two) times daily.   No facility-administered encounter medications on file as of 03/31/2022.    Past Surgical History:  Procedure Laterality Date   APPENDECTOMY     CARDIOVASCULAR STRESS TEST  07-20-2011   normal nuclear study/  no ischemia, normal LV function and wall motion, ef 76%   CHOLECYSTECTOMY OPEN  1970's   w/  Appendectomy   CYSTOSCOPY W/ URETERAL STENT PLACEMENT Left 12/31/2015   Procedure: CYSTOSCOPY WITH RETROGRADE PYELOGRAM/URETERAL STENT PLACEMENT;  Surgeon: Cleon Gustin, MD;  Location: [facility];  Service: Urology;  Laterality: Left;   CYSTOSCOPY W/ URETERAL STENT PLACEMENT Left 01/14/2016   Procedure:  CYSTOSCOPY WITH STENT REPLACEMENT;  Surgeon: Cleon Gustin, MD;  Location: [facility];  Service: Urology;  Laterality: Left;   CYSTOSCOPY/RETROGRADE/URETEROSCOPY/STONE EXTRACTION WITH BASKET Left 01/14/2016   Procedure: CYSTOSCOPY/RETROGRADE/URETEROSCOPY/STONE EXTRACTION WITH BASKET;  Surgeon: Cleon Gustin, MD;  Location: [facility];  Service: Urology;  Laterality: Left;   HOLMIUM LASER APPLICATION Left 0/10/7492   Procedure: HOLMIUM LASER APPLICATION;  Surgeon: Cleon Gustin, MD;  Location: [facility];  Service: Urology;  Laterality: Left;   OOPHORECTOMY  1980's   Unilateral oophorectomy and  Bilateral Salpingectomy (tubal)   TONSILLECTOMY AND ADENOIDECTOMY  as child   TRANSTHORACIC ECHOCARDIOGRAM  07-20-2011   ef 55-65%/  trivial MR/  mild TR    Family History  Problem Relation Age of Onset   Diabetes Mother    Heart disease Mother 65       No details about the type of heart disease.     Prostate cancer Other    Colon cancer Neg Hx       Controlled substance contract: n/a     Review of Systems  Constitutional:  Negative for diaphoresis.  Eyes:  Negative for pain.  Respiratory:  Negative for shortness of breath.   Cardiovascular:  Negative for chest pain, palpitations and leg swelling.  Gastrointestinal:  Negative for abdominal pain.  Endocrine: Negative for polydipsia.  Skin:  Negative for rash.  Neurological:  Negative for dizziness, weakness and headaches.  Hematological:  Does not bruise/bleed easily.  All other systems reviewed and are negative.      Objective:   Physical Exam Vitals and nursing note reviewed.  Constitutional:      General: She is not in acute distress.    Appearance: Normal appearance. She is well-developed.  HENT:     Head: Normocephalic.     Right Ear: Tympanic membrane normal.     Left Ear: Tympanic membrane normal.     Nose: Nose normal.     Mouth/Throat:     Mouth: Mucous  membranes are moist.  Eyes:     Pupils: Pupils are equal, round, and reactive to light.  Neck:     Vascular: No carotid bruit or JVD.  Cardiovascular:     Rate and Rhythm: Normal rate and regular rhythm.     Heart sounds: Normal heart sounds.  Pulmonary:     Effort: Pulmonary effort is normal. No respiratory distress.     Breath sounds: Normal breath sounds. No wheezing or rales.  Chest:     Chest wall: No tenderness.  Abdominal:     General: Bowel sounds are normal. There is no distension or abdominal bruit.  Palpations: Abdomen is soft. There is no hepatomegaly, splenomegaly, mass or pulsatile mass.     Tenderness: There is no abdominal tenderness.  Musculoskeletal:        General: Normal range of motion.     Cervical back: Normal range of motion and neck supple.  Lymphadenopathy:     Cervical: No cervical adenopathy.  Skin:    General: Skin is warm and dry.  Neurological:     Mental Status: She is alert and oriented to person, place, and time.     Deep Tendon Reflexes: Reflexes are normal and symmetric.  Psychiatric:        Behavior: Behavior normal.        Thought Content: Thought content normal.        Judgment: Judgment normal.    BP 140/83   Pulse 75   Temp 97.6 F (36.4 C) (Temporal)   Resp 20   Ht '5\' 5"'  (1.651 m)   Wt 159 lb (72.1 kg)   SpO2 98%   BMI 26.46 kg/m         Assessment & Plan:   JANISHA BUESO comes in today with chief complaint of Medical Management of Chronic Issues   Diagnosis and orders addressed:  1. Mixed hyperlipidemia Low fat diet - rosuvastatin (CRESTOR) 20 MG tablet; Take 1 tablet (20 mg total) by mouth daily.  Dispense: 30 tablet; Refill: 11 - CBC with Differential/Platelet - CMP14+EGFR - Lipid panel  2. Gastroesophageal reflux disease, unspecified whether esophagitis present Avoid spicy foods Do not eat 2 hours prior to bedtime - pantoprazole (PROTONIX) 40 MG tablet; Take 1 tablet (40 mg total) by mouth daily.   Dispense: 90 tablet; Refill: 1  3. Seizure disorder [facility]) Keep follow up with neurology  4. Depressed mood Stress management  5. Anemia due to folic acid deficiency, unspecified deficiency type Labs pending  6. Age-related osteoporosis without current pathological fracture Weight bearing exercises  7. Overweight (BMI 25.0-29.9) Discussed diet and exercise for person with BMI >25 Will recheck weight in 3-6 months  8. Irritable bowel syndrome with diarrhea - Eluxadoline (VIBERZI) 100 MG TABS; Take 1 tablet (100 mg total) by mouth 2 (two) times daily.  Dispense: 60 tablet; Refill: 5   Labs pending Health Maintenance reviewed Diet and exercise encouraged  Follow up plan: 6 months   Mary-Margaret Hassell Done, FNP

## 2022-03-31 NOTE — Patient Instructions (Signed)
Exercising to Stay Healthy To become healthy and stay healthy, it is recommended that you do moderate-intensity and vigorous-intensity exercise. You can tell that you are exercising at a moderate intensity if your heart starts beating faster and you start breathing faster but can still hold a conversation. You can tell that you are exercising at a vigorous intensity if you are breathing much harder and faster and cannot hold a conversation while exercising. How can exercise benefit me? Exercising regularly is important. It has many health benefits, such as: Improving overall fitness, flexibility, and endurance. Increasing bone density. Helping with weight control. Decreasing body fat. Increasing muscle strength and endurance. Reducing stress and tension, anxiety, depression, or anger. Improving overall health. What guidelines should I follow while exercising? Before you start a new exercise program, talk with your health care provider. Do not exercise so much that you hurt yourself, feel dizzy, or get very short of breath. Wear comfortable clothes and wear shoes with good support. Drink plenty of water while you exercise to prevent dehydration or heat stroke. Work out until your breathing and your heartbeat get faster (moderate intensity). How often should I exercise? Choose an activity that you enjoy, and set realistic goals. Your health care provider can help you make an activity plan that is individually designed and works best for you. Exercise regularly as told by your health care provider. This may include: Doing strength training two times a week, such as: Lifting weights. Using resistance bands. Push-ups. Sit-ups. Yoga. Doing a certain intensity of exercise for a given amount of time. Choose from these options: A total of 150 minutes of moderate-intensity exercise every week. A total of 75 minutes of vigorous-intensity exercise every week. A mix of moderate-intensity and  vigorous-intensity exercise every week. Children, pregnant women, people who have not exercised regularly, people who are overweight, and older adults may need to talk with a health care provider about what activities are safe to perform. If you have a medical condition, be sure to talk with your health care provider before you start a new exercise program. What are some exercise ideas? Moderate-intensity exercise ideas include: Walking 1 mile (1.6 km) in about 15 minutes. Biking. Hiking. Golfing. Dancing. Water aerobics. Vigorous-intensity exercise ideas include: Walking 4.5 miles (7.2 km) or more in about 1 hour. Jogging or running 5 miles (8 km) in about 1 hour. Biking 10 miles (16.1 km) or more in about 1 hour. Lap swimming. Roller-skating or in-line skating. Cross-country skiing. Vigorous competitive sports, such as football, basketball, and soccer. Jumping rope. Aerobic dancing. What are some everyday activities that can help me get exercise? Yard work, such as: Pushing a lawn mower. Raking and bagging leaves. Washing your car. Pushing a stroller. Shoveling snow. Gardening. Washing windows or floors. How can I be more active in my day-to-day activities? Use stairs instead of an elevator. Take a walk during your lunch break. If you drive, park your car farther away from your work or school. If you take public transportation, get off one stop early and walk the rest of the way. Stand up or walk around during all of your indoor phone calls. Get up, stretch, and walk around every 30 minutes throughout the day. Enjoy exercise with a friend. Support to continue exercising will help you keep a regular routine of activity. Where to find more information You can find more information about exercising to stay healthy from: U.[place]. Department of Health and Human Services: www.hhs.gov Centers for Disease Control and Prevention (  CDC): www.cdc.gov Summary Exercising regularly is  important. It will improve your overall fitness, flexibility, and endurance. Regular exercise will also improve your overall health. It can help you control your weight, reduce stress, and improve your bone density. Do not exercise so much that you hurt yourself, feel dizzy, or get very short of breath. Before you start a new exercise program, talk with your health care provider. This information is not intended to replace advice given to you by your health care provider. Make sure you discuss any questions you have with your health care provider. Document Revised: 01/29/2021 Document Reviewed: 01/29/2021 Elsevier Patient Education  2023 Elsevier Inc.  

## 2022-04-01 LAB — CBC WITH DIFFERENTIAL/PLATELET
Basophils Absolute: 0 10*3/uL (ref 0.0–0.2)
Basos: 1 %
EOS (ABSOLUTE): 0.1 10*3/uL (ref 0.0–0.4)
Eos: 1 %
Hematocrit: 39.5 % (ref 34.0–46.6)
Hemoglobin: 13.4 g/dL (ref 11.1–15.9)
Immature Grans (Abs): 0 10*3/uL (ref 0.0–0.1)
Immature Granulocytes: 0 %
Lymphocytes Absolute: 2.6 10*3/uL (ref 0.7–3.1)
Lymphs: 54 %
MCH: 33.3 pg — ABNORMAL HIGH (ref 26.6–33.0)
MCHC: 33.9 g/dL (ref 31.5–35.7)
MCV: 98 fL — ABNORMAL HIGH (ref 79–97)
Monocytes Absolute: 0.4 10*3/uL (ref 0.1–0.9)
Monocytes: 9 %
Neutrophils Absolute: 1.7 10*3/uL (ref 1.4–7.0)
Neutrophils: 35 %
Platelets: 221 10*3/uL (ref 150–450)
RBC: 4.02 x10E6/uL (ref 3.77–5.28)
RDW: 13.1 % (ref 11.7–15.4)
WBC: 4.9 10*3/uL (ref 3.4–10.8)

## 2022-04-01 LAB — LIPID PANEL
Chol/HDL Ratio: 2.2 ratio (ref 0.0–4.4)
Cholesterol, Total: 157 mg/dL (ref 100–199)
HDL: 72 mg/dL (ref >39)
LDL Chol Calc (NIH): 67 mg/dL (ref 0–99)
Triglycerides: 98 mg/dL (ref 0–149)
VLDL Cholesterol Cal: 18 mg/dL (ref 5–40)

## 2022-04-01 LAB — CMP14+EGFR
ALT: 11 IU/L (ref 0–32)
AST: 22 IU/L (ref 0–40)
Albumin/Globulin Ratio: 2 (ref 1.2–2.2)
Albumin: 4.7 g/dL (ref 3.8–4.8)
Alkaline Phosphatase: 40 IU/L — ABNORMAL LOW (ref 44–121)
BUN/Creatinine Ratio: 20 (ref 12–28)
BUN: 11 mg/dL (ref 8–27)
Bilirubin Total: <0.2 mg/dL (ref 0.0–1.2)
CO2: 21 mmol/L (ref 20–29)
Calcium: 8.9 mg/dL (ref 8.7–10.3)
Chloride: 102 mmol/L (ref 96–106)
Creatinine, Ser: 0.55 mg/dL — ABNORMAL LOW (ref 0.57–1.00)
Globulin, Total: 2.4 g/dL (ref 1.5–4.5)
Glucose: 91 mg/dL (ref 70–99)
Potassium: 5.2 mmol/L (ref 3.5–5.2)
Sodium: 139 mmol/L (ref 134–144)
Total Protein: 7.1 g/dL (ref 6.0–8.5)
eGFR: 104 mL/min/{1.73_m2} (ref >59)

## 2022-04-12 ENCOUNTER — Ambulatory Visit: Payer: Medicaid Other | Admitting: Nurse Practitioner

## 2022-05-27 ENCOUNTER — Other Ambulatory Visit: Payer: Self-pay | Admitting: Family Medicine

## 2022-05-27 DIAGNOSIS — G40909 Epilepsy, unspecified, not intractable, without status epilepticus: Secondary | ICD-10-CM

## 2022-05-30 ENCOUNTER — Other Ambulatory Visit: Payer: Self-pay | Admitting: Family Medicine

## 2022-06-30 ENCOUNTER — Telehealth: Payer: Self-pay | Admitting: Nurse Practitioner

## 2022-06-30 MED ORDER — PROLIA 60 MG/ML ~~LOC~~ SOSY: 60.0000 mg | PREFILLED_SYRINGE | SUBCUTANEOUS | 0 refills | Status: DC

## 2022-06-30 NOTE — Telephone Encounter (Signed)
Message left for patient that Prolia sent to pharmacy. Patient advised to call office when she receives to schedule appointment.

## 2022-07-05 NOTE — Telephone Encounter (Signed)
Patient is due for Prolia on 08/02/22. She will pick prolia up from the pharmacy and then call us to schedule.

## 2022-07-26 DIAGNOSIS — Z23 Encounter for immunization: Secondary | ICD-10-CM | POA: Diagnosis not present

## 2022-08-02 ENCOUNTER — Ambulatory Visit (INDEPENDENT_AMBULATORY_CARE_PROVIDER_SITE_OTHER): Payer: Medicaid Other | Admitting: *Deleted

## 2022-08-02 DIAGNOSIS — M810 Age-related osteoporosis without current pathological fracture: Secondary | ICD-10-CM

## 2022-08-02 MED ORDER — DENOSUMAB 60 MG/ML ~~LOC~~ SOSY
60.0000 mg | PREFILLED_SYRINGE | Freq: Once | SUBCUTANEOUS | Status: AC
  Administered 2022-08-02: 60 mg via SUBCUTANEOUS

## 2022-08-02 NOTE — Telephone Encounter (Signed)
Patient has appointment 10/17

## 2022-08-14 ENCOUNTER — Telehealth: Payer: Self-pay | Admitting: Physician Assistant

## 2022-08-14 DIAGNOSIS — K047 Periapical abscess without sinus: Secondary | ICD-10-CM

## 2022-08-15 MED ORDER — AMOXICILLIN 500 MG PO CAPS: 500.0000 mg | ORAL_CAPSULE | Freq: Three times a day (TID) | ORAL | 0 refills | Status: AC

## 2022-08-15 MED ORDER — IBUPROFEN 600 MG PO TABS: 600.0000 mg | ORAL_TABLET | Freq: Three times a day (TID) | ORAL | 0 refills | Status: DC | PRN

## 2022-08-15 NOTE — Progress Notes (Signed)

## 2022-08-26 ENCOUNTER — Other Ambulatory Visit: Payer: Self-pay | Admitting: Nurse Practitioner

## 2022-08-26 DIAGNOSIS — G40909 Epilepsy, unspecified, not intractable, without status epilepticus: Secondary | ICD-10-CM

## 2022-08-27 ENCOUNTER — Other Ambulatory Visit: Payer: Self-pay | Admitting: Nurse Practitioner

## 2022-08-27 DIAGNOSIS — G40909 Epilepsy, unspecified, not intractable, without status epilepticus: Secondary | ICD-10-CM

## 2022-08-29 ENCOUNTER — Other Ambulatory Visit: Payer: Self-pay | Admitting: *Deleted

## 2022-08-29 DIAGNOSIS — G40909 Epilepsy, unspecified, not intractable, without status epilepticus: Secondary | ICD-10-CM

## 2022-08-29 MED ORDER — TEGRETOL 200 MG PO TABS: ORAL_TABLET | ORAL | 0 refills | Status: DC

## 2022-09-15 ENCOUNTER — Other Ambulatory Visit (HOSPITAL_COMMUNITY): Payer: Self-pay

## 2022-10-03 ENCOUNTER — Ambulatory Visit (INDEPENDENT_AMBULATORY_CARE_PROVIDER_SITE_OTHER): Payer: Medicaid Other | Admitting: Nurse Practitioner

## 2022-10-03 ENCOUNTER — Encounter: Payer: Self-pay | Admitting: Nurse Practitioner

## 2022-10-03 VITALS — BP 141/78 | HR 70 | Temp 97.5°F | Resp 20 | Ht 65.0 in | Wt 153.0 lb

## 2022-10-03 DIAGNOSIS — G40909 Epilepsy, unspecified, not intractable, without status epilepticus: Secondary | ICD-10-CM | POA: Diagnosis not present

## 2022-10-03 DIAGNOSIS — K219 Gastro-esophageal reflux disease without esophagitis: Secondary | ICD-10-CM

## 2022-10-03 DIAGNOSIS — E782 Mixed hyperlipidemia: Secondary | ICD-10-CM | POA: Diagnosis not present

## 2022-10-03 DIAGNOSIS — M810 Age-related osteoporosis without current pathological fracture: Secondary | ICD-10-CM | POA: Diagnosis not present

## 2022-10-03 DIAGNOSIS — R4589 Other symptoms and signs involving emotional state: Secondary | ICD-10-CM | POA: Diagnosis not present

## 2022-10-03 DIAGNOSIS — K580 Irritable bowel syndrome with diarrhea: Secondary | ICD-10-CM

## 2022-10-03 DIAGNOSIS — E663 Overweight: Secondary | ICD-10-CM

## 2022-10-03 DIAGNOSIS — D529 Folate deficiency anemia, unspecified: Secondary | ICD-10-CM

## 2022-10-03 MED ORDER — ROSUVASTATIN CALCIUM 20 MG PO TABS: 20.0000 mg | ORAL_TABLET | Freq: Every day | ORAL | 1 refills | Status: DC

## 2022-10-03 MED ORDER — PANTOPRAZOLE SODIUM 40 MG PO TBEC: 40.0000 mg | DELAYED_RELEASE_TABLET | Freq: Every day | ORAL | 1 refills | Status: DC

## 2022-10-03 MED ORDER — VIBERZI 100 MG PO TABS: 1.0000 | ORAL_TABLET | Freq: Two times a day (BID) | ORAL | 5 refills | Status: DC

## 2022-10-03 NOTE — Progress Notes (Signed)
Subjective:    Patient ID: Savannah Shaw, female    DOB: 02-17-1961, 61 y.o.   MRN: [id number]   Chief Complaint: medical management of chronic issues     HPI:  Savannah Shaw is a 61 y.o. who identifies as a female who was assigned female at birth.   Social history: Lives with: mom Work history: retired   Scientist, forensic in today for follow up of the following chronic medical issues:  1. Mixed hyperlipidemia Doe snot watch diet and does no dedicated exercise. Lab Results  Component Value Date   CHOL 157 03/31/2022   HDL 72 03/31/2022   LDLCALC 67 03/31/2022   LDLDIRECT 135 (H) 05/04/2021   TRIG 98 03/31/2022   CHOLHDL 2.2 03/31/2022   The 10-year ASCVD risk score (Arnett DK, et al., 2019) is: 3.1%   2. Gastroesophageal reflux disease, unspecified whether esophagitis present Is on protonix daily and is doing well.  3. Seizure disorder ([place]) Has been years since she had a seizure.  4. Anemia due to folic acid deficiency, unspecified deficiency type Lab Results  Component Value Date   HGB 13.4 03/31/2022     5. Depressed mood Is currently not on antidepressant    10/03/2022    2:04 PM 03/31/2022    2:01 PM 10/12/2021    8:56 AM  Depression screen PHQ 2/9  Decreased Interest 0 0 1  Down, Depressed, Hopeless 1 0 0  PHQ - 2 Score 1 0 1  Altered sleeping 0 0 0  Tired, decreased energy _0 Change in appetite 0 1 0  Feeling bad or failure about yourself  0 0 0  Trouble concentrating 1 0 0  Moving slowly or fidgety/restless 0 0 0  Suicidal thoughts 0 0 0  PHQ-9 Score _1 Difficult doing work/chores Somewhat difficult Not difficult at all Not difficult at all     6. Age-related osteoporosis without current pathological fracture Last dexascan was done on 08/18/20. Will schedule for repeat test.  7. Overweight (BMI 25.0-29.9) Weight is down 5lbs Wt Readings from Last 3 Encounters:  10/03/22 153 lb (69.4 kg)  03/31/22 159 lb (72.1 kg)  10/12/21 157 lb (71.2  kg)   BMI Readings from Last 3 Encounters:  10/03/22 25.46 kg/m  03/31/22 26.46 kg/m  10/12/21 26.13 kg/m     New complaints: None   Allergies  Allergen Reactions   Terbinafine Hcl Rash    Lamasil   Topamax [Topiramate] Rash   Outpatient Encounter Medications as of 10/03/2022  Medication Sig   cetirizine (ZYRTEC) 10 MG tablet Take 1 tablet (10 mg total) by mouth at bedtime.   divalproex (DEPAKOTE) 500 MG DR tablet Take 2 tablets by mouth twice daily   Eluxadoline (VIBERZI) 100 MG TABS Take 1 tablet (100 mg total) by mouth 2 (two) times daily.   fluticasone (FLONASE) 50 MCG/ACT nasal spray Use 2 spray(s) in each nostril once daily   ibuprofen (ADVIL) 600 MG tablet Take 1 tablet (600 mg total) by mouth every 8 (eight) hours as needed.   pantoprazole (PROTONIX) 40 MG tablet Take 1 tablet (40 mg total) by mouth daily.   PROLIA 60 MG/ML SOSY injection Inject 60 mg into the skin every 6 (six) months.   rosuvastatin (CRESTOR) 20 MG tablet Take 1 tablet (20 mg total) by mouth daily.   TEGRETOL 200 MG tablet TAKE 1 & 1/2 (ONE & ONE-HALF) TABLETS BY MOUTH IN THE MORNING THEN 1 TAB WITH  LUNCH THEN 1 & 1/2 (ONE & ONE-HALF) TAB IN THE EVENING   zonisamide (ZONEGRAN) 100 MG capsule Take 1 capsule by mouth once daily   No facility-administered encounter medications on file as of 10/03/2022.    Past Surgical History:  Procedure Laterality Date   APPENDECTOMY     CARDIOVASCULAR STRESS TEST  07-20-2011   normal nuclear study/  no ischemia, normal LV function and wall motion, ef 76%   CHOLECYSTECTOMY OPEN  1970's   w/  Appendectomy   CYSTOSCOPY W/ URETERAL STENT PLACEMENT Left 12/31/2015   Procedure: CYSTOSCOPY WITH RETROGRADE PYELOGRAM/URETERAL STENT PLACEMENT;  Surgeon: Cleon Gustin, MD;  Location: [facility];  Service: Urology;  Laterality: Left;   CYSTOSCOPY W/ URETERAL STENT PLACEMENT Left 01/14/2016   Procedure: CYSTOSCOPY WITH STENT REPLACEMENT;  Surgeon: Cleon Gustin, MD;   Location[facility];  Service: Urology;  Laterality: Left;   CYSTOSCOPY/RETROGRADE/URETEROSCOPY/STONE EXTRACTION WITH BASKET Left 01/14/2016   Procedure: CYSTOSCOPY/RETROGRADE/URETEROSCOPY/STONE EXTRACTION WITH BASKET;  Surgeon: Cleon Gustin, MD;  Location: [facility];  Service: Urology;  Laterality: Left;   HOLMIUM LASER APPLICATION Left 2/42/6834   Procedure: HOLMIUM LASER APPLICATION;  Surgeon: Cleon Gustin, MD;  Location: [facility];  Service: Urology;  Laterality: Left;   OOPHORECTOMY  1980's   Unilateral oophorectomy and  Bilateral Salpingectomy (tubal)   TONSILLECTOMY AND ADENOIDECTOMY  as child   TRANSTHORACIC ECHOCARDIOGRAM  07-20-2011   ef 55-65%/  trivial MR/  mild TR    Family History  Problem Relation Age of Onset   Diabetes Mother    Heart disease Mother 85       No details about the type of heart disease.     Prostate cancer Other    Colon cancer Neg Hx       Controlled substance contract: n/a     Review of Systems  Constitutional:  Negative for diaphoresis.  Eyes:  Negative for pain.  Respiratory:  Negative for shortness of breath.   Cardiovascular:  Negative for chest pain, palpitations and leg swelling.  Gastrointestinal:  Negative for abdominal pain.  Endocrine: Negative for polydipsia.  Skin:  Negative for rash.  Neurological:  Negative for dizziness, weakness and headaches.  Hematological:  Does not bruise/bleed easily.  All other systems reviewed and are negative.      Objective:   Physical Exam Vitals and nursing note reviewed.  Constitutional:      General: She is not in acute distress.    Appearance: Normal appearance. She is well-developed.  HENT:     Head: Normocephalic.     Right Ear: Tympanic membrane normal.     Left Ear: Tympanic membrane normal.     Nose: Nose normal.     Mouth/Throat:     Mouth: Mucous membranes are moist.  Eyes:     Pupils: Pupils are equal, round, and  reactive to light.  Neck:     Vascular: No carotid bruit or JVD.  Cardiovascular:     Rate and Rhythm: Normal rate and regular rhythm.     Heart sounds: Normal heart sounds.  Pulmonary:     Effort: Pulmonary effort is normal. No respiratory distress.     Breath sounds: Normal breath sounds. No wheezing or rales.  Chest:     Chest wall: No tenderness.  Abdominal:     General: Bowel sounds are normal. There is no distension or abdominal bruit.     Palpations: Abdomen is soft. There is no  hepatomegaly, splenomegaly, mass or pulsatile mass.     Tenderness: There is no abdominal tenderness.  Musculoskeletal:        General: Normal range of motion.     Cervical back: Normal range of motion and neck supple.  Lymphadenopathy:     Cervical: No cervical adenopathy.  Skin:    General: Skin is warm and dry.  Neurological:     Mental Status: She is alert and oriented to person, place, and time.     Deep Tendon Reflexes: Reflexes are normal and symmetric.  Psychiatric:        Behavior: Behavior normal.        Thought Content: Thought content normal.        Judgment: Judgment normal.   BP (!) 141/78   Pulse 70   Temp (!) 97.5 F (36.4 C) (Temporal)   Resp 20   Ht _0  (1.651 m)   Wt 153 lb (69.4 kg)   SpO2 100%   BMI 25.46 kg/m         Assessment & Plan:   JANESE RADABAUGH comes in today with chief complaint of Medical Management of Chronic Issues   Diagnosis and orders addressed:  1. Mixed hyperlipidemia Low fat diet - rosuvastatin (CRESTOR) 20 MG tablet; Take 1 tablet (20 mg total) by mouth daily.  Dispense: 90 tablet; Refill: 1 - CBC with Differential/Platelet - CMP14+EGFR - Lipid panel  2. Gastroesophageal reflux disease, unspecified whether esophagitis present Avoid spicy foods Do not eat 2 hours prior to bedtime  - pantoprazole (PROTONIX) 40 MG tablet; Take 1 tablet (40 mg total) by mouth daily.  Dispense: 90 tablet; Refill: 1  3. Seizure disorder [facility]) Keep  follow up with neurology  4. Anemia due to folic acid deficiency, unspecified deficiency type Labs pending  5. Depressed mood Stress management  6. Age-related osteoporosis without current pathological fracture Weight bearing exercise Will order dexascan  7. Overweight (BMI 25.0-29.9)  8. Irritable bowel syndrome with diarrhea Watch diet - Eluxadoline (VIBERZI) 100 MG TABS; Take 1 tablet (100 mg total) by mouth 2 (two) times daily.  Dispense: 60 tablet; Refill: 5   Labs pending Health Maintenance reviewed Diet and exercise encouraged  Follow up plan: 6 months   Mary-Margaret Hassell Done, FNP Low fat diet

## 2022-10-03 NOTE — Patient Instructions (Signed)

## 2022-10-04 LAB — CMP14+EGFR
ALT: 11 IU/L (ref 0–32)
AST: 19 IU/L (ref 0–40)
Albumin/Globulin Ratio: 2.1 (ref 1.2–2.2)
Albumin: 4.5 g/dL (ref 3.9–4.9)
Alkaline Phosphatase: 34 IU/L — ABNORMAL LOW (ref 44–121)
BUN/Creatinine Ratio: 15 (ref 12–28)
BUN: 10 mg/dL (ref 8–27)
Bilirubin Total: <0.2 mg/dL (ref 0.0–1.2)
CO2: 23 mmol/L (ref 20–29)
Calcium: 9.1 mg/dL (ref 8.7–10.3)
Chloride: 105 mmol/L (ref 96–106)
Creatinine, Ser: 0.65 mg/dL (ref 0.57–1.00)
Globulin, Total: 2.1 g/dL (ref 1.5–4.5)
Glucose: 95 mg/dL (ref 70–99)
Potassium: 4.7 mmol/L (ref 3.5–5.2)
Sodium: 141 mmol/L (ref 134–144)
Total Protein: 6.6 g/dL (ref 6.0–8.5)
eGFR: 100 mL/min/{1.73_m2} (ref >59)

## 2022-10-04 LAB — CBC WITH DIFFERENTIAL/PLATELET
Basophils Absolute: 0 10*3/uL (ref 0.0–0.2)
Basos: 1 %
EOS (ABSOLUTE): 0.1 10*3/uL (ref 0.0–0.4)
Eos: 1 %
Hematocrit: 39 % (ref 34.0–46.6)
Hemoglobin: 13.2 g/dL (ref 11.1–15.9)
Immature Grans (Abs): 0 10*3/uL (ref 0.0–0.1)
Immature Granulocytes: 0 %
Lymphocytes Absolute: 2.8 10*3/uL (ref 0.7–3.1)
Lymphs: 57 %
MCH: 33.9 pg — ABNORMAL HIGH (ref 26.6–33.0)
MCHC: 33.8 g/dL (ref 31.5–35.7)
MCV: 100 fL — ABNORMAL HIGH (ref 79–97)
Monocytes Absolute: 0.4 10*3/uL (ref 0.1–0.9)
Monocytes: 9 %
Neutrophils Absolute: 1.6 10*3/uL (ref 1.4–7.0)
Neutrophils: 32 %
Platelets: 254 10*3/uL (ref 150–450)
RBC: 3.89 x10E6/uL (ref 3.77–5.28)
RDW: 12.7 % (ref 11.7–15.4)
WBC: 4.9 10*3/uL (ref 3.4–10.8)

## 2022-10-04 LAB — LIPID PANEL
Chol/HDL Ratio: 2.4 ratio (ref 0.0–4.4)
Cholesterol, Total: 165 mg/dL (ref 100–199)
HDL: 68 mg/dL (ref >39)
LDL Chol Calc (NIH): 77 mg/dL (ref 0–99)
Triglycerides: 114 mg/dL (ref 0–149)
VLDL Cholesterol Cal: 20 mg/dL (ref 5–40)

## 2022-10-18 ENCOUNTER — Other Ambulatory Visit: Payer: Self-pay | Admitting: Nurse Practitioner

## 2022-10-18 DIAGNOSIS — J301 Allergic rhinitis due to pollen: Secondary | ICD-10-CM

## 2022-11-21 ENCOUNTER — Other Ambulatory Visit: Payer: Self-pay | Admitting: Nurse Practitioner

## 2022-11-21 DIAGNOSIS — G40909 Epilepsy, unspecified, not intractable, without status epilepticus: Secondary | ICD-10-CM

## 2022-12-09 ENCOUNTER — Ambulatory Visit: Payer: Medicaid Other | Admitting: Family Medicine

## 2022-12-09 ENCOUNTER — Encounter: Payer: Self-pay | Admitting: Family Medicine

## 2022-12-09 VITALS — BP 119/74 | HR 65 | Temp 98.8°F | Ht 65.0 in | Wt 160.0 lb

## 2022-12-09 DIAGNOSIS — K219 Gastro-esophageal reflux disease without esophagitis: Secondary | ICD-10-CM

## 2022-12-09 DIAGNOSIS — F419 Anxiety disorder, unspecified: Secondary | ICD-10-CM

## 2022-12-09 DIAGNOSIS — J329 Chronic sinusitis, unspecified: Secondary | ICD-10-CM | POA: Diagnosis not present

## 2022-12-09 MED ORDER — AMOXICILLIN-POT CLAVULANATE 875-125 MG PO TABS: 1.0000 | ORAL_TABLET | Freq: Two times a day (BID) | ORAL | 0 refills | Status: DC

## 2022-12-09 MED ORDER — HYDROXYZINE HCL 10 MG PO TABS: 10.0000 mg | ORAL_TABLET | Freq: Three times a day (TID) | ORAL | 99 refills | Status: DC | PRN

## 2022-12-09 MED ORDER — ESOMEPRAZOLE MAGNESIUM 40 MG PO CPDR: 40.0000 mg | DELAYED_RELEASE_CAPSULE | Freq: Every day | ORAL | 3 refills | Status: DC

## 2022-12-09 NOTE — Progress Notes (Signed)
Subjective: CC: GERD PCP: Chevis Pretty, FNP [id number] Savannah Shaw is a 62 y.o. female presenting to clinic today for:  1.  GERD Patient reports that she has been having uncontrolled GERD for the last several weeks.  She gets some discomfort in her chest after eating and is belching more frequently.  She has been on Protonix for many years now.  Thinks that she may have been on something else before but cannot recall what it was.  Does not report any nausea, vomiting, bloody stool.  2.  Sinus drainage Patient reports she has been having copious sinus drainage.  No discoloration to the drainage but she has been coughing up yellow sputum.  Denies any fevers.  No nausea, vomiting, diarrhea reported.  She has been utilizing both her Flonase and her Zyrtec but these are not helping her symptoms.   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, controlled [facility]) neurologist-  dr Krista Blue ([place] [facility])   last seizure 2008  (dx age 66)   History of acute pyelonephritis    w/ urosepsis 12-31-2015  resolved   History of kidney stones    Hyperlipidemia    Left ureteral stone    Normal exercise tolerance test results    12-24-2015 (dr hochrein)  no chest pain, no signficant arrhythmias , 85% max heartrate achieved    Current Outpatient Medications:    cetirizine (ZYRTEC) 10 MG tablet, TAKE 1 TABLET BY MOUTH AT BEDTIME, Disp: 90 tablet, Rfl: 1   divalproex (DEPAKOTE) 500 MG DR tablet, Take 2 tablets by mouth twice daily, Disp: 360 tablet, Rfl: 0   Eluxadoline (VIBERZI) 100 MG TABS, Take 1 tablet (100 mg total) by mouth 2 (two) times daily., Disp: 60 tablet, Rfl: 5   fluticasone (FLONASE) 50 MCG/ACT nasal spray, Use 2 spray(s) in each nostril once daily, Disp: 16 g, Rfl: 5   pantoprazole (PROTONIX) 40 MG tablet, Take 1  tablet (40 mg total) by mouth daily., Disp: 90 tablet, Rfl: 1   PROLIA 60 MG/ML SOSY injection, Inject 60 mg into the skin every 6 (six) months., Disp: 180 mL, Rfl: 0   rosuvastatin (CRESTOR) 20 MG tablet, Take 1 tablet (20 mg total) by mouth daily., Disp: 90 tablet, Rfl: 1   TEGRETOL 200 MG tablet, TAKE 1 & 1/2 (ONE & ONE-HALF) TABLETS BY MOUTH IN THE MORNING THEN 1 TAB WITH LUNCH THEN 1 & 1/2 (ONE & ONE-HALF) TAB IN THE EVENING, Disp: 360 tablet, Rfl: 0   zonisamide (ZONEGRAN) 100 MG capsule, Take 1 capsule by mouth once daily, Disp: 90 capsule, Rfl: 0 Social History   Socioeconomic History   Marital status: Single    Spouse name: Not on file   Number of children: Not on file   Years of education: Not on file   Highest education level: Not on file  Occupational History   Occupation: Disabled    Comment: Seizures  Tobacco Use   Smoking status: Former    Packs/day: 1.00    Years: 5.00    Total pack years: 5.00    Types: Cigarettes    Quit date: 01/11/2011    Years since quitting: 11.9   Smokeless tobacco: Never  Vaping Use   Vaping Use: Some days  Substance and Sexual Activity   Alcohol use: No  Drug use: No   Sexual activity: Not on file  Other Topics Concern   Not on file  Social History Narrative   Daily Caffeine use - 3   Social Determinants of Health   Financial Resource Strain: Not on file  Food Insecurity: Not on file  Transportation Needs: Not on file  Physical Activity: Not on file  Stress: Not on file  Social Connections: Not on file  Intimate Partner Violence: Not on file   Family History  Problem Relation Age of Onset   Diabetes Mother    Heart disease Mother 69       No details about the type of heart disease.     Prostate cancer Other    Colon cancer Neg Hx     Objective: Office vital signs reviewed. BP 119/74   Pulse 65   Temp 98.8 F (37.1 C)   Ht '5\' 5"'$  (1.651 m)   Wt 160 lb (72.6 kg)   SpO2 98%   BMI 26.63 kg/m   Physical  Examination:  General: Awake, alert, well nourished, No acute distress HEENT: Normal    Neck: No masses palpated. No lymphadenopathy    Ears: Tympanic membranes intact, normal light reflex, no erythema, no bulging    Eyes: PERRLA, extraocular membranes intact, sclera white    Nose: nasal turbinates moist, clear nasal discharge    Throat: moist mucus membranes, no erythema, no tonsillar exudate.  Airway is patent Cardio: regular rate and rhythm, S1S2 heard, no murmurs appreciated Pulm: clear to auscultation bilaterally, no wheezes, rhonchi or rales; normal work of breathing on room air GI: soft, mild epigastric tenderness palpation present, non-distended, bowel sounds present x4, no hepatomegaly, no splenomegaly, no masses MSK: Ambulating independently with normal gait and station Psych: Mood stable, speech normal, affect appropriate     12/09/2022   11:00 AM 10/03/2022    2:04 PM 03/31/2022    2:01 PM  Depression screen PHQ 2/9  Decreased Interest 0 0 0  Down, Depressed, Hopeless 0 1 0  PHQ - 2 Score 0 1 0  Altered sleeping 0 0 0  Tired, decreased energy 0 1 2  Change in appetite 0 0 1  Feeling bad or failure about yourself  0 0 0  Trouble concentrating 0 1 0  Moving slowly or fidgety/restless 0 0 0  Suicidal thoughts 0 0 0  PHQ-9 Score 0 3 3  Difficult doing work/chores Not difficult at all Somewhat difficult Not difficult at all      12/09/2022   11:00 AM 10/03/2022    2:05 PM 03/31/2022    2:01 PM 10/12/2021    8:56 AM  GAD 7 : Generalized Anxiety Score  Nervous, Anxious, on Edge 0 1 0 0  Control/stop worrying 0 1 0 0  Worry too much - different things 0 0 0 0  Trouble relaxing 0 1 0 1  Restless 0 0 0 1  Easily annoyed or irritable 0 1 0 1  Afraid - awful might happen 0 0 0 0  Total GAD 7 Score 0 4 0 3  Anxiety Difficulty Not difficult at all Somewhat difficult Not difficult at all Not difficult at all    Assessment/ Plan: 62 y.o. female   Gastroesophageal reflux  disease without esophagitis - Plan: esomeprazole (NEXIUM) 40 MG capsule  Rhinosinusitis - Plan: amoxicillin-clavulanate (AUGMENTIN) 875-125 MG tablet  Anxiety - Plan: hydrOXYzine (ATARAX) 10 MG tablet  Trial of Nexium.  If this is not helpful we can  trial her on omeprazole instead and ultimately Dexilant again if that is refractory.  Will reassess her in a couple of months, sooner if concerns arise  Symptoms are progressive and refractory to OTC treatments.  Augmentin written as a pocket prescription.  She understands signs and symptoms that warrant initiation.  Okay to utilize Atarax.  Current supply it was running out so needing a new 1.  No orders of the defined types were placed in this encounter.  No orders of the defined types were placed in this encounter.    Janora Norlander, DO [place] [phone]  a

## 2022-12-09 NOTE — Patient Instructions (Signed)

## 2023-01-03 ENCOUNTER — Other Ambulatory Visit: Payer: Self-pay | Admitting: Nurse Practitioner

## 2023-01-03 ENCOUNTER — Other Ambulatory Visit: Payer: Self-pay | Admitting: Family Medicine

## 2023-01-03 DIAGNOSIS — M810 Age-related osteoporosis without current pathological fracture: Secondary | ICD-10-CM

## 2023-01-03 NOTE — Telephone Encounter (Signed)
She needs and updated calcium (CMP) and vitamin d to refill this.  Please place order and have pt come in to have collected

## 2023-01-03 NOTE — Telephone Encounter (Signed)
Orders placed left message for patient to call back

## 2023-01-17 ENCOUNTER — Ambulatory Visit: Payer: Medicaid Other

## 2023-01-30 ENCOUNTER — Telehealth: Payer: Self-pay

## 2023-01-30 ENCOUNTER — Other Ambulatory Visit (HOSPITAL_COMMUNITY): Payer: Self-pay

## 2023-01-30 NOTE — Telephone Encounter (Signed)
Prolia VOB initiated via [email or web address] 

## 2023-02-02 ENCOUNTER — Ambulatory Visit (INDEPENDENT_AMBULATORY_CARE_PROVIDER_SITE_OTHER): Payer: Medicaid Other

## 2023-02-02 DIAGNOSIS — M810 Age-related osteoporosis without current pathological fracture: Secondary | ICD-10-CM

## 2023-02-02 MED ORDER — DENOSUMAB 60 MG/ML ~~LOC~~ SOSY
60.0000 mg | PREFILLED_SYRINGE | Freq: Once | SUBCUTANEOUS | Status: AC
  Administered 2023-02-02: 60 mg via SUBCUTANEOUS

## 2023-02-02 NOTE — Progress Notes (Signed)
Prolia injection given to left arm.  Patient tolerated well. Patient supplied. 

## 2023-02-07 ENCOUNTER — Other Ambulatory Visit: Payer: Self-pay | Admitting: *Deleted

## 2023-02-07 DIAGNOSIS — G40909 Epilepsy, unspecified, not intractable, without status epilepticus: Secondary | ICD-10-CM

## 2023-02-07 MED ORDER — TEGRETOL 200 MG PO TABS: ORAL_TABLET | ORAL | 0 refills | Status: DC

## 2023-02-13 NOTE — Telephone Encounter (Signed)
Patient fills through pharmacy

## 2023-02-17 ENCOUNTER — Other Ambulatory Visit: Payer: Self-pay | Admitting: Nurse Practitioner

## 2023-02-17 DIAGNOSIS — G40909 Epilepsy, unspecified, not intractable, without status epilepticus: Secondary | ICD-10-CM

## 2023-02-19 ENCOUNTER — Other Ambulatory Visit: Payer: Self-pay | Admitting: Nurse Practitioner

## 2023-02-27 ENCOUNTER — Other Ambulatory Visit: Payer: Self-pay | Admitting: Nurse Practitioner

## 2023-02-27 DIAGNOSIS — G40909 Epilepsy, unspecified, not intractable, without status epilepticus: Secondary | ICD-10-CM

## 2023-03-06 NOTE — Telephone Encounter (Signed)
Erroneous encounter will close.

## 2023-03-28 ENCOUNTER — Ambulatory Visit (INDEPENDENT_AMBULATORY_CARE_PROVIDER_SITE_OTHER): Payer: Medicaid Other | Admitting: Family Medicine

## 2023-03-28 ENCOUNTER — Encounter: Payer: Self-pay | Admitting: Family Medicine

## 2023-03-28 VITALS — BP 126/74 | HR 70 | Temp 98.3°F | Ht 65.0 in | Wt 152.0 lb

## 2023-03-28 DIAGNOSIS — Z79899 Other long term (current) drug therapy: Secondary | ICD-10-CM

## 2023-03-28 DIAGNOSIS — M810 Age-related osteoporosis without current pathological fracture: Secondary | ICD-10-CM

## 2023-03-28 DIAGNOSIS — E782 Mixed hyperlipidemia: Secondary | ICD-10-CM

## 2023-03-28 DIAGNOSIS — F439 Reaction to severe stress, unspecified: Secondary | ICD-10-CM | POA: Diagnosis not present

## 2023-03-28 DIAGNOSIS — Z0001 Encounter for general adult medical examination with abnormal findings: Secondary | ICD-10-CM

## 2023-03-28 DIAGNOSIS — K580 Irritable bowel syndrome with diarrhea: Secondary | ICD-10-CM

## 2023-03-28 DIAGNOSIS — G40909 Epilepsy, unspecified, not intractable, without status epilepticus: Secondary | ICD-10-CM

## 2023-03-28 DIAGNOSIS — H9193 Unspecified hearing loss, bilateral: Secondary | ICD-10-CM

## 2023-03-28 DIAGNOSIS — Z0000 Encounter for general adult medical examination without abnormal findings: Secondary | ICD-10-CM

## 2023-03-28 MED ORDER — ZONISAMIDE 100 MG PO CAPS: 100.0000 mg | ORAL_CAPSULE | Freq: Every day | ORAL | 1 refills | Status: DC

## 2023-03-28 MED ORDER — TEGRETOL 200 MG PO TABS: ORAL_TABLET | ORAL | 1 refills | Status: DC

## 2023-03-28 MED ORDER — DIVALPROEX SODIUM 500 MG PO DR TAB
1000.0000 mg | DELAYED_RELEASE_TABLET | Freq: Two times a day (BID) | ORAL | 1 refills | Status: DC
Start: 2023-03-28 — End: 2023-10-02

## 2023-03-28 MED ORDER — VIBERZI 100 MG PO TABS
1.0000 | ORAL_TABLET | Freq: Two times a day (BID) | ORAL | 5 refills | Status: DC
Start: 2023-03-28 — End: 2023-10-19

## 2023-03-28 NOTE — Patient Instructions (Signed)
Hearing Loss Hearing loss is a partial or total loss of the ability to hear. This can be temporary or permanent, and it can happen in one or both ears. There are two types of hearing loss. You can have just one type or both types. You may have a problem with: Damage to your hearing nerves (sensorineural hearing loss). This type of hearing loss is more likely to be permanent. A hearing aid is often the best treatment. Sound getting to your inner ear (conductive hearing loss). This type of hearing loss can usually be treated medically or surgically. Medical care is necessary to treat hearing loss properly and to prevent the condition from getting worse. Your hearing may partially or completely come back, depending on what caused your hearing loss and how severe it is. In some cases, hearing loss is permanent. What are the causes? Common causes of hearing loss include: Too much wax in the ear canal. Infection of the ear canal or middle ear. Fluid in the middle ear. Injury to the ear or surrounding area. An object stuck in the ear. A history of prolonged exposure to loud sounds, such as music. Less common causes of hearing loss include: Tumors in the ear. Viral or bacterial infections, such as meningitis. A hole in the eardrum (perforated eardrum). Problems with the hearing nerve that sends signals between the brain and the ear. Certain medicines. What are the signs or symptoms? Symptoms of this condition may include: Difficulty telling the difference between sounds. Difficulty following a conversation when there is background noise. Lack of response to sounds in your environment. This may be most noticeable when you do not respond to startling sounds. Needing to turn up the volume on the television, radio, or other devices. Ringing in the ears. Dizziness. How is this diagnosed? This condition is diagnosed based on: A physical exam. A hearing test (audiometry). The test will be performed  by a hearing specialist (audiologist). Imaging tests, such as an MRI or CT scan. You may also be referred to an ear, nose, and throat (ENT) specialist (otolaryngologist). How is this treated? Treatment for hearing loss may include: Earwax removal. Medicines to treat or prevent infection (antibiotics). Medicines to reduce inflammation (corticosteroids). Hearing aids or assistive listening devices. Follow these instructions at home: If you were prescribed an antibiotic medicine, take it as told by your health care provider. Do not stop taking the antibiotic even if you start to feel better. Take over-the-counter and prescription medicines only as told by your health care provider. Avoid loud noises. Use hearing protection if you are exposed to loud noises or work in a noisy environment. Return to your normal activities as told by your health care provider. Ask your health care provider what activities are safe for you. Keep all follow-up visits. This is important. Contact a health care provider if: You feel dizzy. You develop new symptoms. You vomit or feel nauseous. You have a fever. Get help right away if: You develop sudden changes in your vision. You have severe ear pain. You have new or increased weakness. You have a severe headache. Summary Hearing loss is a decreased ability to hear sounds around you. It can be temporary or permanent. Treatment will depend on the cause of your hearing loss. It may include earwax removal, medicines, or a hearing aid. Your hearing may partially or completely come back, depending on what caused your hearing loss and how severe it is. Keep all follow-up visits. This is important. This information is  not intended to replace advice given to you by your health care provider. Make sure you discuss any questions you have with your health care provider. Document Revised: 08/12/2021 Document Reviewed: 08/12/2021 Elsevier Patient Education  2024 [employer].

## 2023-03-28 NOTE — Progress Notes (Signed)
Savannah Shaw is a 62 y.o. female presents to office today for annual physical exam examination.    Concerns today include: 1. Seizure disorder Patient notes that seizure disorder has been essentially stable but she still has breakthrough tremor of her legs and arms.  She thinks that she may want to see her old neurologist again to see if there are any newer treatments or if they recommend any dose adjustments of her current medications.  She is totally compliant with her medications.  2.  IBS-D Patient reports IBS-D is stable with Viberzi.  She would like to continue medications.  No rectal bleeding, nausea, vomiting or abdominal pain reported  Occupation: on disability, Marital status: single, Substance use: none Diet: typical [place] , Exercise: no structured Last colonoscopy: overdue Last mammogram: will scheduled mobile mammogram Last pap smear: needs, wants to defer to next visit Refills needed today: all Immunizations needed: Immunization History  Administered Date(s) Administered   COVID-19, mRNA, vaccine(Comirnaty)12 years and older 08/08/2022   Influenza Split 07/20/2013   Influenza,inj,Quad PF,6+ Mos 07/23/2015, 07/30/2016, 06/19/2017, 07/11/2018, 07/16/2019, 07/08/2021   Influenza-Unspecified 07/16/2019, 08/10/2020, 07/26/2022   Moderna Sars-Covid-2 Vaccination 12/31/2019, 01/28/2020, 06/23/2020, 02/23/2021   Td 12/12/2018   Tdap 12/12/2018   Zoster Recombinat (Shingrix) 06/12/2019, 08/30/2019     Past Medical History:  Diagnosis Date   GERD (gastroesophageal reflux disease)    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, controlled (HCC) neurologist-  dr Terrace Arabia ([facility])   last seizure 2008  (dx age 13)   History of acute pyelonephritis    w/ urosepsis 12-31-2015  resolved   History of kidney stones    Hyperlipidemia    Left ureteral stone    Normal exercise tolerance test results    12-24-2015 (dr hochrein)  no chest pain, no signficant arrhythmias , 85% max heartrate  achieved   Social History   Socioeconomic History   Marital status: Single    Spouse name: Not on file   Number of children: Not on file   Years of education: Not on file   Highest education level: Not on file  Occupational History   Occupation: Disabled    Comment: Seizures  Tobacco Use   Smoking status: Former    Packs/day: 1.00    Years: 5.00    Additional pack years: 0.00    Total pack years: 5.00    Types: Cigarettes    Quit date: 01/11/2011    Years since quitting: 12.2   Smokeless tobacco: Never  Vaping Use   Vaping Use: Some days  Substance and Sexual Activity   Alcohol use: No   Drug use: No   Sexual activity: Not on file  Other Topics Concern   Not on file  Social History Narrative   Daily Caffeine use - 3   Social Determinants of Health   Financial Resource Strain: Medium Risk (03/24/2023)   Overall Financial Resource Strain (CARDIA)    Difficulty of Paying Living Expenses: Somewhat hard  Food Insecurity: No Food Insecurity (03/24/2023)   Hunger Vital Sign    Worried About Running Out of Food in the Last Year: Never true    Ran Out of Food in the Last Year: Never true  Transportation Needs: No Transportation Needs (03/24/2023)   PRAPARE - Administrator, Civil Service (Medical): No    Lack of Transportation (Non-Medical): No  Physical Activity: Inactive (03/24/2023)   Exercise Vital Sign    Days of Exercise per Week: 1 day    Minutes  of Exercise per Session: 0 min  Stress: Stress Concern Present (03/24/2023)   [employer] of Occupational Health - Occupational Stress Questionnaire    Feeling of Stress : To some extent  Social Connections: Unknown (03/24/2023)   Social Connection and Isolation Panel [NHANES]    Frequency of Communication with Friends and Family: More than three times a week    Frequency of Social Gatherings with Friends and Family: Twice a week    Attends Religious Services: More than 4 times per year    Active Member of [employer]  or Organizations: Not on file    Attends Banker Meetings: Not on file    Marital Status: Divorced  Intimate Partner Violence: Not on file   Past Surgical History:  Procedure Laterality Date   APPENDECTOMY     CARDIOVASCULAR STRESS TEST  07-20-2011   normal nuclear study/  no ischemia, normal LV function and wall motion, ef 76%   CHOLECYSTECTOMY OPEN  1970's   w/  Appendectomy   CYSTOSCOPY W/ URETERAL STENT PLACEMENT Left 12/31/2015   Procedure: CYSTOSCOPY WITH RETROGRADE PYELOGRAM/URETERAL STENT PLACEMENT;  Surgeon: Malen Gauze, MD;  Location: [facility];  Service: Urology;  Laterality: Left;   CYSTOSCOPY W/ URETERAL STENT PLACEMENT Left 01/14/2016   Procedure: CYSTOSCOPY WITH STENT REPLACEMENT;  Surgeon: Malen Gauze, MD;  Location[facility];  Service: Urology;  Laterality: Left;   CYSTOSCOPY/RETROGRADE/URETEROSCOPY/STONE EXTRACTION WITH BASKET Left 01/14/2016   Procedure: CYSTOSCOPY/RETROGRADE/URETEROSCOPY/STONE EXTRACTION WITH BASKET;  Surgeon: Malen Gauze, MD;  Location: [facility];  Service: Urology;  Laterality: Left;   HOLMIUM LASER APPLICATION Left 01/14/2016   Procedure: HOLMIUM LASER APPLICATION;  Surgeon: Malen Gauze, MD;  Location: [facility];  Service: Urology;  Laterality: Left;   OOPHORECTOMY  1980's   Unilateral oophorectomy and  Bilateral Salpingectomy (tubal)   TONSILLECTOMY AND ADENOIDECTOMY  as child   TRANSTHORACIC ECHOCARDIOGRAM  07-20-2011   ef 55-65%/  trivial MR/  mild TR   Family History  Problem Relation Age of Onset   Diabetes Mother    Heart disease Mother 46       No details about the type of heart disease.     Prostate cancer Other    Colon cancer Neg Hx     Current Outpatient Medications:    amoxicillin-clavulanate (AUGMENTIN) 875-125 MG tablet, Take 1 tablet by mouth 2 (two) times daily., Disp: 20 tablet, Rfl: 0   cetirizine (ZYRTEC) 10 MG tablet, TAKE 1 TABLET BY  MOUTH AT BEDTIME, Disp: 90 tablet, Rfl: 1   divalproex (DEPAKOTE) 500 MG DR tablet, Take 2 tablets by mouth twice daily, Disp: 360 tablet, Rfl: 0   Eluxadoline (VIBERZI) 100 MG TABS, Take 1 tablet (100 mg total) by mouth 2 (two) times daily., Disp: 60 tablet, Rfl: 5   esomeprazole (NEXIUM) 40 MG capsule, Take 1 capsule (40 mg total) by mouth daily., Disp: 90 capsule, Rfl: 3   fluticasone (FLONASE) 50 MCG/ACT nasal spray, Use 2 spray(s) in each nostril once daily, Disp: 16 g, Rfl: 2   hydrOXYzine (ATARAX) 10 MG tablet, Take 1-2 tablets (10-20 mg total) by mouth every 8 (eight) hours as needed for anxiety., Disp: 30 tablet, Rfl: PRN   PROLIA 60 MG/ML SOSY injection, INJECT 60 MG INTO THE SKIN EVERY 6 MONTHS, Disp: 1 mL, Rfl: 0   rosuvastatin (CRESTOR) 20 MG tablet, Take 1 tablet (20 mg total) by mouth daily., Disp: 90 tablet, Rfl: 1   TEGRETOL  200 MG tablet, TAKE 1 & 1/2 (ONE & ONE-HALF) TABLETS BY MOUTH IN THE MORNING THEN 1 TAB WITH LUNCH THEN 1 & 1/2 (ONE & ONE-HALF) TAB IN THE EVENING, Disp: 360 tablet, Rfl: 0   zonisamide (ZONEGRAN) 100 MG capsule, Take 1 capsule by mouth once daily, Disp: 90 capsule, Rfl: 0  Allergies  Allergen Reactions   Terbinafine Hcl Rash    Lamasil   Topamax [Topiramate] Rash     ROS: Review of Systems Pertinent items noted in HPI and remainder of comprehensive ROS otherwise negative.    Physical exam BP 126/74   Pulse 70   Temp 98.3 F (36.8 C)   Ht 5\' 5"  (1.651 m)   Wt 152 lb (68.9 kg)   SpO2 93%   BMI 25.29 kg/m  General appearance: alert, cooperative, appears stated age, and no distress Head: Normocephalic, without obvious abnormality, atraumatic Eyes: negative findings: lids and lashes normal, conjunctivae and sclerae normal, corneas clear, and pupils equal, round, reactive to light and accomodation Ears: normal TM's and external ear canals both ears Nose: Nares normal. Septum midline. Mucosa normal. No drainage or sinus tenderness. Throat: lips,  mucosa, and tongue normal; teeth and gums normal Neck: no adenopathy, no carotid bruit, supple, symmetrical, trachea midline, and thyroid not enlarged, symmetric, no tenderness/mass/nodules Back: symmetric, no curvature. ROM normal. No CVA tenderness. Lungs: clear to auscultation bilaterally Heart: regular rate and rhythm, S1, S2 normal, no murmur, click, rub or gallop Abdomen: soft, non-tender; bowel sounds normal; no masses,  no organomegaly Extremities: extremities normal, atraumatic, no cyanosis or edema Pulses: 2+ and symmetric Skin: Skin color, texture, turgor normal. No rashes or lesions Lymph nodes: Cervical, supraclavicular, and axillary nodes normal. Neurologic: tremor of upper extremities appreciated     03/28/2023    8:31 AM 12/09/2022   11:00 AM 10/03/2022    2:04 PM  Depression screen PHQ 2/9  Decreased Interest 1 0 0  Down, Depressed, Hopeless 1 0 1  PHQ - 2 Score 2 0 1  Altered sleeping 3 0 0  Tired, decreased energy 2 0 1  Change in appetite 1 0 0  Feeling bad or failure about yourself  1 0 0  Trouble concentrating 0 0 1  Moving slowly or fidgety/restless 2 0 0  Suicidal thoughts 0 0 0  PHQ-9 Score 11 0 3  Difficult doing work/chores Somewhat difficult Not difficult at all Somewhat difficult      03/28/2023    8:31 AM 12/09/2022   11:00 AM 10/03/2022    2:05 PM 03/31/2022    2:01 PM  GAD 7 : Generalized Anxiety Score  Nervous, Anxious, on Edge 2 0 1 0  Control/stop worrying 1 0 1 0  Worry too much - different things 1 0 0 0  Trouble relaxing 1 0 1 0  Restless 0 0 0 0  Easily annoyed or irritable 2 0 1 0  Afraid - awful might happen 0 0 0 0  Total GAD 7 Score 7 0 4 0  Anxiety Difficulty Somewhat difficult Not difficult at all Somewhat difficult Not difficult at all      Assessment/ Plan: Donzetta Matters here for annual physical exam.   Annual physical exam  Age-related osteoporosis without current pathological fracture - Plan: CMP14+EGFR, VITAMIN D 25  Hydroxy (Vit-D Deficiency, Fractures)  Mixed hyperlipidemia - Plan: CMP14+EGFR, TSH  Seizure disorder (HCC) - Plan: CMP14+EGFR, CBC, Ambulatory referral to Neurology, divalproex (DEPAKOTE) 500 MG DR tablet, TEGRETOL 200 MG tablet,  zonisamide (ZONEGRAN) 100 MG capsule  Hearing difficulty of both ears - Plan: Ambulatory referral to Audiology  Irritable bowel syndrome with diarrhea - Plan: Eluxadoline (VIBERZI) 100 MG TABS, ToxASSURE Select 13 (MW), Urine  Controlled substance agreement signed - Plan: ToxASSURE Select 13 (MW), Urine  Stress at home  She wished to defer Pap smear to next visit and we will accommodate that.  She will set up mammogram with the mobile unit.  Check vitamin D, calcium level.  Continue Prolia for treatment of osteoporosis  Continue Crestor for cholesterol control.  Will be due for fasting lipid until the end of the year  CMP, CBC for medication monitoring the setting of seizure disorder.  Referral back to her neurologist placed.  Medications have been renewed  Referral to audiology for evaluation of hearing difficulty.  Discussed association of hearing loss with dementia if left untreated  IBS is chronic and stable.  Viberzi renewed.  UDS and CSC were updated as per office policy and the national narcotic database reviewed.  No red flags  Ongoing stress at home related to having an additional person living with her.  They will be moving out soon and she is hopeful to Stress will get better.  She has Atarax on hand if needed for breakthrough anxiety.  No refills needed at this time  Counseled on healthy lifestyle choices, including diet (rich in fruits, vegetables and lean meats and low in salt and simple carbohydrates) and exercise (at least 30 minutes of moderate physical activity daily).  Patient to follow up in 1 year for annual exam or sooner if needed.  Margarita Croke M. Nadine Counts, DO

## 2023-03-30 LAB — TOXASSURE SELECT 13 (MW), URINE

## 2023-04-06 ENCOUNTER — Other Ambulatory Visit: Payer: Self-pay | Admitting: Nurse Practitioner

## 2023-04-06 ENCOUNTER — Ambulatory Visit: Payer: Medicaid Other | Admitting: Nurse Practitioner

## 2023-04-06 DIAGNOSIS — E782 Mixed hyperlipidemia: Secondary | ICD-10-CM

## 2023-04-07 ENCOUNTER — Other Ambulatory Visit: Payer: Self-pay | Admitting: Nurse Practitioner

## 2023-04-07 DIAGNOSIS — J301 Allergic rhinitis due to pollen: Secondary | ICD-10-CM

## 2023-04-11 ENCOUNTER — Other Ambulatory Visit: Payer: Self-pay | Admitting: Family Medicine

## 2023-04-11 DIAGNOSIS — Z1231 Encounter for screening mammogram for malignant neoplasm of breast: Secondary | ICD-10-CM

## 2023-04-24 ENCOUNTER — Ambulatory Visit
Admission: RE | Admit: 2023-04-24 | Discharge: 2023-04-24 | Disposition: A | Discharge status: Home / Self Care | Payer: Medicaid Other | Source: Ambulatory Visit | Attending: Family Medicine | Admitting: Family Medicine

## 2023-04-24 DIAGNOSIS — Z1231 Encounter for screening mammogram for malignant neoplasm of breast: Secondary | ICD-10-CM | POA: Diagnosis not present

## 2023-05-16 ENCOUNTER — Ambulatory Visit: Payer: Medicaid Other | Admitting: Neurology

## 2023-06-24 ENCOUNTER — Other Ambulatory Visit: Payer: Self-pay | Admitting: Family Medicine

## 2023-06-29 ENCOUNTER — Telehealth: Payer: Self-pay | Admitting: Family Medicine

## 2023-06-29 NOTE — Telephone Encounter (Signed)
Prolia BIV in separate encounter. Will route encounter back once benefit verification is complete.

## 2023-06-29 NOTE — Telephone Encounter (Signed)
Please verify PROLIA benefits for patient.  Thank you.

## 2023-06-29 NOTE — Telephone Encounter (Signed)
Prolia VOB initiated via [email or web address]  Next Prolia inj DUE: 08/02/23

## 2023-07-03 ENCOUNTER — Ambulatory Visit: Payer: Medicaid Other | Admitting: Neurology

## 2023-07-03 ENCOUNTER — Encounter: Payer: Self-pay | Admitting: Neurology

## 2023-07-03 VITALS — BP 145/80 | HR 78 | Ht 65.0 in | Wt 157.0 lb

## 2023-07-03 DIAGNOSIS — R251 Tremor, unspecified: Secondary | ICD-10-CM | POA: Diagnosis not present

## 2023-07-03 DIAGNOSIS — Z1329 Encounter for screening for other suspected endocrine disorder: Secondary | ICD-10-CM | POA: Diagnosis not present

## 2023-07-03 DIAGNOSIS — E559 Vitamin D deficiency, unspecified: Secondary | ICD-10-CM | POA: Diagnosis not present

## 2023-07-03 DIAGNOSIS — E538 Deficiency of other specified B group vitamins: Secondary | ICD-10-CM | POA: Diagnosis not present

## 2023-07-03 DIAGNOSIS — G40301 Generalized idiopathic epilepsy and epileptic syndromes, not intractable, with status epilepticus: Secondary | ICD-10-CM | POA: Diagnosis not present

## 2023-07-03 DIAGNOSIS — R799 Abnormal finding of blood chemistry, unspecified: Secondary | ICD-10-CM | POA: Diagnosis not present

## 2023-07-03 DIAGNOSIS — E079 Disorder of thyroid, unspecified: Secondary | ICD-10-CM | POA: Diagnosis not present

## 2023-07-03 NOTE — Progress Notes (Signed)
Chief Complaint  Patient presents with   New Patient (Initial Visit)    Rm13, alone, NP Internal referral for seizure disorder:pt stated hasn't had grand mal in several years but feels twitching in legs and arms at night and in am and bilateral hand shaking       ASSESSMENT AND PLAN  Savannah Shaw is a 62 y.o. female   Epilepsy  MRI of the brain with and without contrast  EEG  Laboratory evaluations  Keep current dose of medications, Tegretol 300/200/300 mg, Depakote 500 mg 2 tablets twice a day, zonisamide 100 mg daily,  Depend on the result, we will adjusting her medications, her reported tremor likely related to Depakote use, not a good candidate for zonisamide due to history of kidney stone,  DIAGNOSTIC DATA (LABS, IMAGING, TESTING) - I reviewed patient records, labs, notes, testing and imaging myself where available.   MEDICAL HISTORY:  Savannah Shaw, is a 62 year old female seen in request by  Ignacia Bayley primary care physician Dr. Nadine Counts, Rozell Searing, for evaluation of seizure, she was brought in by her family member, but alone at today's visit July 03, 2023  I reviewed and summarized the referring note. PMHX. Epilepy, tegretol 200mg  1+1/2 bid, one at noon, Depakote 500mg  2 tab bid, zonisamide 100mg  qhs HLD History of kidney stone  She currently lives with her parents, went on disability many years ago because of seizure, her first seizure started around age 55, generalized clonic tonic seizure, but even before that, she reported frequent morning body jerks,  She was started on antiepileptic medication since then, initially phenobarbital, Depakote, then replaced phenobarbital with tach at home, because of recurrent seizure, zonisamide was added on, she has been on current combination for many years, Tegretol 200/300/200, Depakote 502 tablets twice a day, zonisamide 100 mg every night, she had a history of kidney stone, last generalized seizure she can remember  was more than 10 years ago.  She noticed gradual onset bilateral hands tremor, sometimes arm and leg twitching at nighttime   PHYSICAL EXAM:   Vitals:   07/03/23 1054  BP: (!) 145/80  Pulse: 78  Weight: 157 lb (71.2 kg)  Height: 5\' 5"  (1.651 m)   Body mass index is 26.13 kg/m.  PHYSICAL EXAMNIATION:  Gen: NAD, conversant, well nourised, well groomed                     Cardiovascular: Regular rate rhythm, no peripheral edema, warm, nontender. Eyes: Conjunctivae clear without exudates or hemorrhage Neck: Supple, no carotid bruits. Pulmonary: Clear to auscultation bilaterally   NEUROLOGICAL EXAM:  MENTAL STATUS: Speech/cognition: Slow reaction time, no dysarthria no aphasia CRANIAL NERVES: CN II: Visual fields are full to confrontation. Pupils are round equal and briskly reactive to light. CN III, IV, VI: extraocular movement are normal. No ptosis. CN V: Facial sensation is intact to light touch CN VII: Face is symmetric with normal eye closure  CN VIII: Hearing is normal to causal conversation. CN IX, X: Phonation is normal. CN XI: Head turning and shoulder shrug are intact  MOTOR: There is no pronator drift of out-stretched arms. Muscle bulk and tone are normal. Muscle strength is normal.  REFLEXES: Reflexes are 2+ and symmetric at the biceps, triceps, knees, and ankles. Plantar responses are flexor.  SENSORY: Intact to light touch, pinprick and vibratory sensation are intact in fingers and toes.  COORDINATION: There is no trunk or limb dysmetria noted.  GAIT/STANCE: Posture is normal.  Gait is steady with normal steps, base, arm swing, and turning. Heel and toe walking are normal. Tandem gait is normal.  Romberg is absent.  REVIEW OF SYSTEMS:  Full 14 system review of systems performed and notable only for as above All other review of systems were negative.   ALLERGIES: Allergies  Allergen Reactions   Terbinafine Hcl Rash    Lamasil   Topamax  [Topiramate] Rash    HOME MEDICATIONS: Current Outpatient Medications  Medication Sig Dispense Refill   cetirizine (ZYRTEC) 10 MG tablet TAKE 1 TABLET BY MOUTH AT BEDTIME 90 tablet 1   divalproex (DEPAKOTE) 500 MG DR tablet Take 2 tablets (1,000 mg total) by mouth 2 (two) times daily. 360 tablet 1   Eluxadoline (VIBERZI) 100 MG TABS Take 1 tablet (100 mg total) by mouth 2 (two) times daily. 60 tablet 5   esomeprazole (NEXIUM) 40 MG capsule Take 1 capsule (40 mg total) by mouth daily. 90 capsule 3   fluticasone (FLONASE) 50 MCG/ACT nasal spray Use 2 spray(s) in each nostril once daily 16 g 5   PROLIA 60 MG/ML SOSY injection INJECT 60 MG INTO THE SKIN EVERY 6 MONTHS 1 mL 0   rosuvastatin (CRESTOR) 20 MG tablet Take 1 tablet by mouth once daily 90 tablet 1   TEGRETOL 200 MG tablet TAKE 1 & 1/2 (ONE & ONE-HALF) TABLETS BY MOUTH IN THE MORNING THEN 1 TAB WITH LUNCH THEN 1 & 1/2 (ONE & ONE-HALF) TAB IN THE EVENING 360 tablet 1   zonisamide (ZONEGRAN) 100 MG capsule Take 1 capsule (100 mg total) by mouth daily. 90 capsule 1   No current facility-administered medications for this visit.    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, controlled [facility]) neurologist-  dr Terrace Arabia ([facility])   last seizure 2008  (dx age 53)   History of acute pyelonephritis    w/ urosepsis 12-31-2015  resolved   History of kidney stones    Hyperlipidemia    Left ureteral stone    Normal exercise tolerance test results    12-24-2015 (dr hochrein)  no chest pain, no signficant arrhythmias , 85% max heartrate achieved    PAST SURGICAL HISTORY: Past Surgical History:  Procedure Laterality Date   APPENDECTOMY     CARDIOVASCULAR STRESS TEST  07-20-2011   normal nuclear study/  no ischemia, normal LV function and wall motion, ef 76%   CHOLECYSTECTOMY OPEN  1970's   w/  Appendectomy   CYSTOSCOPY W/ URETERAL STENT PLACEMENT Left  12/31/2015   Procedure: CYSTOSCOPY WITH RETROGRADE PYELOGRAM/URETERAL STENT PLACEMENT;  Surgeon: Malen Gauze, MD;  Location: [facility];  Service: Urology;  Laterality: Left;   CYSTOSCOPY W/ URETERAL STENT PLACEMENT Left 01/14/2016   Procedure: CYSTOSCOPY WITH STENT REPLACEMENT;  Surgeon: Malen Gauze, MD;  Location[facility];  Service: Urology;  Laterality: Left;   CYSTOSCOPY/RETROGRADE/URETEROSCOPY/STONE EXTRACTION WITH BASKET Left 01/14/2016   Procedure: CYSTOSCOPY/RETROGRADE/URETEROSCOPY/STONE EXTRACTION WITH BASKET;  Surgeon: Malen Gauze, MD;  Location: [facility];  Service: Urology;  Laterality: Left;   HOLMIUM LASER APPLICATION Left 01/14/2016   Procedure: HOLMIUM LASER APPLICATION;  Surgeon: Malen Gauze, MD;  Location: [facility];  Service: Urology;  Laterality: Left;   OOPHORECTOMY  1980's   Unilateral oophorectomy and  Bilateral Salpingectomy (tubal)   TONSILLECTOMY AND ADENOIDECTOMY  as child   TRANSTHORACIC ECHOCARDIOGRAM  07-20-2011   ef  55-65%/  trivial MR/  mild TR    FAMILY HISTORY: Family History  Problem Relation Age of Onset   Diabetes Mother    Heart disease Mother 6       No details about the type of heart disease.     Breast cancer Paternal Aunt    Prostate cancer Other    Colon cancer Neg Hx     SOCIAL HISTORY: Social History   Socioeconomic History   Marital status: Single    Spouse name: Not on file   Number of children: Not on file   Years of education: Not on file   Highest education level: Not on file  Occupational History   Occupation: Disabled    Comment: Seizures  Tobacco Use   Smoking status: Some Days    Current packs/day: 0.00    Average packs/day: 1 pack/day for 5.0 years (5.0 ttl pk-yrs)    Types: Cigarettes    Start date: 01/10/2006    Last attempt to quit: 01/11/2011    Years since quitting: 12.4   Smokeless tobacco: Never  Vaping Use   Vaping status: Former   Substance and Sexual Activity   Alcohol use: No   Drug use: No   Sexual activity: Not on file  Other Topics Concern   Not on file  Social History Narrative   Daily Caffeine use - 3   Social Determinants of Health   Financial Resource Strain: Medium Risk (03/24/2023)   Overall Financial Resource Strain (CARDIA)    Difficulty of Paying Living Expenses: Somewhat hard  Food Insecurity: No Food Insecurity (03/24/2023)   Hunger Vital Sign    Worried About Running Out of Food in the Last Year: Never true    Ran Out of Food in the Last Year: Never true  Transportation Needs: No Transportation Needs (03/24/2023)   PRAPARE - Administrator, Civil Service (Medical): No    Lack of Transportation (Non-Medical): No  Physical Activity: Inactive (03/24/2023)   Exercise Vital Sign    Days of Exercise per Week: 1 day    Minutes of Exercise per Session: 0 min  Stress: Stress Concern Present (03/24/2023)   [employer] of Occupational Health - Occupational Stress Questionnaire    Feeling of Stress : To some extent  Social Connections: Unknown (03/24/2023)   Social Connection and Isolation Panel [NHANES]    Frequency of Communication with Friends and Family: More than three times a week    Frequency of Social Gatherings with Friends and Family: Twice a week    Attends Religious Services: More than 4 times per year    Active Member of [employer] or Organizations: Not on file    Attends Banker Meetings: Not on file    Marital Status: Divorced  Intimate Partner Violence: Not on file      Levert Feinstein, M.D. Ph.D.  [facility] [address] [place], [place] [address] Ph: [phone] Fax: [phone]  CC:  Raliegh Ip, DO [address]  [place] [address]  Raliegh Ip, DO

## 2023-07-04 ENCOUNTER — Telehealth: Payer: Self-pay | Admitting: Neurology

## 2023-07-04 LAB — CBC WITH DIFFERENTIAL/PLATELET
Basophils Absolute: 0 10*3/uL (ref 0.0–0.2)
Basos: 0 %
EOS (ABSOLUTE): 0 10*3/uL (ref 0.0–0.4)
Eos: 1 %
Hematocrit: 38 % (ref 34.0–46.6)
Hemoglobin: 12.8 g/dL (ref 11.1–15.9)
Immature Grans (Abs): 0 10*3/uL (ref 0.0–0.1)
Immature Granulocytes: 0 %
Lymphocytes Absolute: 2.6 10*3/uL (ref 0.7–3.1)
Lymphs: 52 %
MCH: 33.6 pg — ABNORMAL HIGH (ref 26.6–33.0)
MCHC: 33.7 g/dL (ref 31.5–35.7)
MCV: 100 fL — ABNORMAL HIGH (ref 79–97)
Monocytes Absolute: 0.5 10*3/uL (ref 0.1–0.9)
Monocytes: 10 %
Neutrophils Absolute: 1.8 10*3/uL (ref 1.4–7.0)
Neutrophils: 37 %
Platelets: 255 10*3/uL (ref 150–450)
RBC: 3.81 x10E6/uL (ref 3.77–5.28)
RDW: 12.4 % (ref 11.7–15.4)
WBC: 5 10*3/uL (ref 3.4–10.8)

## 2023-07-04 LAB — COMPREHENSIVE METABOLIC PANEL
ALT: 9 IU/L (ref 0–32)
AST: 19 IU/L (ref 0–40)
Albumin: 4.2 g/dL (ref 3.9–4.9)
Alkaline Phosphatase: 46 IU/L (ref 44–121)
BUN/Creatinine Ratio: 29 — ABNORMAL HIGH (ref 12–28)
BUN: 16 mg/dL (ref 8–27)
Bilirubin Total: <0.2 mg/dL (ref 0.0–1.2)
CO2: 23 mmol/L (ref 20–29)
Calcium: 9.4 mg/dL (ref 8.7–10.3)
Chloride: 103 mmol/L (ref 96–106)
Creatinine, Ser: 0.56 mg/dL — ABNORMAL LOW (ref 0.57–1.00)
Globulin, Total: 2.4 g/dL (ref 1.5–4.5)
Glucose: 93 mg/dL (ref 70–99)
Potassium: 4.6 mmol/L (ref 3.5–5.2)
Sodium: 139 mmol/L (ref 134–144)
Total Protein: 6.6 g/dL (ref 6.0–8.5)
eGFR: 103 mL/min/{1.73_m2} (ref >59)

## 2023-07-04 LAB — VALPROIC ACID LEVEL: Valproic Acid Lvl: 82 ug/mL (ref 50–100)

## 2023-07-04 LAB — RPR: RPR Ser Ql: NONREACTIVE

## 2023-07-04 LAB — TSH: TSH: 1.17 u[IU]/mL (ref 0.450–4.500)

## 2023-07-04 LAB — VITAMIN D 25 HYDROXY (VIT D DEFICIENCY, FRACTURES): Vit D, 25-Hydroxy: 22.2 ng/mL — ABNORMAL LOW (ref 30.0–100.0)

## 2023-07-04 LAB — CARBAMAZEPINE LEVEL, TOTAL: Carbamazepine (Tegretol), S: 9.6 ug/mL (ref 4.0–12.0)

## 2023-07-04 LAB — VITAMIN B12: Vitamin B-12: 440 pg/mL (ref 232–1245)

## 2023-07-04 LAB — ZONISAMIDE LEVEL: Zonisamide: 2.4 ug/mL — ABNORMAL LOW (ref 10.0–40.0)

## 2023-07-04 NOTE — Telephone Encounter (Signed)
error 

## 2023-07-07 ENCOUNTER — Telehealth: Payer: Self-pay | Admitting: Neurology

## 2023-07-07 NOTE — Telephone Encounter (Signed)
[employer] [place]: [id number] exp. 07/07/23-09/04/23 sent to GI [phone]

## 2023-07-12 ENCOUNTER — Other Ambulatory Visit (HOSPITAL_COMMUNITY): Payer: Self-pay

## 2023-07-12 NOTE — Telephone Encounter (Signed)
Pharmacy Patient Advocate Encounter  Insurance verification completed.    The patient is insured through [employer] [place] MEDICA[employer]   Ran test claims for: PROLIA 60MG .  Pharmacy benefit copay: $4   **This test claim was processed through [facility]- copay amounts may vary at other pharmacies due to pharmacy/plan contracts, or as the patient moves through the different stages of their insurance plan.**

## 2023-07-26 ENCOUNTER — Ambulatory Visit: Payer: Medicaid Other | Admitting: Neurology

## 2023-07-26 DIAGNOSIS — G40301 Generalized idiopathic epilepsy and epileptic syndromes, not intractable, with status epilepticus: Secondary | ICD-10-CM

## 2023-07-27 MED ORDER — PROLIA 60 MG/ML ~~LOC~~ SOSY: 60.0000 mg | PREFILLED_SYRINGE | SUBCUTANEOUS | 1 refills | Status: DC

## 2023-07-27 NOTE — Addendum Note (Signed)
Addended by: Tamera Punt on: 07/27/2023 10:06 AM   Modules accepted: Orders

## 2023-07-27 NOTE — Telephone Encounter (Signed)
Prolia sent to [facility] and patient is aware she is due after 08/05/23. Patient aware to call and schedule nurse visit when she picks up Prolia.

## 2023-08-07 ENCOUNTER — Encounter: Payer: Self-pay | Admitting: Neurology

## 2023-08-09 ENCOUNTER — Ambulatory Visit: Payer: Medicaid Other | Admitting: *Deleted

## 2023-08-09 DIAGNOSIS — M810 Age-related osteoporosis without current pathological fracture: Secondary | ICD-10-CM

## 2023-08-09 MED ORDER — DENOSUMAB 60 MG/ML ~~LOC~~ SOSY
60.0000 mg | PREFILLED_SYRINGE | Freq: Once | SUBCUTANEOUS | Status: AC
Start: 2023-08-09 — End: 2023-08-09
  Administered 2023-08-09: 60 mg via SUBCUTANEOUS

## 2023-08-09 NOTE — Progress Notes (Signed)
Prolia given SubQ right upper arm and pt tolerated well.

## 2023-08-15 ENCOUNTER — Ambulatory Visit
Admission: RE | Admit: 2023-08-15 | Discharge: 2023-08-15 | Disposition: A | Discharge status: Home / Self Care | Payer: Medicaid Other | Source: Ambulatory Visit | Attending: Neurology | Admitting: Neurology

## 2023-08-15 DIAGNOSIS — G40301 Generalized idiopathic epilepsy and epileptic syndromes, not intractable, with status epilepticus: Secondary | ICD-10-CM | POA: Diagnosis not present

## 2023-08-15 MED ORDER — GADOPICLENOL 0.5 MMOL/ML IV SOLN
7.5000 mL | Freq: Once | INTRAVENOUS | Status: AC | PRN
  Administered 2023-08-15: 7.5 mL via INTRAVENOUS

## 2023-08-22 NOTE — Procedures (Signed)
   HISTORY: 62 year old female with history of epilepsy, taking Depakote,  TECHNIQUE:  This is a routine 16 channel EEG recording with one channel devoted to a limited EKG recording.  It was performed during wakefulness, drowsiness and asleep.  Hyperventilation and photic stimulation were performed as activating procedures.  There are minimum muscle and movement artifact noted.  Upon maximum arousal, posterior dominant waking rhythm consistent of rhythmic theta range activity. Activities are symmetric over the bilateral posterior derivations and attenuated with eye opening.  Photic stimulation did not alter the tracing.  Hyperventilation produced mild/moderate buildup with higher amplitude and the slower activities noted.  During EEG recording, patient developed drowsiness and entered sleep, sleep EEG demonstrated architecture, there were frontal centrally dominant vertex waves and symmetric sleep spindles noted.  During EEG recording, there was no epileptiform discharge noted.  EKG demonstrate normal sinus rhythm.  CONCLUSION: This is a awake and asleep EEG.  There is no electrodiagnostic evidence of epileptiform discharge, there is mild background slowing indicating mild bihemispheric malfunction.   Levert Feinstein, M.D. Ph.D.  [facility] [address] [place], [place] [address] Phone: [phone] Fax:      [phone]

## 2023-10-02 ENCOUNTER — Encounter: Payer: Self-pay | Admitting: Neurology

## 2023-10-02 ENCOUNTER — Other Ambulatory Visit: Payer: Self-pay | Admitting: Nurse Practitioner

## 2023-10-02 ENCOUNTER — Ambulatory Visit: Payer: Medicaid Other | Admitting: Neurology

## 2023-10-02 VITALS — BP 140/83 | HR 94 | Ht 65.0 in | Wt 160.0 lb

## 2023-10-02 DIAGNOSIS — J301 Allergic rhinitis due to pollen: Secondary | ICD-10-CM

## 2023-10-02 DIAGNOSIS — G40909 Epilepsy, unspecified, not intractable, without status epilepticus: Secondary | ICD-10-CM | POA: Diagnosis not present

## 2023-10-02 DIAGNOSIS — G40301 Generalized idiopathic epilepsy and epileptic syndromes, not intractable, with status epilepticus: Secondary | ICD-10-CM | POA: Diagnosis not present

## 2023-10-02 DIAGNOSIS — R251 Tremor, unspecified: Secondary | ICD-10-CM | POA: Diagnosis not present

## 2023-10-02 MED ORDER — LAMOTRIGINE 100 MG PO TABS: 100.0000 mg | ORAL_TABLET | Freq: Two times a day (BID) | ORAL | 11 refills | Status: DC

## 2023-10-02 MED ORDER — LAMOTRIGINE 25 MG PO TABS: ORAL_TABLET | ORAL | 0 refills | Status: DC

## 2023-10-02 MED ORDER — DIVALPROEX SODIUM 500 MG PO DR TAB
1000.0000 mg | DELAYED_RELEASE_TABLET | Freq: Two times a day (BID) | ORAL | 3 refills | Status: DC
Start: 2023-10-02 — End: 2024-06-27

## 2023-10-02 NOTE — Patient Instructions (Addendum)
Meds ordered this encounter  Medications   lamoTRIgine (LAMICTAL) 25 MG tablet    Sig: 1 twice a day x1wk 2 twice a day x 2nd wk 3 twice a day x3rd wk    Dispense:  84 tablet    Refill:  0   lamoTRIgine (LAMICTAL) 100 MG tablet    Sig: Take 1 tablet (100 mg total) by mouth 2 (two) times daily.    Dispense:  60 tablet    Refill:  11    Fill her Lamotrigine 25mg  first. Give her hard copy   divalproex (DEPAKOTE) 500 MG DR tablet    Sig: Take 2 tablets (1,000 mg total) by mouth 2 (two) times daily.    Dispense:  360 tablet    Refill:  3      Overlap zonisamide 100mg  every night x2 weeks, then stop. Lamotrigine 100mg  twice a day Keep Depakote Dr 500mg  2 twice a day Keep tegretol 200mg   1+1/2 in am, 1 at noon, 1+1/2 at night.

## 2023-10-02 NOTE — Progress Notes (Signed)
Chief Complaint  Patient presents with   Seizures    Rm15, dad present, Sz: believes had sz in middle of night this past week. Denied missed doses of sz medication. Pt feels like constantly has jerking sensations      ASSESSMENT AND PLAN  Savannah Shaw is a 62 y.o. female   Epilepsy  MRI of the brain with and without contrast in October 2024 showed no significant abnormality  EEG in October 2024 showed mild background slowing,  Laboratory evaluations in September 2024 showed normal Depakote level 82, taking Depakote DR 500 mg 2 tablets twice a day, Tegretol level of 9.6, taking 300/200/300, zonisamide level was 2.5 taking 100 mg at nighttime  Suspicious for absence seizure based on history, or recurrent seizure at sleep in December 2024, history of kidney stone,  Decided to taper off zonisamide, add on lamotrigine titrating from 25 to 100 mg twice a day keep current dose of Depakote, and Tegretol,  If she doing very well, may consider taper off Tegretol  to avoid long-term side effect  DIAGNOSTIC DATA (LABS, IMAGING, TESTING) - I reviewed patient records, labs, notes, testing and imaging myself where available.   MEDICAL HISTORY:  Savannah Shaw, is a 62 year old female seen in request by  Ignacia Bayley primary care physician Dr. Nadine Counts, Rozell Searing, for evaluation of seizure, she was brought in by her family member, but alone at today's visit July 03, 2023  I reviewed and summarized the referring note. PMHX. Epilepy, tegretol 200mg  1+1/2 bid, one at noon, Depakote 500mg  2 tab bid, zonisamide 100mg  qhs HLD History of kidney stone  She currently lives with her parents, went on disability many years ago because of seizure, her first seizure started around age 63, generalized clonic tonic seizure, but even before that, she reported frequent morning body jerks,  She was started on antiepileptic medication since then, initially phenobarbital, Depakote, then replaced  phenobarbital with tegretol, because of recurrent seizure, zonisamide was added on, she has been on current combination for many years, Tegretol 300/200/300, Depakote 500 2 tablets twice a day, zonisamide 100 mg every night, she had a history of kidney stone, last generalized seizure she can remember was more than 10 years ago.  She noticed gradual onset bilateral hands tremor, sometimes arm and leg twitching at nighttime  UPDATE Dec 16th 2024: She is accompanied by her father at today's clinical visit, complains of possible seizure about a week ago, she stayed with her girlfriend, in the middle of the night, her girlfriend heard loud noise, but she was difficult to be awakened, she has no recollection of the event, was told she was confused, she did feel very tired the next day,  Father also reported couple times each week, she was noted to have slow reaction time to question,  then slowly came around, was no noticeable clinical seizure activity,   Abou a aweek ago, once a around, she sat around body shking then she will calm down, confused after that, stat looking around, could not get her throught togather, at least 2-3 times a week, lasgint for few second, next day,  EEG in October 2024 showed mild background slowing, history of kidney stone, on zonisamide 100 mg daily, was on Depakote, and Tegretol,  PHYSICAL EXAM:   Vitals:   10/02/23 1021  BP: (!) 140/83  Pulse: 94  Weight: 160 lb (72.6 kg)  Height: 5\' 5"  (1.651 m)   Body mass index is 26.63 kg/m.  PHYSICAL EXAMNIATION:  Gen: NAD, conversant, well nourised, well groomed                     Cardiovascular: Regular rate rhythm, no peripheral edema, warm, nontender. Eyes: Conjunctivae clear without exudates or hemorrhage Neck: Supple, no carotid bruits. Pulmonary: Clear to auscultation bilaterally   NEUROLOGICAL EXAM:  MENTAL STATUS: Speech/cognition: Slow reaction time, no dysarthria no aphasia CRANIAL NERVES: CN II:  Visual fields are full to confrontation. Pupils are round equal and briskly reactive to light. CN III, IV, VI: extraocular movement are normal. No ptosis. CN V: Facial sensation is intact to light touch CN VII: Face is symmetric with normal eye closure  CN VIII: Hearing is normal to causal conversation. CN IX, X: Phonation is normal. CN XI: Head turning and shoulder shrug are intact  MOTOR: There is no pronator drift of out-stretched arms. Muscle bulk and tone are normal. Muscle strength is normal.  REFLEXES: Reflexes are 2+ and symmetric at the biceps, triceps, knees, and ankles. Plantar responses are flexor.  SENSORY: Intact to light touch, pinprick and vibratory sensation are intact in fingers and toes.  COORDINATION: There is no trunk or limb dysmetria noted.  GAIT/STANCE: Posture is normal. Gait is steady with normal steps, base, arm swing, and turning. Heel and toe walking are normal. Tandem gait is normal.  Romberg is absent.  REVIEW OF SYSTEMS:  Full 14 system review of systems performed and notable only for as above All other review of systems were negative.   ALLERGIES: Allergies  Allergen Reactions   Terbinafine Hcl Rash    Lamasil   Topamax [Topiramate] Rash    HOME MEDICATIONS: Current Outpatient Medications  Medication Sig Dispense Refill   cetirizine (ZYRTEC) 10 MG tablet TAKE 1 TABLET BY MOUTH AT BEDTIME 90 tablet 1   divalproex (DEPAKOTE) 500 MG DR tablet Take 2 tablets (1,000 mg total) by mouth 2 (two) times daily. 360 tablet 1   Eluxadoline (VIBERZI) 100 MG TABS Take 1 tablet (100 mg total) by mouth 2 (two) times daily. 60 tablet 5   esomeprazole (NEXIUM) 40 MG capsule Take 1 capsule (40 mg total) by mouth daily. 90 capsule 3   fluticasone (FLONASE) 50 MCG/ACT nasal spray Use 2 spray(s) in each nostril once daily 16 g 5   PROLIA 60 MG/ML SOSY injection Inject 60 mg into the skin every 6 (six) months. 1 mL 1   rosuvastatin (CRESTOR) 20 MG tablet Take 1  tablet by mouth once daily 90 tablet 1   TEGRETOL 200 MG tablet TAKE 1 & 1/2 (ONE & ONE-HALF) TABLETS BY MOUTH IN THE MORNING THEN 1 TAB WITH LUNCH THEN 1 & 1/2 (ONE & ONE-HALF) TAB IN THE EVENING 360 tablet 1   zonisamide (ZONEGRAN) 100 MG capsule Take 1 capsule (100 mg total) by mouth daily. 90 capsule 1   No current facility-administered medications for this visit.    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, controlled [facility]) neurologist-  dr Terrace Arabia ([facility])   last seizure 2008  (dx age 35)   History of acute pyelonephritis    w/ urosepsis 12-31-2015  resolved   History of kidney stones    Hyperlipidemia    Left ureteral stone    Normal exercise tolerance test results    12-24-2015 (dr hochrein)  no chest pain, no signficant arrhythmias , 85% max heartrate achieved    PAST SURGICAL HISTORY:  Past Surgical History:  Procedure Laterality Date   APPENDECTOMY     CARDIOVASCULAR STRESS TEST  07-20-2011   normal nuclear study/  no ischemia, normal LV function and wall motion, ef 76%   CHOLECYSTECTOMY OPEN  1970's   w/  Appendectomy   CYSTOSCOPY W/ URETERAL STENT PLACEMENT Left 12/31/2015   Procedure: CYSTOSCOPY WITH RETROGRADE PYELOGRAM/URETERAL STENT PLACEMENT;  Surgeon: Malen Gauze, MD;  Location: [facility];  Service: Urology;  Laterality: Left;   CYSTOSCOPY W/ URETERAL STENT PLACEMENT Left 01/14/2016   Procedure: CYSTOSCOPY WITH STENT REPLACEMENT;  Surgeon: Malen Gauze, MD;  Location: [facility];  Service: Urology;  Laterality: Left;   CYSTOSCOPY/RETROGRADE/URETEROSCOPY/STONE EXTRACTION WITH BASKET Left 01/14/2016   Procedure: CYSTOSCOPY/RETROGRADE/URETEROSCOPY/STONE EXTRACTION WITH BASKET;  Surgeon: Malen Gauze, MD;  Location: [facility];  Service: Urology;  Laterality: Left;   HOLMIUM LASER APPLICATION Left 01/14/2016   Procedure: HOLMIUM LASER  APPLICATION;  Surgeon: Malen Gauze, MD;  Location: [facility];  Service: Urology;  Laterality: Left;   OOPHORECTOMY  1980's   Unilateral oophorectomy and  Bilateral Salpingectomy (tubal)   TONSILLECTOMY AND ADENOIDECTOMY  as child   TRANSTHORACIC ECHOCARDIOGRAM  07-20-2011   ef 55-65%/  trivial MR/  mild TR    FAMILY HISTORY: Family History  Problem Relation Age of Onset   Diabetes Mother    Heart disease Mother 34       No details about the type of heart disease.     Breast cancer Paternal Aunt    Prostate cancer Other    Colon cancer Neg Hx     SOCIAL HISTORY: Social History   Socioeconomic History   Marital status: Single    Spouse name: Not on file   Number of children: Not on file   Years of education: Not on file   Highest education level: Not on file  Occupational History   Occupation: Disabled    Comment: Seizures  Tobacco Use   Smoking status: Some Days    Current packs/day: 0.00    Average packs/day: 1 pack/day for 5.0 years (5.0 ttl pk-yrs)    Types: Cigarettes    Start date: 01/10/2006    Last attempt to quit: 01/11/2011    Years since quitting: 12.7   Smokeless tobacco: Never  Vaping Use   Vaping status: Former  Substance and Sexual Activity   Alcohol use: No   Drug use: No   Sexual activity: Not on file  Other Topics Concern   Not on file  Social History Narrative   Daily Caffeine use - 3   Social Drivers of Health   Financial Resource Strain: Medium Risk (03/24/2023)   Overall Financial Resource Strain (CARDIA)    Difficulty of Paying Living Expenses: Somewhat hard  Food Insecurity: No Food Insecurity (03/24/2023)   Hunger Vital Sign    Worried About Running Out of Food in the Last Year: Never true    Ran Out of Food in the Last Year: Never true  Transportation Needs: No Transportation Needs (03/24/2023)   PRAPARE - Administrator, Civil Service (Medical): No    Lack of Transportation (Non-Medical): No  Physical  Activity: Inactive (03/24/2023)   Exercise Vital Sign    Days of Exercise per Week: 1 day    Minutes of Exercise per Session: 0 min  Stress: Stress Concern Present (03/24/2023)   [employer] of Occupational Health - Occupational Stress Questionnaire    Feeling of Stress :  To some extent  Social Connections: Unknown (03/24/2023)   Social Connection and Isolation Panel [NHANES]    Frequency of Communication with Friends and Family: More than three times a week    Frequency of Social Gatherings with Friends and Family: Twice a week    Attends Religious Services: More than 4 times per year    Active Member of [employer] or Organizations: Not on file    Attends Banker Meetings: Not on file    Marital Status: Divorced  Intimate Partner Violence: Not on file      Levert Feinstein, M.D. Ph.D.  [facility] [address] [place], [place] [address] Ph: [phone] Fax: [phone]  CC:  Raliegh Ip, DO [address]  [place] [address]  Raliegh Ip, DO

## 2023-10-14 ENCOUNTER — Other Ambulatory Visit: Payer: Self-pay | Admitting: Family Medicine

## 2023-10-14 DIAGNOSIS — K580 Irritable bowel syndrome with diarrhea: Secondary | ICD-10-CM

## 2023-10-19 ENCOUNTER — Other Ambulatory Visit: Payer: Self-pay | Admitting: *Deleted

## 2023-10-19 ENCOUNTER — Other Ambulatory Visit: Payer: Self-pay | Admitting: Family Medicine

## 2023-10-19 ENCOUNTER — Telehealth: Payer: Self-pay

## 2023-10-19 DIAGNOSIS — K580 Irritable bowel syndrome with diarrhea: Secondary | ICD-10-CM

## 2023-10-19 MED ORDER — VIBERZI 100 MG PO TABS
1.0000 | ORAL_TABLET | Freq: Two times a day (BID) | ORAL | 5 refills | Status: DC
Start: 2023-10-19 — End: 2023-10-23

## 2023-10-19 MED ORDER — VIBERZI 100 MG PO TABS
1.0000 | ORAL_TABLET | Freq: Two times a day (BID) | ORAL | 5 refills | Status: DC
Start: 2023-10-19 — End: 2023-10-19

## 2023-10-19 NOTE — Telephone Encounter (Signed)
 Copied from CRM [id number]. Topic: Clinical - Prescription Issue >> Oct 19, 2023 12:36 PM Delon T wrote: Reason for CRM: Still waiting for VIBERZI  100 MG TABS [Pharmacy Med Name: [facility]  100 MG refill to be done, will be out of meds tomorrow. Pharmacy has sent request. Please call patient [phone] with any questions

## 2023-10-23 ENCOUNTER — Telehealth: Payer: Self-pay | Admitting: Family Medicine

## 2023-10-23 DIAGNOSIS — K580 Irritable bowel syndrome with diarrhea: Secondary | ICD-10-CM

## 2023-10-23 MED ORDER — VIBERZI 100 MG PO TABS
1.0000 | ORAL_TABLET | Freq: Two times a day (BID) | ORAL | 1 refills | Status: DC
Start: 2023-10-23 — End: 2023-10-24

## 2023-10-23 NOTE — Telephone Encounter (Signed)
 Copied from CRM [id number]. Topic: Clinical - Prescription Issue >> Oct 23, 2023  3:37 PM Savannah Shaw ORN wrote: Reason for CRM: Patient is following up on her prescription for Eluxadoline  (VIBERZI ) 100 MG TABS. She states she was told that the doctor would sign off on it & send it. Pt states pharmacy still hasn't gotten it. Advised pt that prescription is awaiting provider's signature. Pt states she will be out of medication tomorrow. Please notify pt once this has been sent to pharmacy. CB #: [phone].

## 2023-10-24 ENCOUNTER — Other Ambulatory Visit: Payer: Self-pay | Admitting: Family Medicine

## 2023-10-24 DIAGNOSIS — K580 Irritable bowel syndrome with diarrhea: Secondary | ICD-10-CM

## 2023-10-24 MED ORDER — VIBERZI 100 MG PO TABS
1.0000 | ORAL_TABLET | Freq: Two times a day (BID) | ORAL | 1 refills | Status: DC
Start: 2023-10-24 — End: 2023-10-30

## 2023-10-24 NOTE — Telephone Encounter (Signed)
 Pt came back to the office to check on this refill, was informed of the below and this will not be refill until her visit on 10/30/23

## 2023-10-24 NOTE — Telephone Encounter (Signed)
 Again, this is a CONTROLLED substance.  It HAS to be refilled DURING an OV.  This has been explained to patient before.  She has to keep/ schedule her OVs accordingly.

## 2023-10-24 NOTE — Addendum Note (Signed)
 Addended by: Julious Payer D on: 10/24/2023 01:04 PM   Modules accepted: Orders

## 2023-10-24 NOTE — Telephone Encounter (Signed)
 Patient informed prescription was sent to pharmacy on 10/23/23.

## 2023-10-30 ENCOUNTER — Telehealth: Payer: Self-pay | Admitting: Neurology

## 2023-10-30 ENCOUNTER — Encounter: Payer: Self-pay | Admitting: Family Medicine

## 2023-10-30 ENCOUNTER — Ambulatory Visit: Payer: Medicaid Other | Admitting: Family Medicine

## 2023-10-30 VITALS — BP 124/72 | HR 70 | Temp 97.9°F | Ht 65.0 in | Wt 154.0 lb

## 2023-10-30 DIAGNOSIS — Z1211 Encounter for screening for malignant neoplasm of colon: Secondary | ICD-10-CM | POA: Diagnosis not present

## 2023-10-30 DIAGNOSIS — R251 Tremor, unspecified: Secondary | ICD-10-CM

## 2023-10-30 DIAGNOSIS — K580 Irritable bowel syndrome with diarrhea: Secondary | ICD-10-CM

## 2023-10-30 DIAGNOSIS — Z0001 Encounter for general adult medical examination with abnormal findings: Secondary | ICD-10-CM

## 2023-10-30 DIAGNOSIS — K219 Gastro-esophageal reflux disease without esophagitis: Secondary | ICD-10-CM | POA: Diagnosis not present

## 2023-10-30 DIAGNOSIS — G40909 Epilepsy, unspecified, not intractable, without status epilepticus: Secondary | ICD-10-CM | POA: Diagnosis not present

## 2023-10-30 DIAGNOSIS — J329 Chronic sinusitis, unspecified: Secondary | ICD-10-CM

## 2023-10-30 DIAGNOSIS — E782 Mixed hyperlipidemia: Secondary | ICD-10-CM | POA: Diagnosis not present

## 2023-10-30 DIAGNOSIS — Z0000 Encounter for general adult medical examination without abnormal findings: Secondary | ICD-10-CM

## 2023-10-30 LAB — LIPID PANEL
Chol/HDL Ratio: 2.4 {ratio} (ref 0.0–4.4)
Cholesterol, Total: 153 mg/dL (ref 100–199)
HDL: 63 mg/dL (ref >39)
LDL Chol Calc (NIH): 73 mg/dL (ref 0–99)
Triglycerides: 95 mg/dL (ref 0–149)
VLDL Cholesterol Cal: 17 mg/dL (ref 5–40)

## 2023-10-30 LAB — CMP14+EGFR
ALT: 8 [IU]/L (ref 0–32)
AST: 20 [IU]/L (ref 0–40)
Albumin: 4.3 g/dL (ref 3.9–4.9)
Alkaline Phosphatase: 43 [IU]/L — ABNORMAL LOW (ref 44–121)
BUN/Creatinine Ratio: 22 (ref 12–28)
BUN: 16 mg/dL (ref 8–27)
Bilirubin Total: <0.2 mg/dL (ref 0.0–1.2)
CO2: 23 mmol/L (ref 20–29)
Calcium: 9.5 mg/dL (ref 8.7–10.3)
Chloride: 103 mmol/L (ref 96–106)
Creatinine, Ser: 0.74 mg/dL (ref 0.57–1.00)
Globulin, Total: 2.5 g/dL (ref 1.5–4.5)
Glucose: 95 mg/dL (ref 70–99)
Potassium: 5 mmol/L (ref 3.5–5.2)
Sodium: 140 mmol/L (ref 134–144)
Total Protein: 6.8 g/dL (ref 6.0–8.5)
eGFR: 91 mL/min/{1.73_m2} (ref >59)

## 2023-10-30 LAB — CBC
Hematocrit: 41.3 % (ref 34.0–46.6)
Hemoglobin: 13.9 g/dL (ref 11.1–15.9)
MCH: 33.7 pg — ABNORMAL HIGH (ref 26.6–33.0)
MCHC: 33.7 g/dL (ref 31.5–35.7)
MCV: 100 fL — ABNORMAL HIGH (ref 79–97)
Platelets: 246 10*3/uL (ref 150–450)
RBC: 4.12 x10E6/uL (ref 3.77–5.28)
RDW: 12.7 % (ref 11.7–15.4)
WBC: 5.2 10*3/uL (ref 3.4–10.8)

## 2023-10-30 MED ORDER — ROSUVASTATIN CALCIUM 20 MG PO TABS
20.0000 mg | ORAL_TABLET | Freq: Every day | ORAL | 3 refills | Status: AC
Start: 2023-10-30 — End: ?

## 2023-10-30 MED ORDER — CEFDINIR 300 MG PO CAPS
300.0000 mg | ORAL_CAPSULE | Freq: Two times a day (BID) | ORAL | 0 refills | Status: DC
Start: 2023-10-30 — End: 2024-01-30

## 2023-10-30 MED ORDER — VIBERZI 100 MG PO TABS
1.0000 | ORAL_TABLET | Freq: Two times a day (BID) | ORAL | 1 refills | Status: AC
Start: 2023-10-30 — End: ?

## 2023-10-30 MED ORDER — ESOMEPRAZOLE MAGNESIUM 40 MG PO CPDR: 40.0000 mg | DELAYED_RELEASE_CAPSULE | Freq: Every day | ORAL | 3 refills | Status: DC

## 2023-10-30 NOTE — Telephone Encounter (Signed)
 Dr. Terrace Arabia is tapering patient off of tregetol.

## 2023-10-30 NOTE — Progress Notes (Signed)
 Savannah Shaw is a 64 y.o. female presents to office today for annual physical exam examination.    Concerns today include: 1.  Seizure disorder She saw her neurologist last month.  She was discontinued on the Zonegran  and started on Lamictal .  She is titrated this up each week and finally got up to 100 mg twice daily about 2 weeks ago.  She was tolerating medication without difficulty until she got to the 100 mg twice daily and now she notes that she has been having some lightheaded symptoms as well as increasing tremor.  She has not reached out to neurology but plans to do so.  She does not report any falls but feels like she could fall.  She was continued on Tegretol  and Depakote  as prescribed.  She continues to have intermittent leg jerking  2.  Irritable bowel syndrome with diarrhea She has had breakthrough diarrheal episodes since coming off of the Viberzi .  She would like to restart medication.  She reports no rectal bleeding.  Wants to hold off on colonoscopy because she has multiple medical issues going on in the family that she wants to take care of first.  3.  Cold symptoms She reports 2-week history of progressive cold symptoms.  Her cough is now productive with brownish streaking in the sputum.  Denies overt hemoptysis.  No fevers.  No chest pain.  She reports no shortness of breath.  Not utilizing any over-the-counter's for this because of concerns that it may interact with her medications.  She had a sick daughter and granddaughter recently with colds.  4. Preventative care Again wants to defer colonoscopy for now.  Would also like to defer Pap smear until next visit.  Declines pneumococcal vaccination.  Substance use: none Health Maintenance Due  Topic Date Due   Pneumococcal Vaccine 37-59 Years old (1 of 2 - PCV) Never done   Cervical Cancer Screening (Pap smear)  07/12/2019   Colonoscopy  09/19/2019   COVID-19 Vaccine (6 - 2024-25 season) 06/18/2023   Refills needed today:  all  Immunization History  Administered Date(s) Administered   Influenza Split 07/20/2013   Influenza,inj,Quad PF,6+ Mos 07/23/2015, 07/30/2016, 06/19/2017, 07/11/2018, 07/16/2019, 07/08/2021   Influenza-Unspecified 07/16/2019, 08/10/2020, 07/26/2022   Moderna Sars-Covid-2 Vaccination 12/31/2019, 01/28/2020, 06/23/2020, 02/23/2021   Pfizer(Comirnaty)Fall Seasonal Vaccine 12 years and older 08/08/2022   Td 12/12/2018   Tdap 12/12/2018   Zoster Recombinant(Shingrix ) 06/12/2019, 08/30/2019   Past Medical History:  Diagnosis Date   GERD (gastroesophageal reflux disease)    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, controlled (HCC) neurologist-  dr onita ([facility])   last seizure 2008  (dx age 20)   History of acute pyelonephritis    w/ urosepsis 12-31-2015  resolved   History of kidney stones    Hyperlipidemia    Left ureteral stone    Normal exercise tolerance test results    12-24-2015 (dr hochrein)  no chest pain, no signficant arrhythmias , 85% max heartrate achieved   Social History   Socioeconomic History   Marital status: Single    Spouse name: Not on file   Number of children: Not on file   Years of education: Not on file   Highest education level: Not on file  Occupational History   Occupation: Disabled    Comment: Seizures  Tobacco Use   Smoking status: Some Days    Current packs/day: 0.00    Average packs/day: 1 pack/day for 5.0 years (5.0 ttl pk-yrs)    Types: Cigarettes  Start date: 01/10/2006    Last attempt to quit: 01/11/2011    Years since quitting: 12.8   Smokeless tobacco: Never  Vaping Use   Vaping status: Former  Substance and Sexual Activity   Alcohol use: No   Drug use: No   Sexual activity: Not on file  Other Topics Concern   Not on file  Social History Narrative   Daily Caffeine use - 3   Social Drivers of Health   Financial Resource Strain: Medium Risk (03/24/2023)   Overall Financial Resource Strain (CARDIA)    Difficulty of Paying Living Expenses:  Somewhat hard  Food Insecurity: No Food Insecurity (03/24/2023)   Hunger Vital Sign    Worried About Running Out of Food in the Last Year: Never true    Ran Out of Food in the Last Year: Never true  Transportation Needs: No Transportation Needs (03/24/2023)   PRAPARE - Administrator, Civil Service (Medical): No    Lack of Transportation (Non-Medical): No  Physical Activity: Inactive (03/24/2023)   Exercise Vital Sign    Days of Exercise per Week: 1 day    Minutes of Exercise per Session: 0 min  Stress: Stress Concern Present (03/24/2023)   [employer] of Occupational Health - Occupational Stress Questionnaire    Feeling of Stress : To some extent  Social Connections: Unknown (03/24/2023)   Social Connection and Isolation Panel [NHANES]    Frequency of Communication with Friends and Family: More than three times a week    Frequency of Social Gatherings with Friends and Family: Twice a week    Attends Religious Services: More than 4 times per year    Active Member of [employer] or Organizations: Not on file    Attends Banker Meetings: Not on file    Marital Status: Divorced  Intimate Partner Violence: Not on file   Past Surgical History:  Procedure Laterality Date   APPENDECTOMY     CARDIOVASCULAR STRESS TEST  07-20-2011   normal nuclear study/  no ischemia, normal LV function and wall motion, ef 76%   CHOLECYSTECTOMY OPEN  1970's   w/  Appendectomy   CYSTOSCOPY W/ URETERAL STENT PLACEMENT Left 12/31/2015   Procedure: CYSTOSCOPY WITH RETROGRADE PYELOGRAM/URETERAL STENT PLACEMENT;  Surgeon: Belvie LITTIE Clara, MD;  Location: [facility];  Service: Urology;  Laterality: Left;   CYSTOSCOPY W/ URETERAL STENT PLACEMENT Left 01/14/2016   Procedure: CYSTOSCOPY WITH STENT REPLACEMENT;  Surgeon: Belvie LITTIE Clara, MD;  Location: [facility];  Service: Urology;  Laterality: Left;   CYSTOSCOPY/RETROGRADE/URETEROSCOPY/STONE EXTRACTION WITH BASKET Left 01/14/2016    Procedure: CYSTOSCOPY/RETROGRADE/URETEROSCOPY/STONE EXTRACTION WITH BASKET;  Surgeon: Belvie LITTIE Clara, MD;  Location: [facility];  Service: Urology;  Laterality: Left;   HOLMIUM LASER APPLICATION Left 01/14/2016   Procedure: HOLMIUM LASER APPLICATION;  Surgeon: Belvie LITTIE Clara, MD;  Location: [facility];  Service: Urology;  Laterality: Left;   OOPHORECTOMY  1980's   Unilateral oophorectomy and  Bilateral Salpingectomy (tubal)   TONSILLECTOMY AND ADENOIDECTOMY  as child   TRANSTHORACIC ECHOCARDIOGRAM  07-20-2011   ef 55-65%/  trivial MR/  mild TR   Family History  Problem Relation Age of Onset   Diabetes Mother    Heart disease Mother 22       No details about the type of heart disease.     Breast cancer Paternal Aunt    Prostate cancer Other    Colon cancer Neg Hx  Current Outpatient Medications:    cetirizine  (ZYRTEC ) 10 MG tablet, TAKE 1 TABLET BY MOUTH AT BEDTIME, Disp: 90 tablet, Rfl: 1   divalproex  (DEPAKOTE ) 500 MG DR tablet, Take 2 tablets (1,000 mg total) by mouth 2 (two) times daily., Disp: 360 tablet, Rfl: 3   Eluxadoline  (VIBERZI ) 100 MG TABS, Take 1 tablet (100 mg total) by mouth 2 (two) times daily., Disp: 60 tablet, Rfl: 1   esomeprazole  (NEXIUM ) 40 MG capsule, Take 1 capsule (40 mg total) by mouth daily., Disp: 90 capsule, Rfl: 3   fluticasone  (FLONASE ) 50 MCG/ACT nasal spray, Use 2 spray(s) in each nostril once daily, Disp: 16 g, Rfl: 5   lamoTRIgine  (LAMICTAL ) 100 MG tablet, Take 1 tablet (100 mg total) by mouth 2 (two) times daily., Disp: 60 tablet, Rfl: 11   lamoTRIgine  (LAMICTAL ) 25 MG tablet, 1 twice a day x1wk 2 twice a day x 2nd wk 3 twice a day x3rd wk, Disp: 84 tablet, Rfl: 0   PROLIA  60 MG/ML SOSY injection, Inject 60 mg into the skin every 6 (six) months., Disp: 1 mL, Rfl: 1   rosuvastatin  (CRESTOR ) 20 MG tablet, Take 1 tablet by mouth once daily, Disp: 90 tablet, Rfl: 1   TEGRETOL  200 MG tablet, TAKE 1 & 1/2 (ONE &  ONE-HALF) TABLETS BY MOUTH IN THE MORNING THEN 1 TAB WITH LUNCH THEN 1 & 1/2 (ONE & ONE-HALF) TAB IN THE EVENING, Disp: 360 tablet, Rfl: 1  Allergies  Allergen Reactions   Terbinafine Hcl Rash    Lamasil   Topamax [Topiramate] Rash     ROS: Review of Systems Pertinent items noted in HPI and remainder of comprehensive ROS otherwise negative.    Physical exam BP 124/72   Pulse 70   Temp 97.9 F (36.6 C)   Ht 5' 5 (1.651 m)   Wt 154 lb (69.9 kg)   SpO2 96%   BMI 25.63 kg/m  General appearance: alert, cooperative, appears stated age, no distress, and slowed mentation Head: Normocephalic, without obvious abnormality, atraumatic Eyes: negative findings: lids and lashes normal, conjunctivae and sclerae normal, corneas clear, and pupils equal, round, reactive to light and accomodation Ears: normal TM's and external ear canals both ears Nose: Nares normal. Septum midline. Mucosa normal. No drainage or sinus tenderness. Throat: lips, mucosa, and tongue normal; teeth and gums normal Neck: no adenopathy, supple, symmetrical, trachea midline, and thyroid  not enlarged, symmetric, no tenderness/mass/nodules Back: symmetric, no curvature. ROM normal. No CVA tenderness. Lungs: clear to auscultation bilaterally Heart: regular rate and rhythm, S1, S2 normal, no murmur, click, rub or gallop Abdomen: soft, non-tender; bowel sounds normal; no masses,  no organomegaly Extremities: extremities normal, atraumatic, no cyanosis or edema Pulses: 2+ and symmetric Skin: Skin color, texture, turgor normal. No rashes or lesions Lymph nodes: Cervical, supraclavicular, and axillary nodes normal. Neurologic: Slowed and slightly unsteady gait.  She is reaching to hold onto the railing with walking.  She also has a visible resting tremor in the upper extremities.  No nystagmus appreciated.  She responds to questioning appropriately     03/28/2023    8:31 AM 12/09/2022   11:00 AM 10/03/2022    2:04 PM   Depression screen PHQ 2/9  Decreased Interest 1 0 0  Down, Depressed, Hopeless 1 0 1  PHQ - 2 Score 2 0 1  Altered sleeping 3 0 0  Tired, decreased energy 2 0 1  Change in appetite 1 0 0  Feeling bad or failure about yourself  1 0 0  Trouble concentrating 0 0 1  Moving slowly or fidgety/restless 2 0 0  Suicidal thoughts 0 0 0  PHQ-9 Score 11 0 3  Difficult doing work/chores Somewhat difficult Not difficult at all Somewhat difficult      03/28/2023    8:31 AM 12/09/2022   11:00 AM 10/03/2022    2:05 PM 03/31/2022    2:01 PM  GAD 7 : Generalized Anxiety Score  Nervous, Anxious, on Edge 2 0 1 0  Control/stop worrying 1 0 1 0  Worry too much - different things 1 0 0 0  Trouble relaxing 1 0 1 0  Restless 0 0 0 0  Easily annoyed or irritable 2 0 1 0  Afraid - awful might happen 0 0 0 0  Total GAD 7 Score 7 0 4 0  Anxiety Difficulty Somewhat difficult Not difficult at all Somewhat difficult Not difficult at all     Assessment/ Plan: Savannah Shaw here for annual physical exam.   Annual physical exam  Rhinosinusitis - Plan: cefdinir  (OMNICEF ) 300 MG capsule  Seizure disorder (HCC) - Plan: CMP14+EGFR, CBC  Tremor  Irritable bowel syndrome with diarrhea - Plan: Eluxadoline  (VIBERZI ) 100 MG TABS  Gastroesophageal reflux disease without esophagitis - Plan: esomeprazole  (NEXIUM ) 40 MG capsule  Screen for colon cancer  Mixed hyperlipidemia - Plan: Lipid panel, rosuvastatin  (CRESTOR ) 20 MG tablet  Given progressive nature will empirically treat with oral antibiotics.  I have collected CMP and CBC for seizure disorder.  Sounds like she may not be tolerating higher doses of Lamictal  so I will reach out to neurology as FYI.  Of also recommended that she reach out for follow-up visit.  I have renewed her Viberzi .  National narcotic database reviewed and there were no red flags.  She is up-to-date on UDS and CSC  I have renewed her Nexium  and Crestor .  Fasting lipid panel  collected today.  CBC as above given use of PPI  She will defer colon cancer screening referral and Pap smear till next visit.  Counseled on healthy lifestyle choices, including diet (rich in fruits, vegetables and lean meats and low in salt and simple carbohydrates) and exercise (at least 30 minutes of moderate physical activity daily).  Patient to follow up 9m  Promyse Ardito M. Jolinda, DO

## 2023-10-30 NOTE — Telephone Encounter (Signed)
 Call to patient who reports since being that lamictal  100 mg twice daily dose ( about 2 weeks), she has trembling episodes and is off balance for a few hours after taking morning dose and returns to baseline. She reports hat evening she doesn't experience those symptoms and that she does take the medication with food. She denies any type of rash. Advised I would send to provider for review and reach out with recommendations. Patient aware that if she develops rash to stop medication immediately and inform us .

## 2023-10-30 NOTE — Telephone Encounter (Signed)
 Pt is asking for a call from RN to discuss the  lamoTRIgine (LAMICTAL) 100 MG tablet causing her to be off balance in the morning, causing her trembling episodes in the morning and concerns of trying to walk in the morning, please call pt to discuss.

## 2023-10-30 NOTE — Telephone Encounter (Signed)
 I called patient, she is off zonisamide  now  Currently taking Depakote  DR 500 mg 2 tablets twice a day Lamotrigine  100 mg twice a day Tegretol  200 mg 1.5tablet, 1 tablet, 1.5 tablet at night  Complains of dizziness with polypharmacy,  I have advised her to  Change tegretol  to 200mg  1 at noon, 1.5 at night xone month Tegretol  200mg  1.5 at nightxone month Stop tegretol   Eventually, she will only be on Depakote  DR 500 mg 2 tablets twice a day, lamotrigine  100 mg twice a day  She is to call back clinic for new issues, or side effects from medications

## 2023-12-25 ENCOUNTER — Telehealth: Payer: Self-pay

## 2023-12-25 DIAGNOSIS — M810 Age-related osteoporosis without current pathological fracture: Secondary | ICD-10-CM

## 2023-12-25 MED ORDER — DENOSUMAB 60 MG/ML ~~LOC~~ SOSY
60.0000 mg | PREFILLED_SYRINGE | Freq: Once | SUBCUTANEOUS | Status: AC
Start: 2024-02-08 — End: ?

## 2023-12-25 NOTE — Telephone Encounter (Signed)
 Prolia ordered for PA team to review verification benefits

## 2024-01-17 ENCOUNTER — Telehealth: Payer: Self-pay

## 2024-01-17 NOTE — Telephone Encounter (Signed)
 Prolia VOB initiated via [email or web address]  Next Prolia inj DUE: 02/06/24

## 2024-01-18 ENCOUNTER — Telehealth: Payer: Self-pay | Admitting: Neurology

## 2024-01-18 DIAGNOSIS — G40301 Generalized idiopathic epilepsy and epileptic syndromes, not intractable, with status epilepticus: Secondary | ICD-10-CM

## 2024-01-18 NOTE — Telephone Encounter (Signed)
 Call to patient, reviewed lab work needed patient verbalized understanding. She can't come today but will try and come tomorrow. Hours given for lab.

## 2024-01-18 NOTE — Telephone Encounter (Signed)
  I called patient, she is off zonisamide now   Currently taking Depakote DR 500 mg 2 tablets twice a day Lamotrigine 100 mg twice a day Tegretol 200 mg 1.5tablet, 1 tablet, 1.5 tablet at night   Complains of dizziness with polypharmacy,   I have advised her to  Change tegretol to 200mg  1 at noon, 1.5 at night xone month Tegretol 200mg  1.5 at nightxone month Stop tegretol  Eventually, she will only be on Depakote DR 500 mg 2 tablets twice a day, lamotrigine 100 mg twice a day    Based on phone conversation from October 30, 2023,  She should be off Tegretol now, On lamotrigine 100 mg twice a day, Depakote DR 500 mg 2 tablets twice a day  Above body jerking movement polypharmacy side effect, please let her take current medications, as long as she does not have recurrent seizure  I ordered laboratory evaluations, Orders Placed This Encounter  Procedures   CBC with Differential/Platelet   Comprehensive metabolic panel with GFR   Lamotrigine level   Valproic acid level   If she has frequent body jerking side effect, could not wait the lab results to come back, may consider lower dose of Depakote DR 1/2 tablet daily (instead of 2/2), keep current dose of lamotrigine

## 2024-01-18 NOTE — Telephone Encounter (Signed)
 Pt calling to report that since she has been off of Carbamazepine for 2 weeks she has been experiencing shaking, she is asking for a call to discuss.

## 2024-01-18 NOTE — Telephone Encounter (Signed)
 Call to patient and she reports when coming completely off the tegretol completely she has been having the "shakes" and "jerks" in her legs and arms. She reports not being able to eat  or write because of the tremors. She reports while taking the last taper dose of 200 mg at night she was doing fine with no symptoms. She would like to have Dr. Terrace Arabia thoughts on adding the night dose back in to see if that improves the symptoms.  Advised I will send for review and follow back up.

## 2024-01-19 ENCOUNTER — Other Ambulatory Visit (INDEPENDENT_AMBULATORY_CARE_PROVIDER_SITE_OTHER): Payer: Self-pay

## 2024-01-19 DIAGNOSIS — G40301 Generalized idiopathic epilepsy and epileptic syndromes, not intractable, with status epilepticus: Secondary | ICD-10-CM

## 2024-01-19 DIAGNOSIS — Z0289 Encounter for other administrative examinations: Secondary | ICD-10-CM

## 2024-01-23 LAB — COMPREHENSIVE METABOLIC PANEL WITH GFR
ALT: 8 IU/L (ref 0–32)
AST: 19 IU/L (ref 0–40)
Albumin: 4 g/dL (ref 3.9–4.9)
Alkaline Phosphatase: 41 IU/L — ABNORMAL LOW (ref 44–121)
BUN/Creatinine Ratio: 25 (ref 12–28)
BUN: 16 mg/dL (ref 8–27)
Bilirubin Total: <0.2 mg/dL (ref 0.0–1.2)
CO2: 24 mmol/L (ref 20–29)
Calcium: 9.1 mg/dL (ref 8.7–10.3)
Chloride: 104 mmol/L (ref 96–106)
Creatinine, Ser: 0.63 mg/dL (ref 0.57–1.00)
Globulin, Total: 2 g/dL (ref 1.5–4.5)
Glucose: 95 mg/dL (ref 70–99)
Potassium: 5.4 mmol/L — ABNORMAL HIGH (ref 3.5–5.2)
Sodium: 142 mmol/L (ref 134–144)
Total Protein: 6 g/dL (ref 6.0–8.5)
eGFR: 100 mL/min/{1.73_m2} (ref >59)

## 2024-01-23 LAB — CBC WITH DIFFERENTIAL/PLATELET
Basophils Absolute: 0 10*3/uL (ref 0.0–0.2)
Basos: 0 %
EOS (ABSOLUTE): 0.1 10*3/uL (ref 0.0–0.4)
Eos: 2 %
Hematocrit: 36.3 % (ref 34.0–46.6)
Hemoglobin: 12.1 g/dL (ref 11.1–15.9)
Immature Grans (Abs): 0 10*3/uL (ref 0.0–0.1)
Immature Granulocytes: 1 %
Lymphocytes Absolute: 2.2 10*3/uL (ref 0.7–3.1)
Lymphs: 42 %
MCH: 33.2 pg — ABNORMAL HIGH (ref 26.6–33.0)
MCHC: 33.3 g/dL (ref 31.5–35.7)
MCV: 100 fL — ABNORMAL HIGH (ref 79–97)
Monocytes Absolute: 0.5 10*3/uL (ref 0.1–0.9)
Monocytes: 10 %
Neutrophils Absolute: 2.5 10*3/uL (ref 1.4–7.0)
Neutrophils: 45 %
Platelets: 186 10*3/uL (ref 150–450)
RBC: 3.65 x10E6/uL — ABNORMAL LOW (ref 3.77–5.28)
RDW: 12.3 % (ref 11.7–15.4)
WBC: 5.4 10*3/uL (ref 3.4–10.8)

## 2024-01-23 LAB — VALPROIC ACID LEVEL: Valproic Acid Lvl: 93 ug/mL (ref 50–100)

## 2024-01-23 LAB — LAMOTRIGINE LEVEL: Lamotrigine Lvl: 17.4 ug/mL (ref 2.0–20.0)

## 2024-01-25 ENCOUNTER — Encounter: Payer: Self-pay | Admitting: Neurology

## 2024-01-26 ENCOUNTER — Other Ambulatory Visit (HOSPITAL_COMMUNITY): Payer: Self-pay

## 2024-01-26 NOTE — Telephone Encounter (Signed)
 Pharmacy Patient Advocate Encounter  Insurance verification completed.   The patient is insured through [employer]   Ran test claim for [employer]. Currently a quantity of is a 180 day supply and the co-pay is $4 .   This test claim was processed through [facility]- copay amounts may vary at other pharmacies due to pharmacy/plan contracts, or as the patient moves through the different stages of their insurance plan.

## 2024-01-30 ENCOUNTER — Telehealth: Payer: Self-pay | Admitting: Neurology

## 2024-01-30 ENCOUNTER — Encounter: Payer: Self-pay | Admitting: Neurology

## 2024-01-30 ENCOUNTER — Ambulatory Visit: Admitting: Neurology

## 2024-01-30 VITALS — BP 130/80 | HR 86 | Ht 65.0 in | Wt 156.0 lb

## 2024-01-30 DIAGNOSIS — G40909 Epilepsy, unspecified, not intractable, without status epilepticus: Secondary | ICD-10-CM | POA: Diagnosis not present

## 2024-01-30 DIAGNOSIS — R251 Tremor, unspecified: Secondary | ICD-10-CM | POA: Diagnosis not present

## 2024-01-30 DIAGNOSIS — G40301 Generalized idiopathic epilepsy and epileptic syndromes, not intractable, with status epilepticus: Secondary | ICD-10-CM

## 2024-01-30 MED ORDER — TEGRETOL 200 MG PO TABS
300.0000 mg | ORAL_TABLET | Freq: Every day | ORAL | 1 refills | Status: DC
Start: 2024-01-30 — End: 2024-03-28

## 2024-01-30 NOTE — Telephone Encounter (Signed)
 Per Dr. Gracie Lav, please add patient on to schedule today. Patient agreeable to 1 pm visit. Aware she is worked in

## 2024-01-30 NOTE — Progress Notes (Signed)
 Chief Complaint  Patient presents with   Follow-up    Pt in 15, here alone  Pt is following up on worsening tremors.       ASSESSMENT AND PLAN  Savannah Shaw is a 63 y.o. female   Epilepsy  MRI of the brain with and without contrast in October 2024 showed no significant abnormality  EEG in October 2024 showed mild background slowing,  Suspicious for recurrent seizure at sleep in December 2024, history of kidney stone,  We made medication change since December 2024, she was able to taper off Zonegran, Same dose of Depakote 500 mg 2 tablets twice a day, add on lamotrigine 100 mg twice a day, since end of March, after tapered her off her night dose of Tegretol 300 mg every night, she complains of bilateral hands tremor, denies a previous history of tremor, there was noticeable action component, likely essential tremor, Depakote can actually make her tremor worse   Patient insist on go back to previous dose of nighttime Tegretol 300 mg every night, consider that is the culprit for her current tremor,  Keep June follow-up visit   DIAGNOSTIC DATA (LABS, IMAGING, TESTING) - I reviewed patient records, labs, notes, testing and imaging myself where available.   MEDICAL HISTORY:  Savannah Shaw, is a 63 year old female seen in request by  Ignacia Bayley primary care physician Dr. Nadine Counts, Rozell Searing, for evaluation of seizure, she was brought in by her family member, but alone at today's visit July 03, 2023  I reviewed and summarized the referring note. PMHX. Epilepy, tegretol 200mg  1+1/2 bid, one at noon, Depakote 500mg  2 tab bid, zonisamide 100mg  qhs HLD History of kidney stone  She currently lives with her parents, went on disability many years ago because of seizure, her first seizure started around age 13, generalized clonic tonic seizure, but even before that, she reported frequent morning body jerks,  She was started on antiepileptic medication since then, initially  phenobarbital, Depakote, then replaced phenobarbital with tegretol, because of recurrent seizure, zonisamide was added on, she has been on current combination for many years, Tegretol 300/200/300, Depakote 500 2 tablets twice a day, zonisamide 100 mg every night, she had a history of kidney stone, last generalized seizure she can remember was more than 10 years ago.  She noticed gradual onset bilateral hands tremor, sometimes arm and leg twitching at nighttime  UPDATE Dec 16th 2024: She is accompanied by her father at today's clinical visit, complains of possible seizure about a week ago, she stayed with her girlfriend, in the middle of the night, her girlfriend heard loud noise, but she was difficult to be awakened, she has no recollection of the event, was told she was confused, she did feel very tired the next day,  Father also reported couple times each week, she was noted to have slow reaction time to question,  then slowly came around, was no noticeable clinical seizure activity,   About a aweek ago, she sat around body shaking then she will calm down, confused after that, stat looking around, could not get her throught togather, at least 2-3 times a week, lasgint for few second, next day,  EEG in October 2024 showed mild background slowing, history of kidney stone, on zonisamide 100 mg daily, was on Depakote, and Tegretol,  UPDATE April 15th 2025:  She has been off tegretol at the end of March, she has no recurrent seizure, tolerating Depakote DR 500 mg 2 tablets twice a day,  lamotrigine 100 mg twice a day, level was 93/17.4, within therapeutic range no signs of toxicity  However she complains that since she was off Tegretol 200 mg 1 and half tablets every night, she has developed this bilateral hand shaking, noticeable bilateral action tremor, denies history of tremor in the past, I do not think is related to her being off Tegretol, she wants to go back on previous dosage 300 mg every  night,  I have offered low-dose of Xanax, propranolol, she wants to hold off  PHYSICAL EXAM:   Vitals:   01/30/24 1253  BP: 130/80  Pulse: 86  Weight: 156 lb (70.8 kg)  Height: 5\' 5"  (1.651 m)   Body mass index is 25.96 kg/m.  PHYSICAL EXAMNIATION:  Gen: NAD, conversant, well nourised, well groomed                     Cardiovascular: Regular rate rhythm, no peripheral edema, warm, nontender. Eyes: Conjunctivae clear without exudates or hemorrhage Neck: Supple, no carotid bruits. Pulmonary: Clear to auscultation bilaterally   NEUROLOGICAL EXAM:  MENTAL STATUS: Speech/cognition: Slow reaction time, no dysarthria no aphasia CRANIAL NERVES: CN II: Visual fields are full to confrontation. Pupils are round equal and briskly reactive to light. CN III, IV, VI: extraocular movement are normal. No ptosis. CN V: Facial sensation is intact to light touch CN VII: Face is symmetric with normal eye closure  CN VIII: Hearing is normal to causal conversation. CN IX, X: Phonation is normal. CN XI: Head turning and shoulder shrug are intact  MOTOR: Bilateral hands action tremor, normal strength, no rigidity bradykinesia, moderate difficulty drawing spiral circle  REFLEXES: Reflexes are 2+ and symmetric at the biceps, triceps, knees, and ankles. Plantar responses are flexor.  SENSORY: Intact to light touch, pinprick and vibratory sensation are intact in fingers and toes.  COORDINATION: There is no trunk or limb dysmetria noted.  GAIT/STANCE: Posture is normal. Gait is steady    REVIEW OF SYSTEMS:  Full 14 system review of systems performed and notable only for as above All other review of systems were negative.   ALLERGIES: Allergies  Allergen Reactions   Terbinafine Hcl Rash    Lamasil   Topamax [Topiramate] Rash    HOME MEDICATIONS: Current Outpatient Medications  Medication Sig Dispense Refill   cetirizine (ZYRTEC) 10 MG tablet TAKE 1 TABLET BY MOUTH AT BEDTIME 90  tablet 1   divalproex (DEPAKOTE) 500 MG DR tablet Take 2 tablets (1,000 mg total) by mouth 2 (two) times daily. 360 tablet 3   Eluxadoline (VIBERZI) 100 MG TABS Take 1 tablet (100 mg total) by mouth 2 (two) times daily. 180 tablet 1   esomeprazole (NEXIUM) 40 MG capsule Take 1 capsule (40 mg total) by mouth daily. 90 capsule 3   fluticasone (FLONASE) 50 MCG/ACT nasal spray Use 2 spray(s) in each nostril once daily 16 g 5   lamoTRIgine (LAMICTAL) 100 MG tablet Take 1 tablet (100 mg total) by mouth 2 (two) times daily. 60 tablet 11   lamoTRIgine (LAMICTAL) 25 MG tablet 1 twice a day x1wk 2 twice a day x 2nd wk 3 twice a day x3rd wk 84 tablet 0   PROLIA 60 MG/ML SOSY injection Inject 60 mg into the skin every 6 (six) months. 1 mL 1   rosuvastatin (CRESTOR) 20 MG tablet Take 1 tablet (20 mg total) by mouth daily. 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, controlled [facility]) neurologist-  dr Terrace Arabia ([facility])   last seizure 2008  (dx age 50)   History of acute pyelonephritis    w/ urosepsis 12-31-2015  resolved   History of kidney stones    Hyperlipidemia    Left ureteral stone    Normal exercise tolerance test results    12-24-2015 (dr hochrein)  no chest pain, no signficant arrhythmias , 85% max heartrate achieved    PAST SURGICAL HISTORY: Past Surgical History:  Procedure Laterality Date   APPENDECTOMY     CARDIOVASCULAR STRESS TEST  07-20-2011   normal  nuclear study/  no ischemia, normal LV function and wall motion, ef 76%   CHOLECYSTECTOMY OPEN  1970's   w/  Appendectomy   CYSTOSCOPY W/ URETERAL STENT PLACEMENT Left 12/31/2015   Procedure: CYSTOSCOPY WITH RETROGRADE PYELOGRAM/URETERAL STENT PLACEMENT;  Surgeon: Malen Gauze, MD;  Location: [facility];  Service: Urology;  Laterality: Left;   CYSTOSCOPY W/ URETERAL STENT PLACEMENT Left 01/14/2016   Procedure: CYSTOSCOPY WITH STENT REPLACEMENT;  Surgeon: Malen Gauze, MD;  Location: [facility];  Service: Urology;  Laterality: Left;   CYSTOSCOPY/RETROGRADE/URETEROSCOPY/STONE EXTRACTION WITH BASKET Left 01/14/2016   Procedure: CYSTOSCOPY/RETROGRADE/URETEROSCOPY/STONE EXTRACTION WITH BASKET;  Surgeon: Malen Gauze, MD;  Location: [facility];  Service: Urology;  Laterality: Left;   HOLMIUM LASER APPLICATION Left 01/14/2016   Procedure: HOLMIUM LASER APPLICATION;  Surgeon: Malen Gauze, MD;  Location: [facility];  Service: Urology;  Laterality: Left;   OOPHORECTOMY  1980's   Unilateral oophorectomy and  Bilateral Salpingectomy (tubal)   TONSILLECTOMY AND ADENOIDECTOMY  as child   TRANSTHORACIC ECHOCARDIOGRAM  07-20-2011   ef 55-65%/  trivial MR/  mild TR    FAMILY HISTORY: Family History  Problem Relation Age of Onset   Diabetes Mother    Heart disease Mother 49       No details about the type of heart disease.     Breast cancer Paternal Aunt    Prostate cancer Other    Colon cancer Neg Hx     SOCIAL HISTORY: Social History   Socioeconomic History   Marital status: Single    Spouse name: Not on file   Number of children: Not on file   Years of education: Not on file   Highest education level: Not on file  Occupational History   Occupation: Disabled    Comment: Seizures  Tobacco Use   Smoking status: Some Days    Current packs/day: 0.00    Average packs/day: 1 pack/day for 5.0 years (5.0 ttl pk-yrs)    Types:  Cigarettes    Start date: 01/10/2006    Last attempt to quit: 01/11/2011    Years since quitting: 13.0   Smokeless tobacco: Never  Vaping Use   Vaping status: Former  Substance and Sexual Activity   Alcohol use: No   Drug use: No   Sexual activity: Not on file  Other Topics Concern   Not on file  Social History Narrative   Daily Caffeine use - 3   Social Drivers of Health   Financial Resource Strain: Medium Risk (03/24/2023)   Overall Financial Resource Strain (CARDIA)    Difficulty of Paying Living Expenses: Somewhat hard  Food Insecurity: No Food Insecurity (03/24/2023)   Hunger Vital Sign    Worried About Running Out of Food in the Last Year: Never true    Ran Out of Food in the Last Year: Never true  Transportation Needs: No Transportation Needs (03/24/2023)   PRAPARE - Administrator, Civil Service (Medical): No    Lack of Transportation (Non-Medical): No  Physical Activity: Inactive (03/24/2023)   Exercise Vital Sign    Days of Exercise per Week: 1 day    Minutes of Exercise per Session: 0 min  Stress: Stress Concern Present (03/24/2023)   [employer] of Occupational Health - Occupational Stress Questionnaire    Feeling of Stress : To some extent  Social Connections: Unknown (03/24/2023)   Social Connection and Isolation Panel [NHANES]    Frequency of Communication with Friends and Family: More than three times a week    Frequency of Social Gatherings with Friends and Family: Twice a week    Attends Religious Services: More than 4 times per year    Active Member of [employer] or Organizations: Not on file    Attends Banker Meetings: Not on file    Marital Status: Divorced  Intimate Partner Violence: Not on file      Phebe Brasil, M.D. Ph.D.  [facility] [address] [place], [place] [address] Ph: [phone] Fax: [phone]  CC:  Eliodoro Guerin, DO [address]  [place] [address]  Eliodoro Guerin,  DO

## 2024-01-30 NOTE — Telephone Encounter (Signed)
 Pt called stating that ever since she was taken off of the TEGRETOL 200 MG tablet she has been shaking really bad and it is not allowing her to do anything because she shakes so bad, Pt would like to be advised on what can be done.

## 2024-02-06 ENCOUNTER — Other Ambulatory Visit: Payer: Self-pay

## 2024-02-06 ENCOUNTER — Other Ambulatory Visit (HOSPITAL_COMMUNITY): Payer: Self-pay

## 2024-02-06 DIAGNOSIS — M810 Age-related osteoporosis without current pathological fracture: Secondary | ICD-10-CM

## 2024-02-06 MED ORDER — DENOSUMAB 60 MG/ML ~~LOC~~ SOSY
60.0000 mg | PREFILLED_SYRINGE | Freq: Once | SUBCUTANEOUS | 0 refills | Status: AC
Start: 2024-02-06 — End: 2024-02-13
  Filled 2024-02-06 (×2): qty 1, 1d supply, fill #0
  Filled 2024-02-07 – 2024-02-08 (×2): qty 1, 180d supply, fill #0

## 2024-02-07 ENCOUNTER — Other Ambulatory Visit: Payer: Self-pay

## 2024-02-07 NOTE — Progress Notes (Signed)
 Pharmacy Patient Advocate Encounter  Insurance verification completed.   The patient is insured through [facility]   Ran test claim for Prolia . Co-pay is $4.  This test claim was processed through [facility]- copay amounts may vary at other pharmacies due to pharmacy/plan contracts, or as the patient moves through the different stages of their insurance plan.

## 2024-02-08 ENCOUNTER — Other Ambulatory Visit: Payer: Self-pay

## 2024-02-08 ENCOUNTER — Other Ambulatory Visit (HOSPITAL_COMMUNITY): Payer: Self-pay

## 2024-02-08 NOTE — Progress Notes (Signed)
 Specialty Pharmacy Initial Fill Coordination Note  Savannah Shaw is a 63 y.o. female contacted today regarding initial fill of specialty medication(s) Denosumab  (PROLIA )   Patient requested Courier to Provider Office   Delivery date: 02/13/24   Verified address: [facility]   Medication will be filled on 4/28.   Patient is aware of $4 copayment.

## 2024-02-12 ENCOUNTER — Other Ambulatory Visit: Payer: Self-pay

## 2024-02-12 ENCOUNTER — Other Ambulatory Visit (HOSPITAL_COMMUNITY): Payer: Self-pay

## 2024-03-13 NOTE — Progress Notes (Signed)
 This encounter was created in error - please disregard.

## 2024-03-28 NOTE — Progress Notes (Signed)
 Subjective: CC: IBS-D PCP: Eliodoro Guerin, DO [id number] Savannah Shaw is a 63 y.o. female presenting to clinic today for:  1. IBS-D Compliant with Viberzi .  Missed a dose Saturday and had a bad flare up of IBS-D.  Better now back on meds.  No blood in stool.  Would like to do Cologuard for colon cancer screening this go around.  Nobody with family history of colon cancer  2.  Seizure disorder She has started developing a really bad tremor related to her seizure medicines.  She was on Tegretol  but was weaned off of that and is now on Depakote  and Lamictal .  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, controlled [facility]) neurologist-  dr Gracie Lav ([facility])   last seizure 2008  (dx age 7)   History of acute pyelonephritis    w/ urosepsis 12-31-2015  resolved   History of kidney stones    Hyperlipidemia    Left ureteral stone    Normal exercise tolerance test results    12-24-2015 (dr hochrein)  no chest pain, no signficant arrhythmias , 85% max heartrate achieved    Current Outpatient Medications:    cetirizine  (ZYRTEC ) 10 MG tablet, TAKE 1 TABLET BY MOUTH AT BEDTIME, Disp: 90 tablet, Rfl: 1   divalproex  (DEPAKOTE ) 500 MG DR tablet, Take 2 tablets (1,000 mg total) by mouth 2 (two) times daily., Disp: 360 tablet, Rfl: 3   Eluxadoline  (VIBERZI ) 100 MG TABS, Take 1 tablet (100 mg total) by mouth 2 (two) times daily., Disp: 180 tablet, Rfl: 1   esomeprazole  (NEXIUM ) 40 MG capsule, Take 1 capsule (40 mg total) by mouth daily., Disp: 90 capsule, Rfl: 3   fluticasone  (FLONASE ) 50 MCG/ACT nasal spray, Use 2 spray(s) in each nostril once daily, Disp: 16 g, Rfl: 5   lamoTRIgine  (LAMICTAL ) 100 MG tablet, Take 1 tablet (100 mg total) by mouth 2 (two) times daily.,  Disp: 60 tablet, Rfl: 11   lamoTRIgine  (LAMICTAL ) 25 MG tablet, 1 twice a day x1wk 2 twice a day x 2nd wk 3 twice a day x3rd wk, Disp: 84 tablet, Rfl: 0   rosuvastatin  (CRESTOR ) 20 MG tablet, Take 1 tablet (20 mg total) by mouth daily., Disp: 90 tablet, Rfl: 3  Current Facility-Administered Medications:    denosumab  (PROLIA ) injection 60 mg, 60 mg, Subcutaneous, Once, Freddie Dymek M, DO Social History   Socioeconomic History   Marital status: Single    Spouse name: Not on file   Number of children: Not on file   Years of education: Not on file   Highest education level: GED or equivalent  Occupational History   Occupation: Disabled    Comment: Seizures  Tobacco Use   Smoking status: Some Days    Current packs/day: 0.00    Average packs/day: 1 pack/day for 5.0 years (5.0 ttl pk-yrs)    Types: Cigarettes    Start date: 01/10/2006    Last attempt to quit: 01/11/2011    Years since quitting: 13.2   Smokeless tobacco: Never  Vaping Use   Vaping status: Former  Substance and Sexual Activity   Alcohol use: No   Drug use: No   Sexual activity: Not on file  Other Topics Concern   Not  on file  Social History Narrative   Daily Caffeine use - 3   Social Drivers of Health   Financial Resource Strain: Medium Risk (03/25/2024)   Overall Financial Resource Strain (CARDIA)    Difficulty of Paying Living Expenses: Somewhat hard  Food Insecurity: Food Insecurity Present (03/25/2024)   Hunger Vital Sign    Worried About Running Out of Food in the Last Year: Sometimes true    Ran Out of Food in the Last Year: Never true  Transportation Needs: No Transportation Needs (03/25/2024)   PRAPARE - Administrator, Civil Service (Medical): No    Lack of Transportation (Non-Medical): No  Physical Activity: Insufficiently Active (03/25/2024)   Exercise Vital Sign    Days of Exercise per Week: 2 days    Minutes of Exercise per Session: 30 min  Stress: Stress Concern Present (03/25/2024)    [employer] of Occupational Health - Occupational Stress Questionnaire    Feeling of Stress : To some extent  Social Connections: Moderately Isolated (03/25/2024)   Social Connection and Isolation Panel    Frequency of Communication with Friends and Family: More than three times a week    Frequency of Social Gatherings with Friends and Family: Once a week    Attends Religious Services: More than 4 times per year    Active Member of [employer] or Organizations: No    Attends Engineer, structural: Not on file    Marital Status: Divorced  Catering manager Violence: Not on file   Family History  Problem Relation Age of Onset   Diabetes Mother    Heart disease Mother 30       No details about the type of heart disease.     Breast cancer Paternal Aunt    Prostate cancer Other    Colon cancer Neg Hx     Objective: Office vital signs reviewed. BP 124/63   Pulse 76   Temp 97.9 F (36.6 C)   Ht 5' 5 (1.651 m)   Wt 155 lb (70.3 kg)   SpO2 98%   BMI 25.79 kg/m   Physical Examination:  General: Awake, alert, well nourished, No acute distress HEENT: sclera white, MMM Cardio: regular rate and rhythm, S1S2 heard, no murmurs appreciated Pulm: clear to auscultation bilaterally, no wheezes, rhonchi or rales; normal work of breathing on room air Neuro: tremor present, somewhat delayed cognitive response when talking.  Assessment/ Plan: 63 y.o. female   Seizure disorder (HCC) - Plan: CMP14+EGFR, CBC  Tremor  Irritable bowel syndrome with diarrhea - Plan: ToxASSURE Select 13 (MW), Urine, Eluxadoline  (VIBERZI ) 100 MG TABS  Controlled substance agreement signed - Plan: ToxASSURE Select 13 (MW), Urine  Screen for colon cancer - Plan: Cologuard  I will CC her neurologist as FYI.  Check CMP, CBC.  Tremulous on exam.  Somewhat delayed cognition with conversation.  IBS-D controlled with med.  Check UDS/ CSC. The Narcotic Database has been reviewed.  There were no red flags.     Cologuard for colon cancer screening    Mclane Arora Bambi Bonine, DO [facility] [phone]

## 2024-03-29 ENCOUNTER — Encounter: Payer: Self-pay | Admitting: Family Medicine

## 2024-03-29 ENCOUNTER — Ambulatory Visit: Payer: Medicaid Other | Admitting: Family Medicine

## 2024-03-29 VITALS — BP 124/63 | HR 76 | Temp 97.9°F | Ht 65.0 in | Wt 155.0 lb

## 2024-03-29 DIAGNOSIS — Z124 Encounter for screening for malignant neoplasm of cervix: Secondary | ICD-10-CM

## 2024-03-29 DIAGNOSIS — Z79899 Other long term (current) drug therapy: Secondary | ICD-10-CM

## 2024-03-29 DIAGNOSIS — R251 Tremor, unspecified: Secondary | ICD-10-CM | POA: Diagnosis not present

## 2024-03-29 DIAGNOSIS — R87810 Cervical high risk human papillomavirus (HPV) DNA test positive: Secondary | ICD-10-CM

## 2024-03-29 DIAGNOSIS — G40909 Epilepsy, unspecified, not intractable, without status epilepticus: Secondary | ICD-10-CM | POA: Diagnosis not present

## 2024-03-29 DIAGNOSIS — K580 Irritable bowel syndrome with diarrhea: Secondary | ICD-10-CM

## 2024-03-29 DIAGNOSIS — Z1211 Encounter for screening for malignant neoplasm of colon: Secondary | ICD-10-CM

## 2024-03-29 MED ORDER — VIBERZI 100 MG PO TABS: 1.0000 | ORAL_TABLET | Freq: Two times a day (BID) | ORAL | 1 refills | Status: DC

## 2024-03-30 LAB — CMP14+EGFR
ALT: 9 IU/L (ref 0–32)
AST: 23 IU/L (ref 0–40)
Albumin: 4.3 g/dL (ref 3.9–4.9)
Alkaline Phosphatase: 56 IU/L (ref 44–121)
BUN/Creatinine Ratio: 20 (ref 12–28)
BUN: 14 mg/dL (ref 8–27)
Bilirubin Total: <0.2 mg/dL (ref 0.0–1.2)
CO2: 23 mmol/L (ref 20–29)
Calcium: 9.2 mg/dL (ref 8.7–10.3)
Chloride: 102 mmol/L (ref 96–106)
Creatinine, Ser: 0.69 mg/dL (ref 0.57–1.00)
Globulin, Total: 2.3 g/dL (ref 1.5–4.5)
Glucose: 88 mg/dL (ref 70–99)
Potassium: 4.9 mmol/L (ref 3.5–5.2)
Sodium: 141 mmol/L (ref 134–144)
Total Protein: 6.6 g/dL (ref 6.0–8.5)
eGFR: 97 mL/min/{1.73_m2} (ref >59)

## 2024-03-30 LAB — CBC
Hematocrit: 39.8 % (ref 34.0–46.6)
Hemoglobin: 13.1 g/dL (ref 11.1–15.9)
MCH: 33.9 pg — ABNORMAL HIGH (ref 26.6–33.0)
MCHC: 32.9 g/dL (ref 31.5–35.7)
MCV: 103 fL — ABNORMAL HIGH (ref 79–97)
Platelets: 182 10*3/uL (ref 150–450)
RBC: 3.87 x10E6/uL (ref 3.77–5.28)
RDW: 13.5 % (ref 11.7–15.4)
WBC: 5.3 10*3/uL (ref 3.4–10.8)

## 2024-04-01 ENCOUNTER — Ambulatory Visit: Payer: Self-pay | Admitting: Family Medicine

## 2024-04-01 ENCOUNTER — Encounter: Payer: Self-pay | Admitting: Neurology

## 2024-04-01 ENCOUNTER — Ambulatory Visit: Payer: Medicaid Other | Admitting: Neurology

## 2024-04-01 ENCOUNTER — Other Ambulatory Visit: Payer: Self-pay | Admitting: Nurse Practitioner

## 2024-04-01 VITALS — BP 137/79 | HR 87 | Resp 16 | Ht 65.0 in | Wt 155.0 lb

## 2024-04-01 DIAGNOSIS — R251 Tremor, unspecified: Secondary | ICD-10-CM

## 2024-04-01 DIAGNOSIS — G40301 Generalized idiopathic epilepsy and epileptic syndromes, not intractable, with status epilepticus: Secondary | ICD-10-CM | POA: Diagnosis not present

## 2024-04-01 DIAGNOSIS — J301 Allergic rhinitis due to pollen: Secondary | ICD-10-CM

## 2024-04-01 MED ORDER — LAMOTRIGINE 100 MG PO TABS: 200.0000 mg | ORAL_TABLET | Freq: Two times a day (BID) | ORAL | 11 refills | Status: DC

## 2024-04-01 NOTE — Progress Notes (Signed)
 Chief Complaint  Patient presents with   Seizures    Rm14, friend present, ZO:XWRU sz 6 months ago, doing better   Tremors    Rm14, friend present, Tremor: whole body, progressively worsening       ASSESSMENT AND PLAN  Savannah Shaw is a 63 y.o. female   Epilepsy  MRI of the brain with and without contrast in October 2024 showed no significant abnormality  EEG in October 2024 showed mild background slowing,  Suspicious for recurrent seizure at sleep in December 2024, history of kidney stone,  We made medication change since December 2024, she was able to taper off Zonegran , stayed on the same dose of Depakote  500 mg 2 tablets twice a day, add on lamotrigine  100 mg twice a day, since end of March,2025 after tapered her off her night dose of Tegretol  300 mg every night, she complains of worsening bilateral hands tremor, history of milder intermittent tremor in the past, there was noticeable action component, likely essential tremor, Depakote  can worsen an action tremor, level was 93 in April 2025, higher than her baseline of 70,  After discussed with patient and her cousin, decide to increase Lamotrigine  to 100mg  2 tab bid, taper down Depakote  to 500mg  bid.      DIAGNOSTIC DATA (LABS, IMAGING, TESTING) - I reviewed patient records, labs, notes, testing and imaging myself where available.   MEDICAL HISTORY:  Savannah Shaw, is a 63 year old female seen in request by  [facility]at[facility] Makos primary care physician Dr. Bonnell Butcher, Gwendalyn Lemma, for evaluation of seizure, she was brought in by her family member, but alone at today's visit July 03, 2023  I reviewed and summarized the referring note. PMHX. Epilepy, tegretol  200mg  1+1/2 bid, one at noon, Depakote  500mg  2 tab bid, zonisamide  100mg  qhs HLD History of kidney stone  She currently lives with her parents, went on disability many years ago because of seizure, her first seizure started around age 28, generalized clonic tonic seizure,  but even before that, she reported frequent morning body jerks,  She was started on antiepileptic medication since then, initially phenobarbital, Depakote , then replaced phenobarbital with tegretol , because of recurrent seizure, zonisamide  was added on, she has been on current combination for many years, Tegretol  300/200/300, Depakote  500 2 tablets twice a day, zonisamide  100 mg every night, she had a history of kidney stone, last generalized seizure she can remember was more than 10 years ago.  She noticed gradual onset bilateral hands tremor, sometimes arm and leg twitching at nighttime  UPDATE Dec 16th 2024: She is accompanied by her father at today's clinical visit, complains of possible seizure about a week ago, she stayed with her girlfriend, in the middle of the night, her girlfriend heard loud noise, but she was difficult to be awakened, she has no recollection of the event, was told she was confused, she did feel very tired the next day,  Father also reported couple times each week, she was noted to have slow reaction time to question,  then slowly came around, was no noticeable clinical seizure activity,   About a aweek ago, she sat around body shaking then she will calm down, confused after that, stat looking around, could not get her throught togather, at least 2-3 times a week, lasgint for few second, next day,  EEG in October 2024 showed mild background slowing, history of kidney stone, on zonisamide  100 mg daily, was on Depakote , and Tegretol ,  UPDATE April 15th 2025: She has been  off tegretol  at the end of March, she has no recurrent seizure, tolerating Depakote  DR 500 mg 2 tablets twice a day, lamotrigine  100 mg twice a day, level was 93/17.4, within therapeutic range no signs of toxicity  However she complains that since she was off Tegretol  200 mg 1 and half tablets every night, she has developed this bilateral hand shaking, noticeable bilateral action tremor, denies history of  tremor in the past, I do not think is related to her being off Tegretol , she wants to go back on previous dosage 300 mg every night,  I have offered low-dose of Xanax, propranolol, she wants to hold off  UPDATE June 16th 2025: She is with her cousin at today's visit, no recurrent seizure taking Depakote  DR 500 mg 2 tablets twice a day lamotrigine  100 mg twice a day, able to taper off Tegretol , she continued concern about constant bilateral hands tremor, also made worse by anxiety, in front of people,  Laboratory evaluation April 2025 Depakote  level 93, lamotrigine  level 17.4,  She previously contributing her tremor to stopping Tegretol , self medicate Tegretol  could not tolerate, complains of dizziness, no help with her tremor,  PHYSICAL EXAM:   Vitals:   04/01/24 1145  BP: 137/79  Pulse: 87  Resp: 16  Weight: 154 lb 15.7 oz (70.3 kg)  Height: 5' 5 (1.651 m)   Body mass index is 25.79 kg/m.  PHYSICAL EXAMNIATION:  Gen: NAD, conversant, well nourised, well groomed                     Cardiovascular: Regular rate rhythm, no peripheral edema, warm, nontender. Eyes: Conjunctivae clear without exudates or hemorrhage Neck: Supple, no carotid bruits. Pulmonary: Clear to auscultation bilaterally   NEUROLOGICAL EXAM:  MENTAL STATUS: Speech/cognition: Slow reaction time, anxious looking middle-age female, no dysarthria no aphasia CRANIAL NERVES: CN II: Visual fields are full to confrontation. Pupils are round equal and briskly reactive to light. CN III, IV, VI: extraocular movement are normal. No ptosis. CN V: Facial sensation is intact to light touch CN VII: Face is symmetric with normal eye closure  CN VIII: Hearing is normal to causal conversation. CN IX, X: Phonation is normal. CN XI: Head turning and shoulder shrug are intact  MOTOR: Bilateral hands action tremor, normal strength, no rigidity bradykinesia, moderate difficulty drawing spiral circle  REFLEXES: Reflexes are  2+ and symmetric at the biceps, triceps, knees, and ankles. Plantar responses are flexor.  SENSORY: Intact to light touch, pinprick and vibratory sensation are intact in fingers and toes.  COORDINATION: There is no trunk or limb dysmetria noted.  GAIT/STANCE: Posture is normal. Gait is steady    REVIEW OF SYSTEMS:  Full 14 system review of systems performed and notable only for as above All other review of systems were negative.   ALLERGIES: Allergies  Allergen Reactions   Terbinafine Hcl Rash    Lamasil   Topamax [Topiramate] Rash    HOME MEDICATIONS: Current Outpatient Medications  Medication Sig Dispense Refill   cetirizine  (ZYRTEC ) 10 MG tablet TAKE 1 TABLET BY MOUTH AT BEDTIME 90 tablet 1   divalproex  (DEPAKOTE ) 500 MG DR tablet Take 2 tablets (1,000 mg total) by mouth 2 (two) times daily. 360 tablet 3   Eluxadoline  (VIBERZI ) 100 MG TABS Take 1 tablet (100 mg total) by mouth 2 (two) times daily. 180 tablet 1   esomeprazole  (NEXIUM ) 40 MG capsule Take 1 capsule (40 mg total) by mouth daily. 90 capsule 3  fluticasone  (FLONASE ) 50 MCG/ACT nasal spray Use 2 spray(s) in each nostril once daily 16 g 5   lamoTRIgine  (LAMICTAL ) 100 MG tablet Take 1 tablet (100 mg total) by mouth 2 (two) times daily. 60 tablet 11   rosuvastatin  (CRESTOR ) 20 MG tablet Take 1 tablet (20 mg total) by mouth daily. 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, controlled [facility] neurologist-  dr Gracie Lav ([facility])   last seizure 2008  (dx age 52)   History of acute pyelonephritis    w/ urosepsis 12-31-2015  resolved   History of kidney stones    Hyperlipidemia    Left ureteral stone    Normal exercise  tolerance test results    12-24-2015 (dr hochrein)  no chest pain, no signficant arrhythmias , 85% max heartrate achieved    PAST SURGICAL HISTORY: Past Surgical History:  Procedure Laterality Date   APPENDECTOMY     CARDIOVASCULAR STRESS TEST  07-20-2011   normal nuclear study/  no ischemia, normal LV function and wall motion, ef 76%   CHOLECYSTECTOMY OPEN  1970's   w/  Appendectomy   CYSTOSCOPY W/ URETERAL STENT PLACEMENT Left 12/31/2015   Procedure: CYSTOSCOPY WITH RETROGRADE PYELOGRAM/URETERAL STENT PLACEMENT;  Surgeon: Marco Severs, MD;  Location: [facility];  Service: Urology;  Laterality: Left;   CYSTOSCOPY W/ URETERAL STENT PLACEMENT Left 01/14/2016   Procedure: CYSTOSCOPY WITH STENT REPLACEMENT;  Surgeon: Marco Severs, MD;  Location[facility];  Service: Urology;  Laterality: Left;   CYSTOSCOPY/RETROGRADE/URETEROSCOPY/STONE EXTRACTION WITH BASKET Left 01/14/2016   Procedure: CYSTOSCOPY/RETROGRADE/URETEROSCOPY/STONE EXTRACTION WITH BASKET;  Surgeon: Marco Severs, MD;  Location: [facility];  Service: Urology;  Laterality: Left;   HOLMIUM LASER APPLICATION Left 01/14/2016   Procedure: HOLMIUM LASER APPLICATION;  Surgeon: Marco Severs, MD;  Location: [facility];  Service: Urology;  Laterality: Left;   OOPHORECTOMY  1980's   Unilateral oophorectomy and  Bilateral Salpingectomy (tubal)   TONSILLECTOMY AND ADENOIDECTOMY  as child   TRANSTHORACIC ECHOCARDIOGRAM  07-20-2011   ef 55-65%/  trivial MR/  mild TR    FAMILY HISTORY: Family History  Problem Relation Age of Onset   Diabetes Mother    Heart disease Mother 62       No details about the type of heart disease.     Breast cancer Paternal Aunt    Prostate cancer Other    Colon cancer Neg Hx     SOCIAL HISTORY: Social History   Socioeconomic History   Marital status: Single    Spouse name: Not on file   Number of children: Not on file   Years of education:  Not on file   Highest education level: GED or equivalent  Occupational History   Occupation: Disabled    Comment: Seizures  Tobacco Use   Smoking status: Some Days    Current packs/day: 0.00    Average packs/day: 1 pack/day for 5.0 years (5.0 ttl pk-yrs)    Types: Cigarettes    Start date: 01/10/2006    Last attempt to quit: 01/11/2011    Years since quitting: 13.2   Smokeless tobacco: Never  Vaping Use   Vaping status:  Former  Substance and Sexual Activity   Alcohol use: No   Drug use: No   Sexual activity: Not on file  Other Topics Concern   Not on file  Social History Narrative   Daily Caffeine use - 3   Social Drivers of Health   Financial Resource Strain: Medium Risk (03/25/2024)   Overall Financial Resource Strain (CARDIA)    Difficulty of Paying Living Expenses: Somewhat hard  Food Insecurity: Food Insecurity Present (03/25/2024)   Hunger Vital Sign    Worried About Running Out of Food in the Last Year: Sometimes true    Ran Out of Food in the Last Year: Never true  Transportation Needs: No Transportation Needs (03/25/2024)   PRAPARE - Administrator, Civil Service (Medical): No    Lack of Transportation (Non-Medical): No  Physical Activity: Insufficiently Active (03/25/2024)   Exercise Vital Sign    Days of Exercise per Week: 2 days    Minutes of Exercise per Session: 30 min  Stress: Stress Concern Present (03/25/2024)   [employer] of Occupational Health - Occupational Stress Questionnaire    Feeling of Stress : To some extent  Social Connections: Moderately Isolated (03/25/2024)   Social Connection and Isolation Panel    Frequency of Communication with Friends and Family: More than three times a week    Frequency of Social Gatherings with Friends and Family: Once a week    Attends Religious Services: More than 4 times per year    Active Member of [employer] or Organizations: No    Attends Banker Meetings: Not on file    Marital Status:  Divorced  Intimate Partner Violence: Not on file      Phebe Brasil, M.D. Ph.D.  [facility] [address] [place], [place] [address] Ph: [phone] Fax: [phone]  CC:  Eliodoro Guerin, DO [address] [place],  [place] [address]  Eliodoro Guerin, DO    The patient's condition necessitates ongoing care to continuously monitor and adjust the treatment plan, reflecting the complexity and evolving nature of his medical needs. I/our office will serve as the central point of coordination for all necessary services related to this condition.

## 2024-04-03 ENCOUNTER — Encounter (HOSPITAL_COMMUNITY): Payer: Self-pay

## 2024-04-03 ENCOUNTER — Emergency Department (HOSPITAL_COMMUNITY)
Admission: EM | Admit: 2024-04-03 | Discharge: 2024-04-03 | Disposition: A | Discharge status: Home / Self Care | Attending: Emergency Medicine | Admitting: Emergency Medicine

## 2024-04-03 ENCOUNTER — Other Ambulatory Visit: Payer: Self-pay

## 2024-04-03 DIAGNOSIS — R197 Diarrhea, unspecified: Secondary | ICD-10-CM | POA: Diagnosis not present

## 2024-04-03 DIAGNOSIS — R112 Nausea with vomiting, unspecified: Secondary | ICD-10-CM | POA: Insufficient documentation

## 2024-04-03 DIAGNOSIS — R1033 Periumbilical pain: Secondary | ICD-10-CM | POA: Diagnosis not present

## 2024-04-03 HISTORY — DX: Epilepsy, unspecified, not intractable, without status epilepticus: G40.909

## 2024-04-03 LAB — LIPASE, BLOOD: Lipase: 38 U/L (ref 11–51)

## 2024-04-03 LAB — COMPREHENSIVE METABOLIC PANEL WITH GFR
ALT: 11 U/L (ref 0–44)
AST: 21 U/L (ref 15–41)
Albumin: 3.5 g/dL (ref 3.5–5.0)
Alkaline Phosphatase: 42 U/L (ref 38–126)
Anion gap: 10 (ref 5–15)
BUN: 20 mg/dL (ref 8–23)
CO2: 23 mmol/L (ref 22–32)
Calcium: 9.1 mg/dL (ref 8.9–10.3)
Chloride: 106 mmol/L (ref 98–111)
Creatinine, Ser: 0.69 mg/dL (ref 0.44–1.00)
GFR, Estimated: >60 mL/min (ref >60)
Glucose, Bld: 113 mg/dL — ABNORMAL HIGH (ref 70–99)
Potassium: 4.2 mmol/L (ref 3.5–5.1)
Sodium: 139 mmol/L (ref 135–145)
Total Bilirubin: 0.4 mg/dL (ref 0.0–1.2)
Total Protein: 6.6 g/dL (ref 6.5–8.1)

## 2024-04-03 LAB — URINALYSIS, ROUTINE W REFLEX MICROSCOPIC
Bilirubin Urine: NEGATIVE
Glucose, UA: NEGATIVE mg/dL
Ketones, ur: 5 mg/dL — AB
Nitrite: NEGATIVE
Protein, ur: NEGATIVE mg/dL
Specific Gravity, Urine: 1.008 (ref 1.005–1.030)
pH: 6 (ref 5.0–8.0)

## 2024-04-03 LAB — CBC WITH DIFFERENTIAL/PLATELET
Abs Immature Granulocytes: 0.01 10*3/uL (ref 0.00–0.07)
Basophils Absolute: 0 10*3/uL (ref 0.0–0.1)
Basophils Relative: 1 %
Eosinophils Absolute: 0 10*3/uL (ref 0.0–0.5)
Eosinophils Relative: 1 %
HCT: 37.8 % (ref 36.0–46.0)
Hemoglobin: 12.8 g/dL (ref 12.0–15.0)
Immature Granulocytes: 0 %
Lymphocytes Relative: 34 %
Lymphs Abs: 1.5 10*3/uL (ref 0.7–4.0)
MCH: 34.5 pg — ABNORMAL HIGH (ref 26.0–34.0)
MCHC: 33.9 g/dL (ref 30.0–36.0)
MCV: 101.9 fL — ABNORMAL HIGH (ref 80.0–100.0)
Monocytes Absolute: 0.4 10*3/uL (ref 0.1–1.0)
Monocytes Relative: 9 %
Neutro Abs: 2.4 10*3/uL (ref 1.7–7.7)
Neutrophils Relative %: 55 %
Platelets: 196 10*3/uL (ref 150–400)
RBC: 3.71 MIL/uL — ABNORMAL LOW (ref 3.87–5.11)
RDW: 13.6 % (ref 11.5–15.5)
WBC: 4.3 10*3/uL (ref 4.0–10.5)
nRBC: 0 % (ref 0.0–0.2)

## 2024-04-03 LAB — VALPROIC ACID LEVEL: Valproic Acid Lvl: 75 ug/mL (ref 50–100)

## 2024-04-03 LAB — TOXASSURE SELECT 13 (MW), URINE

## 2024-04-03 MED ORDER — SODIUM CHLORIDE 0.9 % IV BOLUS
1000.0000 mL | Freq: Once | INTRAVENOUS | Status: AC
  Administered 2024-04-03: 1000 mL via INTRAVENOUS

## 2024-04-03 MED ORDER — ONDANSETRON HCL 4 MG PO TABS
4.0000 mg | ORAL_TABLET | Freq: Three times a day (TID) | ORAL | 0 refills | Status: DC | PRN
Start: 2024-04-03 — End: 2024-05-17

## 2024-04-03 MED ORDER — ONDANSETRON HCL 4 MG/2ML IJ SOLN
4.0000 mg | Freq: Once | INTRAMUSCULAR | Status: AC
  Administered 2024-04-03: 4 mg via INTRAVENOUS
  Filled 2024-04-03: qty 2

## 2024-04-03 NOTE — ED Triage Notes (Signed)
 Pt arrived via POV c/o shakiness, nausea, emesis and feeling weak. Pt reports symptoms began after recent doctors appointment when her doctor adjusted her medications.

## 2024-04-03 NOTE — ED Provider Notes (Cosign Needed)
 [place] EMERGENCY DEPARTMENT AT [facility] Provider Note   CSN: [id number] Arrival date & time: 04/03/24  4098     Patient presents with: Emesis   Savannah Shaw is a 63 y.o. female with history of seizure disorder,  suspected essential tremor under the care of Dr. Gracie Lav, also hx of GERD, hyperlipidemia, presenting with nausea and vomiting which she woke with today. She saw Dr. Gracie Lav 2 days ago who decreased her depakote  and increased the lamictal  in attempt to help her with the tremor.  Pt suspects this change of medicines are contributing to the n/v.  She denies fevers, chills, sob, cp, she does endorse upper abd cramping, improved with emesis, 2 episodes of emesis with 1 episode of diarrhea - both nonbloody.  No sick exposures,  no recent abx or concern for improperly cooked foods.   She describes feeling very unsteady on her feet with her tremor which has been worsening in recent months.  She lives alone and sometimes has to sit for hours in one spot waiting for the tremors to improve enough where she feels safe walking.  We discussed options including referral for home health or consideration for rehab placement - pt adamantly refuses either option.  She does not have a cane/walker or other assistive devices.  When offered, she declined.   The history is provided by the patient.       Prior to Admission medications   Medication Sig Start Date End Date Taking? Authorizing Provider  ondansetron  (ZOFRAN ) 4 MG tablet Take 1 tablet (4 mg total) by mouth every 8 (eight) hours as needed for nausea or vomiting. 04/03/24  Yes Simra Fiebig, PA-C  cetirizine  (ZYRTEC ) 10 MG tablet TAKE 1 TABLET BY MOUTH AT BEDTIME 04/01/24   Gaylyn Keas, Mary-Margaret, FNP  divalproex  (DEPAKOTE ) 500 MG DR tablet Take 2 tablets (1,000 mg total) by mouth 2 (two) times daily. Patient taking differently: Take 1,000 mg by mouth 2 (two) times daily. 1.5 tab twice a day x 2 weeks, then one tab twice a day 10/02/23    Phebe Brasil, MD  Eluxadoline  (VIBERZI ) 100 MG TABS Take 1 tablet (100 mg total) by mouth 2 (two) times daily. 03/29/24   Eliodoro Guerin, DO  esomeprazole  (NEXIUM ) 40 MG capsule Take 1 capsule (40 mg total) by mouth daily. 10/30/23   Eliodoro Guerin, DO  fluticasone  (FLONASE ) 50 MCG/ACT nasal spray Use 2 spray(s) in each nostril once daily 06/26/23   Vicky Grange M, DO  lamoTRIgine  (LAMICTAL ) 100 MG tablet Take 2 tablets (200 mg total) by mouth 2 (two) times daily. 04/01/24   Phebe Brasil, MD  rosuvastatin  (CRESTOR ) 20 MG tablet Take 1 tablet (20 mg total) by mouth daily. 10/30/23   Eliodoro Guerin, DO    Allergies: Terbinafine hcl and Topamax [topiramate]    Review of Systems  Constitutional:  Negative for chills and fever.  HENT:  Negative for congestion and sore throat.   Eyes: Negative.   Respiratory:  Negative for chest tightness and shortness of breath.   Cardiovascular:  Negative for chest pain.  Gastrointestinal:  Positive for diarrhea, nausea and vomiting. Negative for abdominal pain.  Genitourinary: Negative.   Musculoskeletal:  Negative for arthralgias, joint swelling and neck pain.  Skin: Negative.  Negative for rash and wound.  Neurological:  Positive for tremors. Negative for dizziness, weakness, light-headedness, numbness and headaches.  Psychiatric/Behavioral: Negative.    All other systems reviewed and are negative.   Updated Vital Signs BP  130/72   Pulse 69   Temp 97.6 F (36.4 C) (Oral)   Resp 16   Ht 5' 5 (1.651 m)   Wt 70.3 kg   SpO2 100%   BMI 25.79 kg/m   Physical Exam Vitals and nursing note reviewed.  Constitutional:      Appearance: She is well-developed.  HENT:     Head: Normocephalic and atraumatic.     Mouth/Throat:     Mouth: Mucous membranes are moist.   Eyes:     Extraocular Movements: Extraocular movements intact.     Conjunctiva/sclera: Conjunctivae normal.     Pupils: Pupils are equal, round, and reactive to light.     Cardiovascular:     Rate and Rhythm: Normal rate and regular rhythm.     Heart sounds: Normal heart sounds.  Pulmonary:     Effort: Pulmonary effort is normal.     Breath sounds: Normal breath sounds. No wheezing.  Abdominal:     General: Bowel sounds are normal.     Palpations: Abdomen is soft.     Tenderness: There is abdominal tenderness. There is no guarding.     Comments: Mild tenderness periumbilical,  no distention, no guard, BS normal.   Musculoskeletal:        General: Normal range of motion.     Cervical back: Normal range of motion.   Skin:    General: Skin is warm and dry.   Neurological:     Mental Status: She is alert and oriented to person, place, and time.     Motor: No weakness.     Comments: Subtle tremor at baseline which intensifies with intention - difficulty with signing her name for registration    (all labs ordered are listed, but only abnormal results are displayed) Labs Reviewed  CBC WITH DIFFERENTIAL/PLATELET - Abnormal; Notable for the following components:      Result Value   RBC 3.71 (*)    MCV 101.9 (*)    MCH 34.5 (*)    All other components within normal limits  COMPREHENSIVE METABOLIC PANEL WITH GFR - Abnormal; Notable for the following components:   Glucose, Bld 113 (*)    All other components within normal limits  URINALYSIS, ROUTINE W REFLEX MICROSCOPIC - Abnormal; Notable for the following components:   Color, Urine STRAW (*)    Hgb urine dipstick SMALL (*)    Ketones, ur 5 (*)    Leukocytes,Ua MODERATE (*)    Bacteria, UA RARE (*)    All other components within normal limits  LIPASE, BLOOD  VALPROIC ACID  LEVEL  LAMOTRIGINE  LEVEL    EKG: None  Radiology: No results found.   Procedures   Medications Ordered in the ED  sodium chloride  0.9 % bolus 1,000 mL (0 mLs Intravenous Stopped 04/03/24 1448)  ondansetron  (ZOFRAN ) injection 4 mg (4 mg Intravenous Given 04/03/24 1019)                                    Medical  Decision Making Patient presenting with nausea vomiting, diarrhea, although she only had 1 episode of diarrhea prior to arrival in during the remainder of her stay here.  She does endorse having diarrhea  predominant IBS which she does state could be this chronic condition.  She was given Zofran  here after which she was able to tolerate p.o. intake.  Patient is very concerned that these new symptoms are  secondary to medication changes which happened yesterday after visit with her neurologist who decreased her Depakote  and increased her Lamictal  in an attempt to help her with her chronic tremor yet still provide treatment for her seizure disorder.  I spoke with Dr. Gracie Lav per secure chat who recommended repeating Depakote  and Lamictal  levels today, Depakote  is normal range, Lamictal  is pending at this time.  Patient was advised close follow-up with Dr. Gracie Lav regarding any further medication adjustments.  She was stable at time of discharge.  Amount and/or Complexity of Data Reviewed Labs: ordered.    Details: Labs reviewed and are essentially benign, she has a normal CBC, including a normal hemoglobin at 12.8 although she does have been macrocytosis.  Her CMET is normal range as is her lipase and valproic acid  levels.  Her urinalysis is negative except for 11-20 WBCs, nitrate negative, rare bacteria.  She denies urinary symptoms.  Risk Decision regarding hospitalization.        Final diagnoses:  Nausea vomiting and diarrhea    ED Discharge Orders          Ordered    ondansetron  (ZOFRAN ) 4 MG tablet  Every 8 hours PRN        04/03/24 1357               Amaru Burroughs, PA-C 04/03/24 1832

## 2024-04-03 NOTE — ED Notes (Signed)
Pt aware we need urine specimen.  

## 2024-04-03 NOTE — Discharge Instructions (Signed)
 Take the zofran  if needed for continued nausea or vomiting.  If you are only having 1-2 diarrhea episodes per day, I would not treat it with medicine. However, if it becomes more frequent (more than 5 times per day) I recommend imodium.  You may also find certain foods that help with diarrhea, especially bananas, rice,  applesauce, dry toast,  crackers, gingerale. Make sure you are staying hydrated.

## 2024-04-04 ENCOUNTER — Telehealth: Payer: Self-pay | Admitting: Neurology

## 2024-04-04 LAB — LAMOTRIGINE LEVEL: Lamotrigine Lvl: 17 ug/mL (ref 2.0–20.0)

## 2024-04-04 NOTE — Telephone Encounter (Signed)
 I called patient, talk with patient and her mother, she is taking  Depakote  DR 500mg  1.5 bid Lamotrigine  100mg  2 tab twice since June 17th 2025,   Staring June 18th, she complains of nausea, unsteady gait, was treated at emergency room on June 18, Depakote  level was within normal limits 75, lower than previous level of 93 2 months ago, lamotrigine  level is pending  Urinalysis showed moderate leukocytes, rare bacteria, Normal CMP, CBC  She was discharged with Zofran   This morning April 04, 2024, she continued complaints of dizziness, she usually get up at 5 AM, took her first dose of medication then, eat breakfast around 10 AM, by 1 PM, her symptoms will resolve  I have suggested continue Depakote  tapering, titrating up lamotrigine  at lower pace, take medication after breakfast    Week Depakote  DR 500mg  (Am/Pm) Lamotrigine  100mg  (Am/Pm)  1st week 1.5 / 1.5 0 in morning /2 tabs at night  2nd week 1.5/1.5 1/ 2   3rd week 1/ 1.5 1.5/2  4th week 1/1 2/2   Advise her take medication after meal, try to be 10-12 hours apart

## 2024-04-04 NOTE — Telephone Encounter (Signed)
 Call to patient, reviewed in great detail medication taper/titration schedule and patient wrote down and taught back to me. Dr. Gracie Lav had sent a mychart message with schedule as well. Patient was advised to call back with any questions or concerns

## 2024-04-04 NOTE — Telephone Encounter (Signed)
 Patient complaining of nausea, having tremors, stiffness (cannot straighten up) not a seizure for two days.  When to Emergency Room at [facility]. Would like a call back

## 2024-04-16 ENCOUNTER — Ambulatory Visit: Admitting: Family

## 2024-04-16 ENCOUNTER — Encounter: Payer: Self-pay | Admitting: Family

## 2024-04-16 VITALS — BP 117/76 | HR 74 | Temp 97.4°F | Ht 65.0 in | Wt 154.6 lb

## 2024-04-16 DIAGNOSIS — G40909 Epilepsy, unspecified, not intractable, without status epilepticus: Secondary | ICD-10-CM | POA: Diagnosis not present

## 2024-04-16 DIAGNOSIS — R251 Tremor, unspecified: Secondary | ICD-10-CM

## 2024-04-16 DIAGNOSIS — Z09 Encounter for follow-up examination after completed treatment for conditions other than malignant neoplasm: Secondary | ICD-10-CM

## 2024-04-16 DIAGNOSIS — R42 Dizziness and giddiness: Secondary | ICD-10-CM | POA: Diagnosis not present

## 2024-04-16 DIAGNOSIS — R112 Nausea with vomiting, unspecified: Secondary | ICD-10-CM | POA: Diagnosis not present

## 2024-04-16 DIAGNOSIS — R197 Diarrhea, unspecified: Secondary | ICD-10-CM | POA: Diagnosis not present

## 2024-04-16 NOTE — Patient Instructions (Signed)
 Tremor A tremor is trembling or shaking that you cannot control. Most tremors affect the hands or arms. Tremors can also affect the head, vocal cords, face, and other parts of the body. There are many types of tremors. Common types include: Essential tremor. These usually occur in people older than 40. This type of tremor may run in families and can happen in otherwise healthy people. Resting tremor. These occur when the muscles are at rest, such as when your hands are resting in your lap. People with Parkinson's disease often have resting tremors. Postural tremor. These occur when you try to hold a pose, such as keeping your hands outstretched. Kinetic tremor. These occur during purposeful movement, such as trying to touch a finger to your nose. Task-specific tremor. These may occur when you do certain tasks such as writing, speaking, or standing. Psychogenic tremor. These are greatly reduced or go away when you are distracted. These tremors happen due to underlying stress or psychiatric disease. They can happen in people of all ages. Some types of tremors have no known cause. Tremors can also be a symptom of nervous system problems (neurological disorders) that may occur with aging. Some tremors go away with treatment, while others do not. Follow these instructions at home: Lifestyle     If you drink alcohol: Limit how much you have to: 0-1 drink a day for women who are not pregnant. 0-2 drinks a day for men. Know how much alcohol is in a drink. In the [place] one drink equals one 12 oz bottle of beer (355 mL), one 5 oz glass of wine (148 mL), or one 1 oz glass of hard liquor (44 mL). Do not use any products that contain nicotine or tobacco. These products include cigarettes, chewing tobacco, and vaping devices, such as e-cigarettes. If you need help quitting, ask your health care provider. Avoid extreme heat and extreme cold. Limit your caffeine intake, as told by your health care  provider. Try to get 8 hours of sleep each night. Find ways to manage your stress, such as meditation or yoga. General instructions Take over-the-counter and prescription medicines only as told by your health care provider. Keep all follow-up visits. This is important. Contact a health care provider if: You develop a tremor after starting a new medicine. You have a tremor along with other symptoms such as: Numbness. Tingling. Pain. Weakness. Your tremor gets worse. Your tremor interferes with your day-to-day life. Summary A tremor is trembling or shaking that you cannot control. Most tremors affect the hands or arms. Some types of tremors have no known cause. Others may be a symptom of nervous system problems (neurological disorders). Make sure you discuss any tremors you have with your health care provider. This information is not intended to replace advice given to you by your health care provider. Make sure you discuss any questions you have with your health care provider. Document Revised: 07/23/2021 Document Reviewed: 07/23/2021 Elsevier Patient Education  2024 [employer].

## 2024-04-16 NOTE — Progress Notes (Signed)
 Subjective:    Patient ID: Savannah Shaw, female    DOB: Jun 07, 1961, 63 y.o.   MRN: [id number]  Chief Complaint  Patient presents with   Hospitalization Follow-up   PT presents to the office for hospital follow up. She went to the ED on 04/03/24 with nausea, vomiting,diarrhea, and generalized weakness. She reports her Neurologists decreased her depakote  and increased the lamictal  in attempt to help her with the tremor and seizures.    Patient is very concerned that these new symptoms are secondary to medication changes which happened yesterday after visit with her neurologist who decreased her Depakote  and increased her Lamictal  in an attempt to help her with her chronic tremor yet still provide treatment for her seizure disorder. I spoke with Dr. Onita per secure chat who recommended repeating Depakote  and Lamictal  levels today, Depakote  is normal range, Lamictal  is pending at this time. Patient was advised close follow-up with Dr. Onita regarding any further medication adjustments. She was stable at time of discharge.   She reports her nausea, vomiting, and diarrhea have resolved. However, she continues to have tremors, dizziness.   She is requesting a referral to a new Neurologists for second opinion.  HPI    Review of Systems  All other systems reviewed and are negative.   Social History   Socioeconomic History   Marital status: Single    Spouse name: Not on file   Number of children: Not on file   Years of education: Not on file   Highest education level: GED or equivalent  Occupational History   Occupation: Disabled    Comment: Seizures  Tobacco Use   Smoking status: Some Days    Current packs/day: 0.00    Average packs/day: 1 pack/day for 5.0 years (5.0 ttl pk-yrs)    Types: Cigarettes    Start date: 01/10/2006    Last attempt to quit: 01/11/2011    Years since quitting: 13.2   Smokeless tobacco: Never  Vaping Use   Vaping status: Former  Substance and Sexual Activity    Alcohol use: No   Drug use: No   Sexual activity: Not Currently  Other Topics Concern   Not on file  Social History Narrative   Daily Caffeine use - 3   Social Drivers of Health   Financial Resource Strain: Medium Risk (04/12/2024)   Overall Financial Resource Strain (CARDIA)    Difficulty of Paying Living Expenses: Somewhat hard  Food Insecurity: No Food Insecurity (04/12/2024)   Hunger Vital Sign    Worried About Running Out of Food in the Last Year: Never true    Ran Out of Food in the Last Year: Never true  Recent Concern: Food Insecurity - Food Insecurity Present (03/25/2024)   Hunger Vital Sign    Worried About Running Out of Food in the Last Year: Sometimes true    Ran Out of Food in the Last Year: Never true  Transportation Needs: No Transportation Needs (04/12/2024)   PRAPARE - Administrator, Civil Service (Medical): No    Lack of Transportation (Non-Medical): No  Physical Activity: Inactive (04/12/2024)   Exercise Vital Sign    Days of Exercise per Week: 0 days    Minutes of Exercise per Session: Not on file  Stress: Stress Concern Present (04/12/2024)   [employer] of Occupational Health - Occupational Stress Questionnaire    Feeling of Stress: Rather much  Social Connections: Moderately Isolated (04/12/2024)   Social Connection and Isolation Panel  Frequency of Communication with Friends and Family: More than three times a week    Frequency of Social Gatherings with Friends and Family: Twice a week    Attends Religious Services: More than 4 times per year    Active Member of [employer] or Organizations: No    Attends Engineer, structural: Not on file    Marital Status: Divorced   Family History  Problem Relation Age of Onset   Diabetes Mother    Heart disease Mother 17       No details about the type of heart disease.     Breast cancer Paternal Aunt    Prostate cancer Other    Colon cancer Neg Hx         Objective:   Physical  Exam Vitals reviewed.  Constitutional:      General: She is not in acute distress.    Appearance: She is well-developed.  HENT:     Head: Normocephalic and atraumatic.   Eyes:     Pupils: Pupils are equal, round, and reactive to light.   Neck:     Thyroid : No thyromegaly.   Cardiovascular:     Rate and Rhythm: Normal rate and regular rhythm.     Heart sounds: Normal heart sounds. No murmur heard. Pulmonary:     Effort: Pulmonary effort is normal. No respiratory distress.     Breath sounds: Normal breath sounds. No wheezing.  Abdominal:     General: Bowel sounds are normal. There is no distension.     Palpations: Abdomen is soft.     Tenderness: There is no abdominal tenderness.   Musculoskeletal:        General: No tenderness. Normal range of motion.     Cervical back: Normal range of motion and neck supple.   Skin:    General: Skin is warm and dry.   Neurological:     Mental Status: She is alert and oriented to person, place, and time.     Cranial Nerves: No cranial nerve deficit.     Deep Tendon Reflexes: Reflexes are normal and symmetric.     Comments: Tremor of hands and head present   Psychiatric:        Behavior: Behavior normal.        Thought Content: Thought content normal.        Judgment: Judgment normal.       BP 117/76   Pulse 74   Temp (!) 97.4 F (36.3 C)   Ht 5' 5 (1.651 m)   Wt 154 lb 9.6 oz (70.1 kg)   SpO2 97%   BMI 25.73 kg/m      Assessment & Plan:  [facility] MORENCY comes in today with chief complaint of Hospitalization Follow-up   Diagnosis and orders addressed:  1. Seizure disorder (HCC) (Primary) - Ambulatory referral to Neurology  2. Tremor - Ambulatory referral to Neurology  3. Hospital discharge follow-up - Ambulatory referral to Neurology  4. Nausea and vomiting, unspecified vomiting type  5. Diarrhea, unspecified type   6. Dizziness   Labs reviewed from hospital  Hospital notes reviewed  Will place referral to  new Neurologists  Continue current medications  Continue current medications  Keep follow up with specialists  Nausea, vomiting, and diarrhea have resolved.   No follow-ups on file.    Bari Learn, FNP

## 2024-04-22 ENCOUNTER — Telehealth: Payer: Self-pay | Admitting: Family Medicine

## 2024-04-22 NOTE — Telephone Encounter (Unsigned)
 Copied from CRM [id number]. Topic: Referral - Question >> Apr 22, 2024 12:43 PM Sasha H wrote: Reason for CRM:pt had a referral for neuro at [facility] put in a week ago and hasn't heard back. States symptoms are getting worse

## 2024-04-23 ENCOUNTER — Ambulatory Visit

## 2024-04-23 ENCOUNTER — Encounter: Payer: Self-pay | Admitting: Neurology

## 2024-04-23 NOTE — Telephone Encounter (Signed)
 Per Referral Notes it stated [facility] were waiting on notes since Pt is est with [facility].  I r/c to Patient and Patient verified she is wanted to switch care from [facility] to [facility].  Inbasket sent to Lanning BROCKS with [facility] in ref to this information.

## 2024-05-14 NOTE — Telephone Encounter (Signed)
 Copied from CRM [id number]. Topic: General - Other >> May 14, 2024  3:24 PM Carlatta H wrote: Reason for CRM: Patient received a call from the office//Please call back if needed//No messages in chart

## 2024-05-15 NOTE — Telephone Encounter (Signed)
 No information in chart that someone called. Will close.

## 2024-05-16 ENCOUNTER — Ambulatory Visit: Payer: Self-pay

## 2024-05-16 ENCOUNTER — Telehealth: Payer: Self-pay | Admitting: Family Medicine

## 2024-05-16 NOTE — Telephone Encounter (Signed)
 Pt has appt with DOD tomorrow and this is a duplicate call, please see other telephone encounter.

## 2024-05-16 NOTE — Telephone Encounter (Signed)
 Patient called and triaged for nausea, scheduled visit tomorrow, see triage notes.    Copied from CRM #[id number]. Topic: Clinical - Medication Question >> May 16, 2024 10:10 AM Montie POUR wrote: Reason for CRM:  Ashlinn would like a nurse to call her at [phone] to discuss ondansetron  that she was giving at the emergency room on 04/03/24.

## 2024-05-16 NOTE — Telephone Encounter (Signed)
 FYI Only or Action Required?: FYI only for provider.  Patient was last seen in primary care on 04/16/2024 by Lavell Bari LABOR, FNP.  Called Nurse Triage reporting Nausea.  Symptoms began yesterday.  Interventions attempted: Prescription medications: Zofran .  Symptoms are: stable.  Triage Disposition: See PCP When Office is Open (Within 3 Days)  Patient/caregiver understands and will follow disposition?: Yes

## 2024-05-16 NOTE — Telephone Encounter (Addendum)
      Reason for Disposition  Nausea lasts > 1 week  Answer Assessment - Initial Assessment Questions Nausea happens off and on. 2 pills left of what was given in June. Had episode last night.     1. NAUSEA SEVERITY: How bad is the nausea? (e.g., mild, moderate, severe; dehydration, weight loss)     Severe when it happens, but no nausea right now 2. ONSET: When did the nausea begin?     Last night an episode happened 3. VOMITING: Any vomiting? If Yes, ask: How many times today?     No 4. RECURRENT SYMPTOM: Have you had nausea before? If Yes, ask: When was the last time? What happened that time?     Yes, went to the ED on 04/03/24 and was given zofran  Rx 5. CAUSE: What do you think is causing the nausea?     No  Protocols used: Nausea-A-AH

## 2024-05-16 NOTE — Telephone Encounter (Signed)
 Noted, will be addressed during video visit tomorrow with DOD provider.

## 2024-05-17 ENCOUNTER — Encounter: Payer: Self-pay | Admitting: Family Medicine

## 2024-05-17 ENCOUNTER — Telehealth

## 2024-05-17 DIAGNOSIS — R112 Nausea with vomiting, unspecified: Secondary | ICD-10-CM | POA: Diagnosis not present

## 2024-05-17 MED ORDER — ONDANSETRON HCL 4 MG PO TABS
4.0000 mg | ORAL_TABLET | Freq: Three times a day (TID) | ORAL | 0 refills | Status: DC | PRN
Start: 2024-05-17 — End: 2024-06-05

## 2024-05-17 NOTE — Progress Notes (Signed)
 MyChart Video visit  Subjective: Savannah Shaw PCP: Jolinda Norene HERO, DO Savannah Shaw is a 63 y.o. female. Patient provides verbal consent for consult held via video.  Due to COVID-19 pandemic this visit was conducted virtually. This visit type was conducted due to national recommendations for restrictions regarding the COVID-19 Pandemic (e.g. social distancing, sheltering in place) in an effort to limit this patient's exposure and mitigate transmission in our community. All issues noted in this document were discussed and addressed.  A physical exam was not performed with this format.   Location of patient: home Location of provider: [facility] Others present for call: none  1. Seizure disorder/ nausea She reports yesterday she started having vomiting for about 1/2 hour.  She was nauseated the night before.  She messaged Dr Onita who advised to continue Depakote  taper and slower taper onto Lamictal . Patient now notes increased shaking and no change in nausea/ vomiting.  She is having dizzy spells that get so severe she has difficulty walking.  No blood in vomit.  She is on a cancellation list. Keeping fluids down but she just has spells where she can't stop throwing up.   No diarrhea/ blood in stool. Occasional constipation.She has had a major decline since last OV, such that she is using a shower chair/ walker. Next neuro at [facility] not until 06/2024, establishing care with new provider, Dr Georjean.    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, controlled [facility]) neurologist-  dr onita ([facility])   last seizure 2008  (dx age 20)   History of acute pyelonephritis    w/ urosepsis 12-31-2015  resolved   History of kidney stones    Hyperlipidemia    Left ureteral stone    Normal exercise tolerance  test results    12-24-2015 (dr hochrein)  no chest pain, no signficant arrhythmias , 85% max heartrate achieved    Current Outpatient Medications:    cetirizine  (ZYRTEC ) 10 MG tablet, TAKE 1 TABLET BY MOUTH AT BEDTIME, Disp: 90 tablet, Rfl: 1   divalproex  (DEPAKOTE ) 500 MG DR tablet, Take 2 tablets (1,000 mg total) by mouth 2 (two) times daily. (Patient taking differently: Take 1,000 mg by mouth 2 (two) times daily. 1.5 tab twice a day x 2 weeks, then one tab twice a day), Disp: 360 tablet, Rfl: 3   Eluxadoline  (VIBERZI ) 100 MG TABS, Take 1 tablet (100 mg total) by mouth 2 (two) times daily., Disp: 180 tablet, Rfl: 1   esomeprazole  (NEXIUM ) 40 MG capsule, Take 1 capsule (40 mg total) by mouth daily., Disp: 90 capsule, Rfl: 3   fluticasone  (FLONASE ) 50 MCG/ACT nasal spray, Use 2 spray(s) in each nostril once daily, Disp: 16 g, Rfl: 5   lamoTRIgine  (LAMICTAL ) 100 MG tablet, Take 2 tablets (200 mg total) by mouth 2 (two) times daily., Disp: 120 tablet, Rfl: 11   ondansetron  (ZOFRAN ) 4 MG tablet, Take 1 tablet (4 mg total) by mouth every 8 (eight) hours as needed for nausea or vomiting., Disp: 20 tablet, Rfl: 0   rosuvastatin  (CRESTOR ) 20 MG tablet, Take 1 tablet (20 mg total) by mouth daily., Disp: 90 tablet, Rfl: 3  Current Facility-Administered Medications:    denosumab  (PROLIA ) injection 60 mg, 60 mg, Subcutaneous, Once, Jolinda Norene M,  DO  Gen: nontoxic appearing female. HEENT: MMM, sclera white Neuro: tremors observed.  Assessment/ Plan: 63 y.o. female   Nausea and vomiting, unspecified vomiting type - Plan: ondansetron  (ZOFRAN ) 4 MG tablet  Possibly due to medication changes as this is when it seemed to start.  However, we discussed that if symptoms do not improve after modification of her medications we may need to strongly consider referral to gastroenterology for EGD and further evaluation.  She has an appointment already scheduled with Dr. Georjean to establish care.  I will  message her myself as well to see if perhaps there is any way that she can be seen a little sooner than September since this has been an ongoing issue and she is having pretty large decline in ADLs and IADLs.  Start time: 11:20a End time: 11:29a  Total time spent on patient care (including video visit/ documentation): 10 minutes  Chayanne Speir CHRISTELLA Fielding, DO [facility] [phone]

## 2024-06-05 ENCOUNTER — Encounter: Payer: Self-pay | Admitting: Neurology

## 2024-06-05 ENCOUNTER — Other Ambulatory Visit: Payer: Self-pay

## 2024-06-05 ENCOUNTER — Ambulatory Visit: Admitting: Neurology

## 2024-06-05 VITALS — BP 109/69 | HR 82 | Ht 65.0 in | Wt 143.0 lb

## 2024-06-05 DIAGNOSIS — G40301 Generalized idiopathic epilepsy and epileptic syndromes, not intractable, with status epilepticus: Secondary | ICD-10-CM

## 2024-06-05 DIAGNOSIS — R270 Ataxia, unspecified: Secondary | ICD-10-CM

## 2024-06-05 DIAGNOSIS — R292 Abnormal reflex: Secondary | ICD-10-CM | POA: Diagnosis not present

## 2024-06-05 DIAGNOSIS — M810 Age-related osteoporosis without current pathological fracture: Secondary | ICD-10-CM

## 2024-06-05 DIAGNOSIS — R251 Tremor, unspecified: Secondary | ICD-10-CM | POA: Diagnosis not present

## 2024-06-05 NOTE — Progress Notes (Signed)
 NEUROLOGY CONSULTATION NOTE  Savannah Shaw MRN: [id number] DOB: 11-29-1960  Referring provider: Bari Learn, FNP Primary care provider: Dr. Norene Fielding  Reason for consult:  second opinion for seizure medication management  Thank you for your kind referral of Savannah Shaw for consultation of the above symptoms. Although her history is well known to you, please allow me to reiterate it for the purpose of our medical record. The patient was accompanied to the clinic by her father who also provides collateral information. Records and images were personally reviewed where available.   HISTORY OF PRESENT ILLNESS: This is a 63 year old right-handed woman presenting for second opinion regarding her medication regimen. She has a history of seizures since childhood, she had frequent morning body jerks then had her first GTC around age 26. Records from her neurologist Dr. Onita were reviewed. She has been on seizure medication since then, initially on Phenobarbital, Depakote , then Phenobarbital was switched to Tegretol . Due to recurrent seizures, Zonisamide  was added. She was on a combination of Tegretol  300/200/300, Depakote  1000mg  BID, and Zonisamide  100mg  at bedtime for many years. On clinic visit 06/2023, she reported gradual onset bilateral hand tremors and leg twitching at night. MRI brain with and without contrast in 07/2023 no acute changes, there were a few small periventricular white matter changes. EEG showed mild background slowing. Depakote  level 82, Tegretol  level 9.6, Zonisamide  level 2.4. She had no GTCs for many years until possibly 09/2023 when her friend heard a loud noise in the middle of the night, she was difficult to arouse and had no recollection of events, very tired the next day. Her father also reported she would have slow reaction time to questions occurring a couple times a week. Lamotrigine  was added in 09/2023. She was weaned off Zonisamide  (history of kidney stone), then  Tegretol  wean was started in 10/2023. After she tapered off Tegretol  completely at the end of March, she reported hand tremors worsened. She reported this on clinic visit on 04/01/24, Depakote  was reduced to 750mg  BID and Lamotrigine  increased to 200mg  BID. Two days later, she woke up with nausea and vomiting and went to the ER. She reported feeling very unsteady on her feet. Depakote  level 75, Lamotrigine  level 17. She was given a tapering instruction for Depakote  and slower uptitration schedule for Lamotrigine . She has been scared to fully switch over and reprots that since 7/4, she has been taking Depakote  500mg  in AM, 750mg  in PM and Lamotrigine  150mg  in AM, 200mg  in PM.   The nausea and vomiting is better, however she strongly feels these are from her medications. She has prn Zofran  which helps but makes her wild, ornery, bossy. She reports last vomiting episode was a week ago but she thinks her last Zofran  dose was 3 weeks ago. Her cousin gave her an over the counter nausea medication which helps. She feels that on her current regimen, she gets headaches, feels swimmyheaded and sometimes sees double. The swimmyheadedness started in 09/2023 but has worsened. She has to sit until mid-day. It can occur 1-2 times a day lasting for an hour. She has fallen a couple of times, no loss of consciousness. Headaches occur once a week with pain in her temples through her face. She takes Tylenol  which helps.   Her father notices she stares a little bit, he notices it on a daily basis. She loses her train of thought a lot. She has body jerks quite often. He has noticed these even prior  to medication changes. She states she was not having tremors prior to weaning off Tegretol , however notes indicate she reported it in 06/2023. The tremors affect her head, neck, legs. Sometimes she has to hold her right hand with her left to eat. Right tremor feels a little worse. Her writing is terrible. No family history of tremors. No  alcohol. She usually sleeps okay. She lives alone. She has been using her father's walker for the past 3 weeks due to the falls and leg shaking. She does not drive.   Epilepsy Risk Factors: She had a normal birth and early development.  There is no history of febrile convulsions, CNS infections such as meningitis/encephalitis, significant traumatic brain injury, neurosurgical procedures, or family history of seizures.  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, controlled [facility]) neurologist-  dr onita ([facility])   last seizure 2008  (dx age 43)   History of acute pyelonephritis    w/ urosepsis 12-31-2015  resolved   History of kidney stones    Hyperlipidemia    Left ureteral stone    Normal exercise tolerance test results    12-24-2015 (dr hochrein)  no chest pain, no signficant arrhythmias , 85% max heartrate achieved    PAST SURGICAL HISTORY: Past Surgical History:  Procedure Laterality Date   APPENDECTOMY     CARDIOVASCULAR STRESS TEST  07-20-2011   normal nuclear study/  no ischemia, normal LV function and wall motion, ef 76%   CHOLECYSTECTOMY OPEN  1970's   w/  Appendectomy   CYSTOSCOPY W/ URETERAL STENT PLACEMENT Left 12/31/2015   Procedure: CYSTOSCOPY WITH RETROGRADE PYELOGRAM/URETERAL STENT PLACEMENT;  Surgeon: Belvie LITTIE Clara, MD;  Location: [facility];  Service: Urology;  Laterality: Left;   CYSTOSCOPY W/ URETERAL STENT PLACEMENT Left 01/14/2016   Procedure: CYSTOSCOPY WITH STENT REPLACEMENT;  Surgeon: Belvie LITTIE Clara, MD;  Location: [facility];  Service: Urology;  Laterality: Left;   CYSTOSCOPY/RETROGRADE/URETEROSCOPY/STONE EXTRACTION WITH BASKET Left 01/14/2016   Procedure: CYSTOSCOPY/RETROGRADE/URETEROSCOPY/STONE EXTRACTION WITH BASKET;  Surgeon: Belvie LITTIE Clara, MD;  Location: [facility];  Service: Urology;  Laterality: Left;   HOLMIUM LASER APPLICATION Left 01/14/2016   Procedure: HOLMIUM LASER APPLICATION;  Surgeon: Belvie LITTIE Clara, MD;  Location: [facility];  Service: Urology;  Laterality: Left;   OOPHORECTOMY  1980's   Unilateral oophorectomy and  Bilateral Salpingectomy (tubal)   TONSILLECTOMY AND ADENOIDECTOMY  as child   TRANSTHORACIC ECHOCARDIOGRAM  07-20-2011   ef 55-65%/  trivial MR/  mild TR    MEDICATIONS: Current Outpatient Medications on File Prior to Visit  Medication Sig Dispense Refill   cetirizine  (ZYRTEC ) 10 MG tablet TAKE 1 TABLET BY MOUTH AT BEDTIME 90 tablet 1   divalproex  (DEPAKOTE ) 500 MG DR tablet Take 2 tablets (1,000 mg total) by mouth 2 (two) times daily. (Taking 1 tab in AM, 1.5 tabs in PM) 360 tablet 3   Eluxadoline  (VIBERZI ) 100 MG TABS Take 1 tablet (100 mg total) by mouth 2 (two) times daily. 180 tablet 1   esomeprazole  (NEXIUM ) 40 MG capsule Take 1 capsule (40 mg total) by mouth daily. 90 capsule 3   fluticasone  (FLONASE ) 50 MCG/ACT nasal spray Use 2 spray(s) in each nostril once daily 16 g 5   lamoTRIgine  (LAMICTAL ) 100 MG tablet Take 2 tablets (200 mg total) by mouth 2 (  two) times daily. (Taking 1.5 tabs in AM, 2 tabs in PM) 120 tablet 11   rosuvastatin  (CRESTOR ) 20 MG tablet Take 1 tablet (20 mg total) by mouth daily. 90 tablet 3   Current Facility-Administered Medications on File Prior to Visit  Medication Dose Route Frequency Provider Last Rate Last Admin   denosumab  (PROLIA ) injection 60 mg  60 mg Subcutaneous Once Gottschalk, Ashly M, DO        ALLERGIES: Allergies  Allergen Reactions   Terbinafine Hcl Rash    Lamasil   Topamax [Topiramate] Rash    FAMILY HISTORY: Family History  Problem Relation Age of Onset   Heart disease Mother 14       No details about the type of heart disease.     Diabetes Mother    Breast cancer Paternal Aunt    Prostate cancer Other    Colon cancer Neg Hx      SOCIAL HISTORY: Social History   Socioeconomic History   Marital status: Single    Spouse name: Not on file   Number of children: Not on file   Years of education: Not on file   Highest education level: GED or equivalent  Occupational History   Occupation: Disabled    Comment: Seizures  Tobacco Use   Smoking status: Former    Current packs/day: 0.00    Average packs/day: 1 pack/day for 5.0 years (5.0 ttl pk-yrs)    Types: Cigarettes    Start date: 01/10/2006    Quit date: 01/11/2011    Years since quitting: 13.4   Smokeless tobacco: Never  Vaping Use   Vaping status: Former  Substance and Sexual Activity   Alcohol use: No   Drug use: No   Sexual activity: Not Currently  Other Topics Concern   Not on file  Social History Narrative   Daily Caffeine use - 3   Right hand   Living alone, 2nd floor w/ 20 steps   Social Drivers of Health   Financial Resource Strain: Medium Risk (04/12/2024)   Overall Financial Resource Strain (CARDIA)    Difficulty of Paying Living Expenses: Somewhat hard  Food Insecurity: No Food Insecurity (04/12/2024)   Hunger Vital Sign    Worried About Running Out of Food in the Last Year: Never true    Ran Out of Food in the Last Year: Never true  Recent Concern: Food Insecurity - Food Insecurity Present (03/25/2024)   Hunger Vital Sign    Worried About Running Out of Food in the Last Year: Sometimes true    Ran Out of Food in the Last Year: Never true  Transportation Needs: No Transportation Needs (04/12/2024)   PRAPARE - Administrator, Civil Service (Medical): No    Lack of Transportation (Non-Medical): No  Physical Activity: Inactive (04/12/2024)   Exercise Vital Sign    Days of Exercise per Week: 0 days    Minutes of Exercise per Session: Not on file  Stress: Stress Concern Present (04/12/2024)   [employer] of Occupational Health - Occupational Stress Questionnaire    Feeling of Stress: Rather much  Social Connections:  Moderately Isolated (04/12/2024)   Social Connection and Isolation Panel    Frequency of Communication with Friends and Family: More than three times a week    Frequency of Social Gatherings with Friends and Family: Twice a week    Attends Religious Services: More than 4 times per year    Active Member of Clubs or Organizations: No  Attends Banker Meetings: Not on file    Marital Status: Divorced  Intimate Partner Violence: Not on file     PHYSICAL EXAM: Vitals:   06/05/24 1015  BP: 109/69  Pulse: 82  SpO2: 97%   General: No acute distress Head:  Normocephalic/atraumatic Skin/Extremities: No rash, no edema Neurological Exam: Mental status: alert and awake, no dysarthria or aphasia, Fund of knowledge is appropriate.   Attention and concentration are normal.   Cranial nerves: CN I: not tested CN II: pupils equal, round visual fields intact CN III, IV, VI:  full range of motion, no nystagmus, no ptosis CN V: facial sensation intact CN VII: upper and lower face symmetric CN VIII: hearing intact to conversation Bulk & Tone: normal, no cogwheeling, no fasciculations. Motor: 5/5 throughout with no pronator drift. Sensation: intact to light touch, cold, pin, vibration  sense.  No extinction to double simultaneous stimulation.  Deep Tendon Reflexes: brisk +3 throughout with hyperactive pectoralis reflex bilaterally, +Hoffman sign bilaterally, no ankle clonus Plantar responses: downgoing bilaterally Cerebellar: no incoordination on finger to nose, heel to shin. No dysdiadochokinesia Gait: gait is ataxic with walker Tremor: +left hand resting tremor, +bilateral postural and action tremor, right foot tremor.   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 She reports tremors significantly worsened once  she weaned off Tegretol . On review of records, she reported gradual onset bilateral hand tremors and leg twitching in 06/2023, MRI brain no acute changes, EEG showed mild slowing. Tegretol  wean was started in 09/2023. She reports feeling swimmyheaded since then, however significant worsening of tremors since 12/2023 leading to ER visit in 03/2024 for nausea/vomiting a few days after increasing Lamotrigine  to 200mg  BID and reducing Depakote  to 500/750mg . She is currently on Depakote  500/750 and Lamotrigine  150/200. Exam today shows left resting tremor, bilateral action tremor, no other parkinsonian signs. She is noted to have hyperreflexia and ataxic gait. It is unclear if all these are due to medications, discussed repeating brain MRI with and without contrast to assess for underlying structural abnormality. They continue to report symptoms suggestive of absence seizures and myoclonic jerks on current regimen, we will do a 1-hour EEG. She would like to get off Lamotrigine . Discussed that we cannot stop it suddenly, weaning instructions provided. She will increase Depakote  gradually back to 1000mg  BID (monitoring tremor as we increase back Depakote ), and restart Tegretol  gradually to 300mg  BID over the next 3 weeks. She does not drive. Follow-up in 3 weeks, call for any changes.   Thank you for allowing me to participate in the care of this patient. Please do not hesitate to call for any questions or concerns.   Savannah Shaw, M.D.  CC: Dr. Jolinda

## 2024-06-05 NOTE — Patient Instructions (Addendum)
 Good to meet you.  Schedule MRI brain with and without contrast  2. Schedule 1-hour EEG  3. Start doing the medication changes for Depakote , Lamotrigine , and Tegretol  (carbamazepine ) as instructed 8/21-8/27:  Depakote  500mg : take 1 tab in AM, 2 tabs in PM Lamotrigine  100mg : take 1 tab in AM, 2 tabs in PM Tegretol  200mg : Take 1 tablet in PM  8/28-9/3: Depakote  500mg : take 2 tablets twice a day Lamotrigine  100mg : Take 1 tablet twice a day Tegretol  200mg : Take 1 tablet twice a day  9/4-9/10:  Depakote  500mg : take 2 tablets twice a day Lamotrigine  100mg : take 1/2 tab in AM, 1 tab in PM Tegretol  200mg : Take 1.5 tabs twice a day  4. Follow-up in June 27, 2024 at 12nn, call for any changes   Seizure Precautions: 1. If medication has been prescribed for you to prevent seizures, take it exactly as directed.  Do not stop taking the medicine without talking to your doctor first, even if you have not had a seizure in a long time.   2. Avoid activities in which a seizure would cause danger to yourself or to others.  Don't operate dangerous machinery, swim alone, or climb in high or dangerous places, such as on ladders, roofs, or girders.  Do not drive unless your doctor says you may.  3. If you have any warning that you may have a seizure, lay down in a safe place where you can't hurt yourself.    4.  No driving for 6 months from last seizure, as per [place]  state law.   Please refer to the following link on the Epilepsy Foundation of [employer]'s website for more information: http://www.epilepsyfoundation.org/answerplace/Social/driving/drivingu.cfm   5.  Maintain good sleep hygiene.  6.  Contact your doctor if you have any problems that may be related to the medicine you are taking.  7.  Call 911 and bring the patient back to the ED if:        A.  The seizure lasts longer than 5 minutes.       B.  The patient doesn't awaken shortly after the seizure  C.  The patient has new  problems such as difficulty seeing, speaking or moving  D.  The patient was injured during the seizure  E.  The patient has a temperature over 102 F (39C)  F.  The patient vomited and now is having trouble breathing

## 2024-06-06 ENCOUNTER — Ambulatory Visit: Admitting: Neurology

## 2024-06-06 DIAGNOSIS — G40301 Generalized idiopathic epilepsy and epileptic syndromes, not intractable, with status epilepticus: Secondary | ICD-10-CM

## 2024-06-06 NOTE — Progress Notes (Unsigned)
 EEG complete and ready for review.

## 2024-06-07 NOTE — Procedures (Signed)
 ELECTROENCEPHALOGRAM REPORT  Date of Study: 06/06/2024  Patient's Name: Savannah Shaw MRN: [id number] Date of Birth: 07-Nov-1960  Referring Provider: Dr. Darice Shivers  Clinical History: This is a 63 year old woman with a history of epilepsy since childhood, with recurrent episodes of losing track of conversations, myoclonic jerks. EEG for classification.  Medications: Lamotrigine , Depakote   Technical Summary: A multichannel digital 1-hour EEG recording measured by the international 10-20 system with electrodes applied with paste and impedances below 5000 ohms performed in our laboratory with EKG monitoring in an awake and asleep patient.  Hyperventilation and photic stimulation were performed.  The digital EEG was referentially recorded, reformatted, and digitally filtered in a variety of bipolar and referential montages for optimal display.    Description: The patient is awake and asleep during the recording.  During maximal wakefulness, there is a symmetric, medium voltage 7 Hz posterior dominant rhythm that attenuates with eye opening. The record is symmetric. During drowsiness and sleep, there is an increase in theta slowing of the background.  Vertex waves and symmetric sleep spindles were seen.  Photic stimulation did not elicit any abnormalities.  There were no epileptiform discharges or electrographic seizures seen.    EKG lead was unremarkable.  Impression: This 1-hour awake and asleep EEG is mildly abnormal due to slowing of the posterior dominant rhythm.  Clinical Correlation of the above findings indicates diffuse cerebral dysfunction that is non-specific in etiology and can be seen with hypoxic/ischemic injury, toxic/metabolic encephalopathies, neurodegenerative disorders, medication effect, or excessive drowsiness.  The absence of epileptiform discharges does not rule out a clinical diagnosis of epilepsy.  Clinical correlation is advised.   Darice Shivers, M.D.

## 2024-06-12 ENCOUNTER — Ambulatory Visit: Payer: Self-pay | Admitting: Neurology

## 2024-06-15 ENCOUNTER — Other Ambulatory Visit (HOSPITAL_COMMUNITY): Payer: Self-pay

## 2024-06-25 ENCOUNTER — Encounter: Payer: Self-pay | Admitting: Neurology

## 2024-06-27 ENCOUNTER — Encounter: Payer: Self-pay | Admitting: Neurology

## 2024-06-27 ENCOUNTER — Ambulatory Visit: Admitting: Neurology

## 2024-06-27 VITALS — BP 132/73 | HR 72 | Ht 65.0 in | Wt 145.0 lb

## 2024-06-27 DIAGNOSIS — G40301 Generalized idiopathic epilepsy and epileptic syndromes, not intractable, with status epilepticus: Secondary | ICD-10-CM | POA: Diagnosis not present

## 2024-06-27 DIAGNOSIS — R292 Abnormal reflex: Secondary | ICD-10-CM

## 2024-06-27 MED ORDER — CARBAMAZEPINE 200 MG PO TABS: ORAL_TABLET | ORAL | 3 refills | Status: DC

## 2024-06-27 MED ORDER — LAMOTRIGINE 100 MG PO TABS: ORAL_TABLET | ORAL | 3 refills | Status: DC

## 2024-06-27 MED ORDER — DIVALPROEX SODIUM 500 MG PO DR TAB: 1000.0000 mg | DELAYED_RELEASE_TABLET | Freq: Two times a day (BID) | ORAL | 3 refills | Status: DC

## 2024-06-27 NOTE — Progress Notes (Signed)
 NEUROLOGY FOLLOW UP OFFICE NOTE  Savannah Shaw [id number] [birth year]  HISTORY OF PRESENT ILLNESS: I had the pleasure of seeing Savannah Shaw in follow-up in the neurology clinic on 06/27/2024. She is again accompanied by her father who helps supplement the history today. The patient was last seen 3 weeks ago for second opinion regarding seizure medication management. She was reporting dizziness, nausea, increased tremors, and falls. She was ataxic and hyperreflexic on last visit, needing a walker. She is scheduled for the brain MRI next week. She felt all these symptoms started with initiation of Lamotrigine  and discontinuation of Carbamazepine . She was given a schedule to wean down Lamotrigine  and restart Carbamazepine . Depakote  increased back to 1000mg  BID (500mg : 2 tabs BID), she is currently on Lamotrigine  50mg  in AM, 100mg  in PM (100mg  1/2 tab in AM, 1 tab in PM) and Carbamazepine  300mg  BID (200mg : 1.5 tabs BID).   She presents today in good spirits saying she is feeling better. She has a cane for balance but does not need the walker anymore.  She can get on the shower bench now. She denies any further dizziness, balance is better, the tremors are still there but better, R>L. She had a half day of double vision on 8/25 and a little double vision on 8/29, feeling a little swimmyheaded but not like before, lasting half a day. On 9/3, she felt off balance. No falls. Her father has not seen any staring episodes, she still has daily episodes where she starts to say something and loses her train of thought. It hits her then she can go on. She has jerks in either hand but it is not all the time. She has a couple of headaches once a week but feels they are less intense, she lays down and takes Tylenol .   History on Initial Assessment 06/05/2024: This is a 63 year old right-handed woman presenting for second opinion regarding her medication regimen. She has a history of seizures since childhood, she had frequent  morning body jerks then had her first GTC around age 49. Records from her neurologist Dr. Onita were reviewed. She has been on seizure medication since then, initially on Phenobarbital, Depakote , then Phenobarbital was switched to Tegretol . Due to recurrent seizures, Zonisamide  was added. She was on a combination of Tegretol  300/200/300, Depakote  1000mg  BID, and Zonisamide  100mg  at bedtime for many years. On clinic visit 06/2023, she reported gradual onset bilateral hand tremors and leg twitching at night. MRI brain with and without contrast in 07/2023 no acute changes, there were a few small periventricular white matter changes. EEG showed mild background slowing. Depakote  level 82, Tegretol  level 9.6, Zonisamide  level 2.4. She had no GTCs for many years until possibly 09/2023 when her friend heard a loud noise in the middle of the night, she was difficult to arouse and had no recollection of events, very tired the next day. Her father also reported she would have slow reaction time to questions occurring a couple times a week. Lamotrigine  was added in 09/2023. She was weaned off Zonisamide  (history of kidney stone), then Tegretol  wean was started in 10/2023. After she tapered off Tegretol  completely at the end of March, she reported hand tremors worsened. She reported this on clinic visit on 04/01/24, Depakote  was reduced to 750mg  BID and Lamotrigine  increased to 200mg  BID. Two days later, she woke up with nausea and vomiting and went to the ER. She reported feeling very unsteady on her feet. Depakote  level 75, Lamotrigine  level 17.  She was given a tapering instruction for Depakote  and slower uptitration schedule for Lamotrigine . She has been scared to fully switch over and reprots that since 7/4, she has been taking Depakote  500mg  in AM, 750mg  in PM and Lamotrigine  150mg  in AM, 200mg  in PM.   The nausea and vomiting is better, however she strongly feels these are from her medications. She has prn Zofran  which helps  but makes her wild, ornery, bossy. She reports last vomiting episode was a week ago but she thinks her last Zofran  dose was 3 weeks ago. Her cousin gave her an over the counter nausea medication which helps. She feels that on her current regimen, she gets headaches, feels swimmyheaded and sometimes sees double. The swimmyheadedness started in 09/2023 but has worsened. She has to sit until mid-day. It can occur 1-2 times a day lasting for an hour. She has fallen a couple of times, no loss of consciousness. Headaches occur once a week with pain in her temples through her face. She takes Tylenol  which helps.   Her father notices she stares a little bit, he notices it on a daily basis. She loses her train of thought a lot. She has body jerks quite often. He has noticed these even prior to medication changes. She states she was not having tremors prior to weaning off Tegretol , however notes indicate she reported it in 06/2023. The tremors affect her head, neck, legs. Sometimes she has to hold her right hand with her left to eat. Right tremor feels a little worse. Her writing is terrible. No family history of tremors. No alcohol. She usually sleeps okay. She lives alone. She has been using her father's walker for the past 3 weeks due to the falls and leg shaking. She does not drive.   Epilepsy Risk Factors: She had a normal birth and early development.  There is no history of febrile convulsions, CNS infections such as meningitis/encephalitis, significant traumatic brain injury, neurosurgical procedures, or family history of seizures.  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, controlled [facility]) neurologist-  dr onita ([facility])   last seizure 2008  (dx age 63)   History of acute pyelonephritis    w/ urosepsis 12-31-2015  resolved   History of kidney  stones    Hyperlipidemia    Left ureteral stone    Normal exercise tolerance test results    12-24-2015 (dr hochrein)  no chest pain, no signficant arrhythmias , 85% max heartrate achieved    MEDICATIONS: Current Outpatient Medications on File Prior to Visit  Medication Sig Dispense Refill   cetirizine  (ZYRTEC ) 10 MG tablet TAKE 1 TABLET BY MOUTH AT BEDTIME 90 tablet 1   divalproex  (DEPAKOTE ) 500 MG DR tablet Take 2 tablets (1,000 mg total) by mouth 2 (two) times daily. 360 tablet 3   Eluxadoline  (VIBERZI ) 100 MG TABS Take 1 tablet (100 mg total) by mouth 2 (two) times daily. 180 tablet 1   esomeprazole  (NEXIUM ) 40 MG capsule Take 1 capsule (40 mg total) by mouth daily. 90 capsule 3   fluticasone  (FLONASE ) 50 MCG/ACT nasal spray Use 2 spray(s) in each nostril once daily 16 g 5   lamoTRIgine  (LAMICTAL ) 100 MG tablet Take 2 tablets (200 mg total) by mouth 2 (two) times daily. (Patient taking differently: Take 200 mg by mouth 2 (two) times daily. Taking 1.5  tabs in AM, 2 tabs in PM) 120 tablet 11   rosuvastatin  (CRESTOR ) 20 MG tablet Take 1 tablet (20 mg total) by mouth daily. 90 tablet 3   Current Facility-Administered Medications on File Prior to Visit  Medication Dose Route Frequency Provider Last Rate Last Admin   denosumab  (PROLIA ) injection 60 mg  60 mg Subcutaneous Once Gottschalk, Ashly M, DO        ALLERGIES: Allergies  Allergen Reactions   Terbinafine Hcl Rash    Lamasil   Topamax [Topiramate] Rash    FAMILY HISTORY: Family History  Problem Relation Age of Onset   Heart disease Mother 58       No details about the type of heart disease.     Diabetes Mother    Breast cancer Paternal Aunt    Prostate cancer Other    Colon cancer Neg Hx     SOCIAL HISTORY: Social History   Socioeconomic History   Marital status: Single    Spouse name: Not on file   Number of children: Not on file   Years of education: Not on file   Highest education level: GED or equivalent   Occupational History   Occupation: Disabled    Comment: Seizures  Tobacco Use   Smoking status: Former    Current packs/day: 0.00    Average packs/day: 1 pack/day for 5.0 years (5.0 ttl pk-yrs)    Types: Cigarettes    Start date: 01/10/2006    Quit date: 01/11/2011    Years since quitting: 13.4   Smokeless tobacco: Never  Vaping Use   Vaping status: Former  Substance and Sexual Activity   Alcohol use: No   Drug use: No   Sexual activity: Not Currently  Other Topics Concern   Not on file  Social History Narrative   Daily Caffeine use - 3   Right hand   Living alone, 2nd floor w/ 20 steps   Social Drivers of Health   Financial Resource Strain: Medium Risk (04/12/2024)   Overall Financial Resource Strain (CARDIA)    Difficulty of Paying Living Expenses: Somewhat hard  Food Insecurity: No Food Insecurity (04/12/2024)   Hunger Vital Sign    Worried About Running Out of Food in the Last Year: Never true    Ran Out of Food in the Last Year: Never true  Recent Concern: Food Insecurity - Food Insecurity Present (03/25/2024)   Hunger Vital Sign    Worried About Running Out of Food in the Last Year: Sometimes true    Ran Out of Food in the Last Year: Never true  Transportation Needs: No Transportation Needs (04/12/2024)   PRAPARE - Administrator, Civil Service (Medical): No    Lack of Transportation (Non-Medical): No  Physical Activity: Inactive (04/12/2024)   Exercise Vital Sign    Days of Exercise per Week: 0 days    Minutes of Exercise per Session: Not on file  Stress: Stress Concern Present (04/12/2024)   [employer] of Occupational Health - Occupational Stress Questionnaire    Feeling of Stress: Rather much  Social Connections: Moderately Isolated (04/12/2024)   Social Connection and Isolation Panel    Frequency of Communication with Friends and Family: More than three times a week    Frequency of Social Gatherings with Friends and Family: Twice a week     Attends Religious Services: More than 4 times per year    Active Member of [employer] or Organizations: No    Attends Club or  Organization Meetings: Not on file    Marital Status: Divorced  Intimate Partner Violence: Not on file     PHYSICAL EXAM: Vitals:   06/27/24 1145  BP: 132/73  Pulse: 72  SpO2: 98%   General: No acute distress Head:  Normocephalic/atraumatic Skin/Extremities: No rash, no edema Neurological Exam: alert awake. No aphasia or dysarthria. Fund of knowledge is appropriate.  Attention and concentration are normal.   Cranial nerves: Pupils equal, round. Extraocular movements intact with no nystagmus. Visual fields full.  No facial asymmetry.  Motor: Bulk and tone normal, muscle strength 5/5 throughout with no pronator drift.  Reflexes again noted to be brisk +3 throughout with +bilateral Hoffman sign and hyperactive pectoralis reflexes. No ankle clonus. Finger to nose testing intact.  Gait is much improved, no ataxia today, able to walk without assistance. Tremor is also better, still present with bilateral endpoint tremor, no resting tremor. There is a slight side to side head tremor.    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 Exam showed hyperreflexia. Medications were adjusted, she is on a lower dose of Lamotrigine  and back on Tegretol  and feels much better. No further ataxia or significant dizziness, tremors better. She is still hyperreflexic, proceed with brain MRI as scheduled. Continue Depakote  1000mg  BID (500mg : 2 tabs BID), }Lamotrigine  50mg  in AM, 100mg  in PM (100mg  1/2 tab in AM, 1 tab in PM) and Carbamazepine  300mg  BID (200mg : 1.5 tabs BID) for now. Her father has not seen staring episodes but she continues to report daily episodes of losing train of thought. Schedule 72-hour ambulatory EEG to  characterize symptoms. She does not drive. Follow-up  after testing, call for any changes.    Thank you for allowing me to participate in her care.  Please do not hesitate to call for any questions or concerns.    Darice Shivers, M.D.   CC: Dr. Jolinda

## 2024-06-27 NOTE — Patient Instructions (Signed)
 It's so good to see you doing better!  Proceed with brain MRI as scheduled  2. Schedule 3-day home EEG  3. Follow-up after testing, call for any changes   Seizure Precautions: 1. If medication has been prescribed for you to prevent seizures, take it exactly as directed.  Do not stop taking the medicine without talking to your doctor first, even if you have not had a seizure in a long time.   2. Avoid activities in which a seizure would cause danger to yourself or to others.  Don't operate dangerous machinery, swim alone, or climb in high or dangerous places, such as on ladders, roofs, or girders.  Do not drive unless your doctor says you may.  3. If you have any warning that you may have a seizure, lay down in a safe place where you can't hurt yourself.    4.  No driving for 6 months from last seizure, as per [place]  state law.   Please refer to the following link on the Epilepsy Foundation of America's website for more information: http://www.epilepsyfoundation.org/answerplace/Social/driving/drivingu.cfm   5.  Maintain good sleep hygiene.  6.  Contact your doctor if you have any problems that may be related to the medicine you are taking.  7.  Call 911 and bring the patient back to the ED if:        A.  The seizure lasts longer than 5 minutes.       B.  The patient doesn't awaken shortly after the seizure  C.  The patient has new problems such as difficulty seeing, speaking or moving  D.  The patient was injured during the seizure  E.  The patient has a temperature over 102 F (39C)  F.  The patient vomited and now is having trouble breathing

## 2024-06-30 ENCOUNTER — Other Ambulatory Visit

## 2024-07-01 ENCOUNTER — Ambulatory Visit: Admitting: Neurology

## 2024-07-03 ENCOUNTER — Ambulatory Visit
Admission: RE | Admit: 2024-07-03 | Discharge: 2024-07-03 | Disposition: A | Discharge status: Home / Self Care | Source: Ambulatory Visit | Attending: Neurology | Admitting: Neurology

## 2024-07-03 DIAGNOSIS — R270 Ataxia, unspecified: Secondary | ICD-10-CM

## 2024-07-03 DIAGNOSIS — R292 Abnormal reflex: Secondary | ICD-10-CM

## 2024-07-03 DIAGNOSIS — I6782 Cerebral ischemia: Secondary | ICD-10-CM | POA: Diagnosis not present

## 2024-07-03 DIAGNOSIS — R251 Tremor, unspecified: Secondary | ICD-10-CM

## 2024-07-03 MED ORDER — GADOPICLENOL 0.5 MMOL/ML IV SOLN
6.0000 mL | Freq: Once | INTRAVENOUS | Status: AC | PRN
  Administered 2024-07-03: 6 mL via INTRAVENOUS

## 2024-07-10 NOTE — Progress Notes (Signed)
 Notified pt w/ MRI result and agreed to have Home EEG.

## 2024-07-19 ENCOUNTER — Telehealth: Admitting: Nurse Practitioner

## 2024-07-19 DIAGNOSIS — B9789 Other viral agents as the cause of diseases classified elsewhere: Secondary | ICD-10-CM | POA: Diagnosis not present

## 2024-07-19 DIAGNOSIS — J019 Acute sinusitis, unspecified: Secondary | ICD-10-CM

## 2024-07-20 MED ORDER — IPRATROPIUM BROMIDE 0.03 % NA SOLN: 2.0000 | Freq: Two times a day (BID) | NASAL | 0 refills | Status: DC

## 2024-07-20 NOTE — Progress Notes (Signed)
 We are sorry that you are not feeling well.  Here is how we plan to help!  Based on what you have shared with me it looks like you have sinusitis.  Sinusitis is inflammation and infection in the sinus cavities of the head.    Based on your presentation I believe you most likely have Acute Viral Sinusitis.This is an infection most likely caused by a virus. There is not specific treatment for viral sinusitis other than to help you with the symptoms until the infection runs its course. Viral sinusitis can last up to 7-10 days. If you are not feeling better in 10 days please feel free to reach out to us  for consideration of antibiotic.    You may use an oral decongestant such as Mucinex D or if you have glaucoma or high blood pressure use plain Mucinex. Saline nasal spray help and can safely be used as often as needed for congestion, I have prescribed: Ipratropium Bromide nasal spray 0.03% 2 sprays in eah nostril 2-3 times a day  Some authorities believe that zinc sprays or the use of Echinacea may shorten the course of your symptoms.  Sinus infections are not as easily transmitted as other respiratory infection, however we still recommend that you avoid close contact with loved ones, especially the very young and elderly.  Remember to wash your hands thoroughly throughout the day as this is the number one way to prevent the spread of infection!  Home Care: Only take medications as instructed by your medical team. Do not take these medications with alcohol. A steam or ultrasonic humidifier can help congestion.  You can place a towel over your head and breathe in the steam from hot water  coming from a faucet. Avoid close contacts especially the very young and the elderly. Cover your mouth when you cough or sneeze. Always remember to wash your hands.  Get Help Right Away If: You develop worsening fever or sinus pain. You develop a severe head ache or visual changes. Your symptoms persist after you  have completed your treatment plan.  Make sure you Understand these instructions. Will watch your condition. Will get help right away if you are not doing well or get worse.  Your e-visit answers were reviewed by a board certified advanced clinical practitioner to complete your personal care plan.  Depending on the condition, your plan could have included both over the counter or prescription medications.  If there is a problem please reply  once you have received a response from your provider.  Your safety is important to us .  If you have drug allergies check your prescription carefully.    You can use MyChart to ask questions about today's visit, request a non-urgent call back, or ask for a work or school excuse for 24 hours related to this e-Visit. If it has been greater than 24 hours you will need to follow up with your provider, or enter a new e-Visit to address those concerns.  You will get an e-mail in the next two days asking about your experience.  I hope that your e-visit has been valuable and will speed your recovery. Thank you for using e-visits.  I have spent 5 minutes in review of e-visit questionnaire, review and updating patient chart, medical decision making and response to patient.   Maleny Candy W Arvel Oquinn, NP

## 2024-07-22 ENCOUNTER — Ambulatory Visit: Payer: Self-pay | Admitting: Family Medicine

## 2024-07-22 ENCOUNTER — Ambulatory Visit: Admitting: Family Medicine

## 2024-07-22 ENCOUNTER — Ambulatory Visit (INDEPENDENT_AMBULATORY_CARE_PROVIDER_SITE_OTHER)

## 2024-07-22 ENCOUNTER — Encounter: Payer: Self-pay | Admitting: Family Medicine

## 2024-07-22 VITALS — BP 107/66 | HR 81 | Temp 97.3°F | Wt 149.6 lb

## 2024-07-22 DIAGNOSIS — J069 Acute upper respiratory infection, unspecified: Secondary | ICD-10-CM | POA: Diagnosis not present

## 2024-07-22 LAB — VERITOR FLU A/B WAIVED
Influenza A: NEGATIVE
Influenza B: NEGATIVE

## 2024-07-22 MED ORDER — FLUCONAZOLE 150 MG PO TABS: 150.0000 mg | ORAL_TABLET | Freq: Once | ORAL | 0 refills | Status: AC

## 2024-07-22 MED ORDER — CEFDINIR 300 MG PO CAPS: 300.0000 mg | ORAL_CAPSULE | Freq: Two times a day (BID) | ORAL | 0 refills | Status: DC

## 2024-07-22 MED ORDER — BENZONATATE 100 MG PO CAPS: 100.0000 mg | ORAL_CAPSULE | Freq: Three times a day (TID) | ORAL | 0 refills | Status: DC | PRN

## 2024-07-22 NOTE — Progress Notes (Signed)
 Subjective: CC:URI PCP: Jolinda Norene HERO, DO YEP:[name] is a 63 y.o. female presenting to clinic today for:  URI She reports onset of upper respiratory symptoms several days ago.  She sought care as an e-visit and was prescribed a nasal spray but notes that this did not help her symptoms.  She reports cough is productive with brown sputum.  No hemoptysis.  She reports wheezing, subjective fevers, chills, sore throat, rhinorrhea.  Mom's had a cough for several months but no other known sick contacts.  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, controlled [facility]) neurologist-  dr onita ([facility])   last seizure 2008  (dx age 34)   History of acute pyelonephritis    w/ urosepsis 12-31-2015  resolved   History of kidney stones    Hyperlipidemia    Left ureteral stone    Normal exercise tolerance test results    12-24-2015 (dr hochrein)  no chest pain, no signficant arrhythmias , 85% max heartrate achieved    Current Outpatient Medications:    carbamazepine  (TEGRETOL ) 200 MG tablet, Take 1 and 1/2 tablets twice a day, Disp: 270 tablet, Rfl: 3   cetirizine  (ZYRTEC ) 10 MG tablet, TAKE 1 TABLET BY MOUTH AT BEDTIME, Disp: 90 tablet, Rfl: 1   divalproex  (DEPAKOTE ) 500 MG DR tablet, Take 2 tablets (1,000 mg total) by mouth 2 (two) times daily., Disp: 360 tablet, Rfl: 3   Eluxadoline  (VIBERZI ) 100 MG TABS, Take 1 tablet (100 mg total) by mouth 2 (two) times daily., Disp: 180 tablet, Rfl: 1   esomeprazole  (NEXIUM ) 40 MG capsule, Take 1 capsule (40 mg total) by mouth daily., Disp: 90 capsule, Rfl: 3   fluticasone  (FLONASE ) 50 MCG/ACT nasal spray, Use 2 spray(s) in each nostril once daily, Disp: 16 g, Rfl: 5   ipratropium (ATROVENT) 0.03 % nasal spray, Place  2 sprays into both nostrils every 12 (twelve) hours., Disp: 30 mL, Rfl: 0   lamoTRIgine  (LAMICTAL ) 100 MG tablet, Take 1/2 tablet every morning, 1 tablet every evening, Disp: 135 tablet, Rfl: 3   rosuvastatin  (CRESTOR ) 20 MG tablet, Take 1 tablet (20 mg total) by mouth daily., Disp: 90 tablet, Rfl: 3  Current Facility-Administered Medications:    denosumab  (PROLIA ) injection 60 mg, 60 mg, Subcutaneous, Once, Romy Ipock M, DO Social History   Socioeconomic History   Marital status: Single    Spouse name: Not on file   Number of children: Not on file   Years of education: Not on file   Highest education level: GED or equivalent  Occupational History   Occupation: Disabled    Comment: Seizures  Tobacco Use   Smoking status: Former    Current packs/day: 0.00    Average packs/day: 1 pack/day for 5.0 years (5.0 ttl pk-yrs)    Types: Cigarettes    Start date: 01/10/2006    Quit date: 01/11/2011    Years since quitting: 13.5   Smokeless tobacco: Never  Vaping Use   Vaping status: Former  Substance and Sexual Activity   Alcohol use: No   Drug use: No   Sexual activity: Not Currently  Other Topics Concern   Not on file  Social History Narrative   Daily  Caffeine use - 3   Right hand   Living alone, 2nd floor w/ 20 steps   Social Drivers of Health   Financial Resource Strain: Medium Risk (04/12/2024)   Overall Financial Resource Strain (CARDIA)    Difficulty of Paying Living Expenses: Somewhat hard  Food Insecurity: No Food Insecurity (04/12/2024)   Hunger Vital Sign    Worried About Running Out of Food in the Last Year: Never true    Ran Out of Food in the Last Year: Never true  Recent Concern: Food Insecurity - Food Insecurity Present (03/25/2024)   Hunger Vital Sign    Worried About Running Out of Food in the Last Year: Sometimes true    Ran Out of Food in the Last Year: Never true  Transportation Needs: No Transportation Needs (04/12/2024)   PRAPARE - Therapist, art (Medical): No    Lack of Transportation (Non-Medical): No  Physical Activity: Inactive (04/12/2024)   Exercise Vital Sign    Days of Exercise per Week: 0 days    Minutes of Exercise per Session: Not on file  Stress: Stress Concern Present (04/12/2024)   [employer] of Occupational Health - Occupational Stress Questionnaire    Feeling of Stress: Rather much  Social Connections: Moderately Isolated (04/12/2024)   Social Connection and Isolation Panel    Frequency of Communication with Friends and Family: More than three times a week    Frequency of Social Gatherings with Friends and Family: Twice a week    Attends Religious Services: More than 4 times per year    Active Member of [employer] or Organizations: No    Attends Engineer, structural: Not on file    Marital Status: Divorced  Catering manager Violence: Not on file   Family History  Problem Relation Age of Onset   Heart disease Mother 57       No details about the type of heart disease.     Diabetes Mother    Breast cancer Paternal Aunt    Prostate cancer Other    Colon cancer Neg Hx     Objective: Office vital signs reviewed. BP 107/66   Pulse 81   Temp (!) 97.3 F (36.3 C)   Wt 149 lb 9.6 oz (67.9 kg)   SpO2 97%   BMI 24.89 kg/m   Physical Examination:  General: Awake, alert, nontoxic-appearing female, No acute distress HEENT: Normal    Neck: No masses palpated. No lymphadenopathy    Ears: Tympanic membranes intact, normal light reflex, no erythema, no bulging    Eyes: PERRLA, extraocular membranes intact, sclera white    Nose: nasal turbinates moist, clear nasal discharge    Throat: moist mucus membranes, no erythema, no tonsillar exudate.  Airway is patent Cardio: regular rate and rhythm, S1S2 heard, no murmurs appreciated Pulm: clear to auscultation bilaterally, no wheezes, rhonchi or rales; normal work of breathing on room air.  Coughing harshly intermittently and cough sounds  wet    Assessment/ Plan: 63 y.o. female   URI with cough and congestion - Plan: DG Chest 2 View, Veritor Flu A/B Waived, Novel Coronavirus, NAA ([facility]), benzonatate (TESSALON PERLES) 100 MG capsule, fluconazole  (DIFLUCAN ) 150 MG tablet, cefdinir  (OMNICEF ) 300 MG capsule   Given worsening of symptoms with brown sputum I am going to empirically cover for bacterial infection.  I reviewed her chest x-ray personally and cannot appreciate any acute pulmonary infiltrates to suggest pneumonia.  Tessalon Perles for cough.  Diflucan  sent  for as needed use.  She was screened for both flu and COVID.  Will contact with results once available   Norene CHRISTELLA Fielding, DO [facility] [phone]

## 2024-07-22 NOTE — Patient Instructions (Signed)
 Acute Bronchitis, Adult  Acute bronchitis is when air tubes in the lungs (bronchi) suddenly get swollen. The condition can make it hard for you to breathe. In adults, acute bronchitis usually goes away within 2 weeks. A cough caused by bronchitis may last up to 3 weeks. Smoking, allergies, and asthma can make the condition worse. What are the causes? Germs that cause cold and flu (viruses). The most common cause of this condition is the virus that causes the common cold. Bacteria. Substances that bother (irritate) the lungs, including: Smoke from cigarettes and other types of tobacco. Dust and pollen. Fumes from chemicals, gases, or burned fuel. Indoor or outdoor air pollution. What increases the risk? A weak body's defense system. This is also called the immune system. Any condition that affects your lungs and breathing, such as asthma. What are the signs or symptoms? A cough. Coughing up clear, yellow, or green mucus. Making high-pitched whistling sounds when you breathe, most often when you breathe out (wheezing). Runny or stuffy nose. Having too much mucus in your lungs (chest congestion). Shortness of breath. Body aches. A sore throat. How is this treated? Acute bronchitis may go away over time without treatment. Your doctor may tell you to: Drink more fluids. This will help thin your mucus so it is easier to cough up. Use a device that gets medicine into your lungs (inhaler). Use a vaporizer or a humidifier. These are machines that add water to the air. This helps with coughing and poor breathing. Take a medicine that thins mucus and helps clear it from your lungs. Take a medicine that prevents or stops coughing. It is not common to take an antibiotic medicine for this condition. Follow these instructions at home:  Take over-the-counter and prescription medicines only as told by your doctor. Use an inhaler, vaporizer, or humidifier as told by your doctor. Take two teaspoons  (10 mL) of honey at bedtime. This helps lessen your coughing at night. Drink enough fluid to keep your pee (urine) pale yellow. Do not smoke or use any products that contain nicotine or tobacco. If you need help quitting, ask your doctor. Get a lot of rest. Return to your normal activities when your doctor says that it is safe. Keep all follow-up visits. How is this prevented?  Wash your hands often with soap and water for at least 20 seconds. If you cannot use soap and water, use hand sanitizer. Avoid contact with people who have cold symptoms. Try not to touch your mouth, nose, or eyes with your hands. Avoid breathing in smoke or chemical fumes. Make sure to get the flu shot every year. Contact a doctor if: Your symptoms do not get better in 2 weeks. You have trouble coughing up the mucus. Your cough keeps you awake at night. You have a fever. Get help right away if: You cough up blood. You have chest pain. You have very bad shortness of breath. You faint or keep feeling like you are going to faint. You have a very bad headache. Your fever or chills get worse. These symptoms may be an emergency. Get help right away. Call your local emergency services (911 in the [place] Do not wait to see if the symptoms will go away. Do not drive yourself to the hospital. Summary Acute bronchitis is when air tubes in the lungs (bronchi) suddenly get swollen. In adults, acute bronchitis usually goes away within 2 weeks. Drink more fluids. This will help thin your mucus so it is easier  to cough up. Take over-the-counter and prescription medicines only as told by your doctor. Contact a doctor if your symptoms do not improve after 2 weeks of treatment. This information is not intended to replace advice given to you by your health care provider. Make sure you discuss any questions you have with your health care provider. Document Revised: 02/03/2021 Document Reviewed: 02/03/2021 Elsevier Patient  Education  2024 [employer].

## 2024-07-23 LAB — NOVEL CORONAVIRUS, NAA: SARS-CoV-2, NAA: NOT DETECTED

## 2024-07-23 NOTE — Telephone Encounter (Signed)
 Patient aware and verbalized understanding.

## 2024-08-09 ENCOUNTER — Ambulatory Visit: Admitting: *Deleted

## 2024-08-09 DIAGNOSIS — G40301 Generalized idiopathic epilepsy and epileptic syndromes, not intractable, with status epilepticus: Secondary | ICD-10-CM | POA: Diagnosis not present

## 2024-08-09 DIAGNOSIS — R292 Abnormal reflex: Secondary | ICD-10-CM

## 2024-08-09 NOTE — Progress Notes (Signed)
 Pt came in today to get hooked up for EEG she felt shaky and dizzy BP was checked it was 90/60 Dr Georjean was informed she advised to give her some water  and to recheck her after she is hooked up for the ambulatory EEG.

## 2024-08-09 NOTE — Progress Notes (Signed)
 Ambulatory EEG hooked up and running. Light flashing. Push button tested. Camera and event log explained. Batteries explained. Patient understood.

## 2024-08-09 NOTE — Progress Notes (Signed)
 Pt blood pressure rechecked 102/68. Pt advised to use her walker if she is still off balance so she will not fall, she will be back on Monday to have her EEG removed,

## 2024-08-13 ENCOUNTER — Other Ambulatory Visit: Payer: Self-pay

## 2024-08-14 NOTE — Progress Notes (Signed)
 AMB EEG discontinued.  Skin Breakdown:No Diary Returned: Yes no issue noted

## 2024-09-01 NOTE — Procedures (Signed)
 ELECTROENCEPHALOGRAM REPORT  Dates of Recording: 08/09/2024 11:31AM to 08/12/2024 5:40AM  Patient's Name: Savannah Shaw MRN: [id number] Date of Birth: Jun 09, 1961  Referring Provider: Dr. Darice Shivers  Procedure: 59:31-hour ambulatory video EEG  History: This is a 63 year old woman with a history of epilepsy since childhood, with recurrent episodes of losing track of conversations, myoclonic jerks. EEG for classification.   CNS Active Medications: Lamotrigine , Depakote , Carbamazepine   Technical Summary: This is a 59:31-hour multichannel digital video EEG recording measured by the international 10-20 system with electrodes applied with paste and impedances below 5000 ohms performed as portable with EKG monitoring.  The digital EEG was referentially recorded, reformatted, and digitally filtered in a variety of bipolar and referential montages for optimal display.    DESCRIPTION OF RECORDING: During maximal wakefulness, the background activity consisted of a symmetric 7 Hz posterior dominant rhythm which was reactive to eye opening.  There were no epileptiform discharges or focal slowing seen in wakefulness.  During the recording, the patient progresses through wakefulness, drowsiness, and Stage 2 sleep.  There were very rare generalized low to medium voltage spike and wave discharges seen in sleep.   Events: There were 13 push button events. Three had video where patient is sitting up on bed, talking, however audio is not working to hear patient's comments. No clinical changes seen on video.  Patient wrote symptoms on diary but did not note date or time. She reported lapse in memory, ?Shookd Droped?, Hands shaky, Hands shaky, Hands shaky over camera, Hands shaking especially right, Forgot words, Shaking bad when ?, Memory loss, Shaking bad  There were no electrographic seizures seen.  EKG lead was unremarkable.  IMPRESSION: This 59:31-hour ambulatory video EEG study is abnormal due to the  presence of: Slowing of the posterior dominant rhythm Very rare low to medium voltage generalized spike and wave discharges seen exclusively in sleep  CLINICAL CORRELATION of the above findings indicates diffuse cerebral dysfunction that is non-specific in etiology and can be seen with hypoxic/ischemic injury, toxic/metabolic encephalopathies, neurodegenerative disorders, or medication effect. 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 Push button events did not show any epileptiform correlate. If further clinical questions remain, inpatient video EEG monitoring may be helpful.   Darice Shivers, M.D.

## 2024-09-11 ENCOUNTER — Other Ambulatory Visit: Payer: Self-pay

## 2024-09-11 NOTE — Progress Notes (Signed)
 Patient has not received first dose sent, and has declined Prolia . Dis-enrolling.

## 2024-09-16 ENCOUNTER — Encounter: Payer: Self-pay | Admitting: Neurology

## 2024-09-16 ENCOUNTER — Ambulatory Visit: Admitting: Neurology

## 2024-09-16 VITALS — BP 135/81 | HR 83 | Ht 65.0 in | Wt 163.8 lb

## 2024-09-16 DIAGNOSIS — R251 Tremor, unspecified: Secondary | ICD-10-CM

## 2024-09-16 DIAGNOSIS — G40301 Generalized idiopathic epilepsy and epileptic syndromes, not intractable, with status epilepticus: Secondary | ICD-10-CM

## 2024-09-16 MED ORDER — DIVALPROEX SODIUM 500 MG PO DR TAB: 1000.0000 mg | DELAYED_RELEASE_TABLET | Freq: Two times a day (BID) | ORAL | 3 refills | Status: AC

## 2024-09-16 MED ORDER — CARBAMAZEPINE 200 MG PO TABS: ORAL_TABLET | ORAL | 3 refills | Status: AC

## 2024-09-16 MED ORDER — LAMOTRIGINE 100 MG PO TABS: ORAL_TABLET | ORAL | 3 refills | Status: AC

## 2024-09-16 NOTE — Progress Notes (Signed)
 NEUROLOGY FOLLOW UP OFFICE NOTE  Savannah Shaw [id number] [birth year]  Discussed the use of AI scribe software for clinical note transcription with the patient, who gave verbal consent to proceed.  History of Present Illness I had the pleasure of seeing Savannah Shaw in follow-up in the neurology clinic on 09/16/2024. She is again accompanied by her father who helps supplement the history today. The patient was last seen on 3 months ago for generalized epilepsy and ataxia that significantly improved with medication adjustment. She was noted to be hyperreflexic on exam. Records and images were personally reviewed where available.  I personally reviewed MRI brain with and without contrast done 06/2024 which did not show any acute changes, there was mild chronic microvascular disease and mild diffuse volume loss. Her father was reporting staring episodes, she reported daily episodes of losing train of thought. She had a 59-hour ambulatory EEG in 07/2024 which did not show any electrographic seizures, she reported tremors, forgot words, lapse in memory. Baseline EEG showed slowing of the posterior dominant rhythm and very rare low to medium voltage generalized spike and wave discharges seen exclusively in sleep. She is on Depakote  1000mg  BID (500mg : 2 tabs BID), Lamotrigine  50mg  in AM, 100mg  in PM (100mg  1/2 tab in AM, 1 tab in PM) and Carbamazepine  300mg  BID (200mg : 1.5 tabs BID) without significant issues. Her granddaughter and father feel she is doing well on her current regimen. She still experiences shakiness, particularly when eating, which she attributes to her medication. Her grandson reassures her that people are not watching her as she fears. She believes the shakiness worsened with Lamictal .  She experiences occasional lightheadedness, particularly when standing up. It resolves after sitting down, and she has not experienced any falls recently. She had a headache the previous day, which she attributes  to stress from caring for a family member with dementia, otherwise no significant headaches. Her sleep is generally okay, though she wakes up to use the bathroom.   History on Initial Assessment 06/05/2024: This is a 63 year old right-handed woman presenting for second opinion regarding her medication regimen. She has a history of seizures since childhood, she had frequent morning body jerks then had her first GTC around age 6. Records from her neurologist Dr. Onita were reviewed. She has been on seizure medication since then, initially on Phenobarbital, Depakote , then Phenobarbital was switched to Tegretol . Due to recurrent seizures, Zonisamide  was added. She was on a combination of Tegretol  300/200/300, Depakote  1000mg  BID, and Zonisamide  100mg  at bedtime for many years. On clinic visit 06/2023, she reported gradual onset bilateral hand tremors and leg twitching at night. MRI brain with and without contrast in 07/2023 no acute changes, there were a few small periventricular white matter changes. EEG showed mild background slowing. Depakote  level 82, Tegretol  level 9.6, Zonisamide  level 2.4. She had no GTCs for many years until possibly 09/2023 when her friend heard a loud noise in the middle of the night, she was difficult to arouse and had no recollection of events, very tired the next day. Her father also reported she would have slow reaction time to questions occurring a couple times a week. Lamotrigine  was added in 09/2023. She was weaned off Zonisamide  (history of kidney stone), then Tegretol  wean was started in 10/2023. After she tapered off Tegretol, at age 63  completely at the end of March, she reported hand tremors worsened. She reported this on clinic visit on 04/01/24, Depakote  was reduced to 750mg  BID and Lamotrigine  increased to 200mg  BID.  Two days later, she woke up with nausea and vomiting and went to the ER. She reported feeling very unsteady on her feet. Depakote  level 75, Lamotrigine  level 17. She was given a  tapering instruction for Depakote  and slower uptitration schedule for Lamotrigine . She has been scared to fully switch over and reprots that since 7/4, she has been taking Depakote  500mg  in AM, 750mg  in PM and Lamotrigine  150mg  in AM, 200mg  in PM.   The nausea and vomiting is better, however she strongly feels these are from her medications. She has prn Zofran  which helps but makes her wild, ornery, bossy. She reports last vomiting episode was a week ago but she thinks her last Zofran  dose was 3 weeks ago. Her cousin gave her an over the counter nausea medication which helps. She feels that on her current regimen, she gets headaches, feels swimmyheaded and sometimes sees double. The swimmyheadedness started in 09/2023 but has worsened. She has to sit until mid-day. It can occur 1-2 times a day lasting for an hour. She has fallen a couple of times, no loss of consciousness. Headaches occur once a week with pain in her temples through her face. She takes Tylenol  which helps.   Her father notices she stares a little bit, he notices it on a daily basis. She loses her train of thought a lot. She has body jerks quite often. He has noticed these even prior to medication changes. She states she was not having tremors prior to weaning off Tegretol , however notes indicate she reported it in 06/2023. The tremors affect her head, neck, legs. Sometimes she has to hold her right hand with her left to eat. Right tremor feels a little worse. Her writing is terrible. No family history of tremors. No alcohol. She usually sleeps okay. She lives alone. She has been using her father's walker for the past 3 weeks due to the falls and leg shaking. She does not drive.   Epilepsy Risk Factors: She had a normal birth and early development.  There is no history of febrile convulsions, CNS infections such as meningitis/encephalitis, significant traumatic brain injury, neurosurgical procedures, or family history of seizures.  Prior  ASMs: Phenobarbital, Zonisamide , Tegretol   Diagnostic Data: MRI brain with and without contrast done 06/2024 did not show any acute changes, there was mild chronic microvascular disease and mild diffuse volume loss.   59-hour ambulatory EEG in 07/2024 did not show any electrographic seizures, she reported tremors, forgot words, lapse in memory. Baseline EEG showed slowing of the posterior dominant rhythm and very rare low to medium voltage generalized spike and wave discharges seen exclusively in sleep.    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, controlled [facility]) neurologist-  dr onita ([facility])   last seizure 2008  (dx age 7)   History of acute pyelonephritis    w/ urosepsis 12-31-2015  resolved   History of kidney stones    Hyperlipidemia    Left ureteral stone    Normal exercise tolerance test results    12-24-2015 (dr hochrein)  no chest pain, no signficant arrhythmias , 85% max heartrate achieved    MEDICATIONS: Current Outpatient Medications on File Prior to Visit  Medication Sig Dispense Refill   carbamazepine  (TEGRETOL ) 200 MG tablet Take 1 and 1/2 tablets twice a day 270 tablet 3   cetirizine  (ZYRTEC ) 10 MG tablet TAKE 1 TABLET BY  MOUTH AT BEDTIME 90 tablet 1   divalproex  (DEPAKOTE ) 500 MG DR tablet Take 2 tablets (1,000 mg total) by mouth 2 (two) times daily. 360 tablet 3   Eluxadoline  (VIBERZI ) 100 MG TABS Take 1 tablet (100 mg total) by mouth 2 (two) times daily. 180 tablet 1   esomeprazole  (NEXIUM ) 40 MG capsule Take 1 capsule (40 mg total) by mouth daily. 90 capsule 3   fluticasone  (FLONASE ) 50 MCG/ACT nasal spray Use 2 spray(s) in each nostril once daily 16 g 5   lamoTRIgine  (LAMICTAL ) 100 MG tablet Take 1/2 tablet every morning, 1 tablet every evening 135 tablet 3   rosuvastatin  (CRESTOR ) 20 MG tablet Take 1 tablet (20 mg total) by mouth daily. 90 tablet 3    Current Facility-Administered Medications on File Prior to Visit  Medication Dose Route Frequency Provider Last Rate Last Admin   denosumab  (PROLIA ) injection 60 mg  60 mg Subcutaneous Once Gottschalk, Ashly M, DO        ALLERGIES: Allergies  Allergen Reactions   Terbinafine Hcl Rash    Lamasil   Topamax [Topiramate] Rash    FAMILY HISTORY: Family History  Problem Relation Age of Onset   Heart disease Mother 71       No details about the type of heart disease.     Diabetes Mother    Breast cancer Paternal Aunt    Prostate cancer Other    Colon cancer Neg Hx     SOCIAL HISTORY: Social History   Socioeconomic History   Marital status: Single    Spouse name: Not on file   Number of children: Not on file   Years of education: Not on file   Highest education level: GED or equivalent  Occupational History   Occupation: Disabled    Comment: Seizures  Tobacco Use   Smoking status: Former    Current packs/day: 0.00    Average packs/day: 1 pack/day for 5.0 years (5.0 ttl pk-yrs)    Types: Cigarettes    Start date: 01/10/2006    Quit date: 01/11/2011    Years since quitting: 13.6   Smokeless tobacco: Never  Vaping Use   Vaping status: Former  Substance and Sexual Activity   Alcohol use: No   Drug use: No   Sexual activity: Not Currently  Other Topics Concern   Not on file  Social History Narrative   Daily Caffeine use - 3   Right hand   Living alone, 2nd floor w/ 20 steps   Social Drivers of Health   Financial Resource Strain: Medium Risk (04/12/2024)   Overall Financial Resource Strain (CARDIA)    Difficulty of Paying Living Expenses: Somewhat hard  Food Insecurity: No Food Insecurity (04/12/2024)   Hunger Vital Sign    Worried About Running Out of Food in the Last Year: Never true    Ran Out of Food in the Last Year: Never true  Recent Concern: Food Insecurity - Food Insecurity Present (03/25/2024)   Hunger Vital Sign    Worried About Running Out of Food in  the Last Year: Sometimes true    Ran Out of Food in the Last Year: Never true  Transportation Needs: No Transportation Needs (04/12/2024)   PRAPARE - Administrator, Civil Service (Medical): No    Lack of Transportation (Non-Medical): No  Physical Activity: Inactive (04/12/2024)   Exercise Vital Sign    Days of Exercise per Week: 0 days    Minutes of Exercise per Session: Not  on file  Stress: Stress Concern Present (04/12/2024)   [employer] of Occupational Health - Occupational Stress Questionnaire    Feeling of Stress: Rather much  Social Connections: Moderately Isolated (04/12/2024)   Social Connection and Isolation Panel    Frequency of Communication with Friends and Family: More than three times a week    Frequency of Social Gatherings with Friends and Family: Twice a week    Attends Religious Services: More than 4 times per year    Active Member of [employer] or Organizations: No    Attends Engineer, Structural: Not on file    Marital Status: Divorced  Intimate Partner Violence: Not on file     PHYSICAL EXAM: Vitals:   09/16/24 0936  BP: 135/81  Pulse: 83  SpO2: 99%   General: No acute distress Head:  Normocephalic/atraumatic Skin/Extremities: No rash, no edema Neurological Exam: alert and awake. No aphasia or dysarthria. Fund of knowledge is appropriate. Attention and concentration are normal.   Cranial nerves: Pupils equal, round. Extraocular movements intact with no nystagmus. Visual fields full.  No facial asymmetry.  Motor: Bulk and tone normal, muscle strength 5/5 throughout with no pronator drift.   Finger to nose testing intact.  Gait narrow-based and steady, no ataxia. Romberg negative. No postural or head tremor today. Minimal left endpoint tremor.  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 MRI brain no acute changes. Medications were adjusted, she is on a lower dose of Lamotrigine  and back on Tegretol  and feels much better. Ataxia has resolved. No significant tremors today, she would like to streamline medications and slowly reduce Lamotrigine  to 50mg  BID (100mg  1/2 tab BID). We discussed Depakote  is the medication that usually causes tremor, continue to monitor as medication changes underway. Continue Depakote  1000mg  BID (500mg  2 tabs BID) and Carbamazepine  300mg  BID (200mg  1.5 tabs BID). We discussed the lightheadedness, monitor BP and increase hydration. She does not drive. Follow-up in 3-4 months, call for any changes.      Thank you for allowing me to participate in her care.  Please do not hesitate to call for any questions or concerns.    Darice Shivers, M.D.   CC: Dr. Jolinda

## 2024-09-16 NOTE — Patient Instructions (Addendum)
 Good to see you doing better!   We can reduce the Lamotrigine  100mg : take 1/2 tablet every morning, 1/2 tablet every night  2. Continue Depakote  and Carbamazepine  as prescribed  3. Follow-up in 3-4 months, call for any changes   Seizure Precautions: 1. If medication has been prescribed for you to prevent seizures, take it exactly as directed.  Do not stop taking the medicine without talking to your doctor first, even if you have not had a seizure in a long time.   2. Avoid activities in which a seizure would cause danger to yourself or to others.  Don't operate dangerous machinery, swim alone, or climb in high or dangerous places, such as on ladders, roofs, or girders.  Do not drive unless your doctor says you may.  3. If you have any warning that you may have a seizure, lay down in a safe place where you can't hurt yourself.    4.  No driving for 6 months from last seizure, as per [place]  state law.   Please refer to the following link on the Epilepsy Foundation of America's website for more information: http://www.epilepsyfoundation.org/answerplace/Social/driving/drivingu.cfm   5.  Maintain good sleep hygiene.  6.  Contact your doctor if you have any problems that may be related to the medicine you are taking.  7.  Call 911 and bring the patient back to the ED if:        A.  The seizure lasts longer than 5 minutes.       B.  The patient doesn't awaken shortly after the seizure  C.  The patient has new problems such as difficulty seeing, speaking or moving  D.  The patient was injured during the seizure  E.  The patient has a temperature over 102 F (39C)  F.  The patient vomited and now is having trouble breathing

## 2024-09-20 ENCOUNTER — Other Ambulatory Visit: Payer: Self-pay | Admitting: Nurse Practitioner

## 2024-09-20 DIAGNOSIS — J301 Allergic rhinitis due to pollen: Secondary | ICD-10-CM

## 2024-09-23 ENCOUNTER — Telehealth: Payer: Self-pay | Admitting: Neurology

## 2024-09-23 NOTE — Telephone Encounter (Signed)
 Patient stated will be seeing another neurologist, will no longer come to y'all.

## 2024-09-23 NOTE — Telephone Encounter (Signed)
 noted

## 2024-09-30 ENCOUNTER — Ambulatory Visit: Admitting: Neurology

## 2024-10-24 ENCOUNTER — Other Ambulatory Visit: Payer: Self-pay | Admitting: Family Medicine

## 2024-10-24 DIAGNOSIS — K580 Irritable bowel syndrome with diarrhea: Secondary | ICD-10-CM

## 2024-10-28 ENCOUNTER — Other Ambulatory Visit: Payer: Self-pay | Admitting: Family Medicine

## 2024-10-28 ENCOUNTER — Ambulatory Visit: Payer: Self-pay | Admitting: Family Medicine

## 2024-10-28 DIAGNOSIS — K580 Irritable bowel syndrome with diarrhea: Secondary | ICD-10-CM

## 2024-10-28 NOTE — Telephone Encounter (Signed)
 FYI Only or Action Required?: Action required by provider: medication refill request.  Patient was last seen in primary care on 07/22/2024 by Jolinda Norene HERO, DO.  Called Nurse Triage reporting Medication Refill.  Symptoms began N/A.  Interventions attempted: Other: N/A.  Symptoms are: N/A.  Triage Disposition: Call PCP Now (overriding Call PCP When Office is Open)  Patient/caregiver understands and will follow disposition?: Yes               Message from Graeme ORN sent at 10/28/2024 11:05 AM EST  Summary: Stomach - unable to eat, keep anything down.   Reason for Triage: Stomach - unable to eat, keep anything down.  Called medication into pharmacy Thursday and it was denied due to needs appt. Patient now does not have any medication and unable to eat without it. Thank You          Reason for Disposition  Caller requesting a CONTROLLED substance prescription refill (e.g., narcotics, ADHD medicines)  Answer Assessment - Initial Assessment Questions 1. DRUG NAME: What medicine do you need to have refilled?     Viberzi .  2. REFILLS REMAINING: How many refills are remaining? Notes: The label on the medicine or pill bottle will show how many refills are remaining. If there are no refills remaining, then a renewal may be needed.     0.  3. EXPIRATION DATE: What is the expiration date? Note: The label states when the prescription will expire, and thus can no longer be refilled.)     09/25/24.  4. PRESCRIBER: Who prescribed it? Note: The prescribing doctor or group is responsible for refill approvals..     Dr Jolinda.  5. PHARMACY: Have you contacted your pharmacy (drugstore)? Note: Some pharmacies will contact the doctor (or NP/PA).      N/A  6. SYMPTOMS: Do you have any symptoms?     Asymptomatic. She is avoiding eating or she will have diarrhea. Last dose was yesterday. If she eats without it the food runs right through me. No blood in stool or  vomiting.  Protocols used: Medication Refill and Renewal Call-A-AH

## 2024-10-28 NOTE — Telephone Encounter (Signed)
 Appointment scheduled 11/01/2024 with Dr. Jolinda

## 2024-10-28 NOTE — Telephone Encounter (Signed)
 Patient has a voice been every 6 months follow-up for this medication as it is a controlled substance.  She has to be seen in a face-to-face visit and if does not have updated UDS and CSC that we will have to be updated as well.  This is not new

## 2024-10-29 ENCOUNTER — Ambulatory Visit

## 2024-11-01 ENCOUNTER — Encounter: Payer: Self-pay | Admitting: Family Medicine

## 2024-11-01 ENCOUNTER — Ambulatory Visit: Admitting: Family Medicine

## 2024-11-01 VITALS — BP 130/77 | HR 76 | Temp 97.5°F | Ht 65.0 in | Wt 157.5 lb

## 2024-11-01 DIAGNOSIS — J301 Allergic rhinitis due to pollen: Secondary | ICD-10-CM | POA: Diagnosis not present

## 2024-11-01 DIAGNOSIS — G40909 Epilepsy, unspecified, not intractable, without status epilepticus: Secondary | ICD-10-CM | POA: Diagnosis not present

## 2024-11-01 DIAGNOSIS — K219 Gastro-esophageal reflux disease without esophagitis: Secondary | ICD-10-CM

## 2024-11-01 DIAGNOSIS — K580 Irritable bowel syndrome with diarrhea: Secondary | ICD-10-CM | POA: Diagnosis not present

## 2024-11-01 DIAGNOSIS — Z23 Encounter for immunization: Secondary | ICD-10-CM

## 2024-11-01 MED ORDER — VIBERZI 100 MG PO TABS: 1.0000 | ORAL_TABLET | Freq: Two times a day (BID) | ORAL | 1 refills | Status: AC

## 2024-11-01 MED ORDER — FLUTICASONE PROPIONATE 50 MCG/ACT NA SUSP: 2.0000 | Freq: Every day | NASAL | 5 refills | Status: AC

## 2024-11-01 MED ORDER — PANTOPRAZOLE SODIUM 40 MG PO TBEC: 40.0000 mg | DELAYED_RELEASE_TABLET | Freq: Every day | ORAL | 3 refills | Status: AC

## 2024-11-01 NOTE — Progress Notes (Signed)
 "  Subjective: CC: Follow-up IBS-D PCP: Savannah Norene HERO, DO YEP:[name] is a 64 y.o. female presenting to clinic today for:  Patient here for interval follow-up of her irritable bowel syndrome with diarrhea.  She notes that she ran out of her Viberzi  several days ago and has been trying utilize Imodium in efforts to be able to eat because if she eats she will absolutely have diarrhea and has had several episodes since her last visit.  There is no blood in stool.  She reports no nausea or vomiting but does report uncontrolled acid reflux despite compliance with Nexium  40 mg daily.  She has not yet completed the Cologuard but has it at home and will return ASAP.  Agreeable to pneumonia shot today.  Does not want to have Pap done ever again.   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, controlled [facility]) neurologist-  dr onita ([facility])   last seizure 2008  (dx age 39)   History of acute pyelonephritis    w/ urosepsis 12-31-2015  resolved   History of kidney stones    Hyperlipidemia    Left ureteral stone    Normal exercise tolerance test results    12-24-2015 (dr hochrein)  no chest pain, no signficant arrhythmias , 85% max heartrate achieved   Current Medications[2] Social History   Socioeconomic History   Marital status: Single    Spouse name: Not on file   Number of children: Not on file   Years of education: Not on file   Highest education level: GED or equivalent  Occupational History   Occupation: Disabled    Comment: Seizures  Tobacco Use   Smoking status: Former    Current packs/day: 0.00    Average packs/day: 1 pack/day for 5.0 years (5.0 ttl pk-yrs)    Types: Cigarettes    Start date: 01/10/2006    Quit date: 01/11/2011    Years since quitting: 13.8   Smokeless tobacco: Never  Vaping Use   Vaping status: Former  Substance and Sexual Activity    Alcohol use: No   Drug use: No   Sexual activity: Not Currently  Other Topics Concern   Not on file  Social History Narrative   Daily Caffeine use - 3   Right hand   Living alone, 2nd floor w/ 20 steps   Social Drivers of Health   Tobacco Use: Medium Risk (09/16/2024)   Patient History    Smoking Tobacco Use: Former    Smokeless Tobacco Use: Never    Passive Exposure: Not on Actuary Strain: Low Risk (10/28/2024)   Overall Financial Resource Strain (CARDIA)    Difficulty of Paying Living Expenses: Not hard at all  Food Insecurity: No Food Insecurity (10/28/2024)   Epic    Worried About Radiation Protection Practitioner of Food in the Last Year: Never true    Ran Out of Food in the Last Year: Never true  Transportation Needs: No Transportation Needs (10/28/2024)   Epic    Lack of Transportation (Medical): No    Lack of Transportation (Non-Medical): No  Physical Activity: Insufficiently Active (10/28/2024)   Exercise Vital Sign    Days of Exercise per Week: 1 day    Minutes of Exercise per Session: 10 min  Stress: No Stress Concern Present (10/28/2024)   [employer] of Occupational Health - Occupational Stress Questionnaire  Feeling of Stress: Not at all  Social Connections: Moderately Isolated (10/28/2024)   Social Connection and Isolation Panel    Frequency of Communication with Friends and Family: More than three times a week    Frequency of Social Gatherings with Friends and Family: Twice a week    Attends Religious Services: More than 4 times per year    Active Member of [employer] or Organizations: No    Attends Engineer, Structural: Not on file    Marital Status: Divorced  Intimate Partner Violence: Not on file  Depression (PHQ2-9): Medium Risk (04/16/2024)   Depression (PHQ2-9)    PHQ-2 Score: 6  Alcohol Screen: Low Risk (10/28/2024)   Alcohol Screen    Last Alcohol Screening Score (AUDIT): 1  Housing: Low Risk (10/28/2024)   Epic    Unable to Pay for Housing in  the Last Year: No    Number of Times Moved in the Last Year: 0    Homeless in the Last Year: No  Utilities: Not on file  Health Literacy: Not on file   Family History  Problem Relation Age of Onset   Heart disease Mother 77       No details about the type of heart disease.     Diabetes Mother    Breast cancer Paternal Aunt    Prostate cancer Other    Colon cancer Neg Hx     Objective: Office vital signs reviewed. BP 130/77   Pulse 76   Temp (!) 97.5 F (36.4 C)   Ht 5' 5 (1.651 m)   Wt 157 lb 8 oz (71.4 kg)   SpO2 100%   BMI 26.21 kg/m   Physical Examination:  General: Awake, alert, well nourished, No acute distress HEENT: Sclera white.  Moist mucous membranes Cardio: regular rate and rhythm, S1S2 heard, no murmurs appreciated Pulm: clear to auscultation bilaterally, no wheezes, rhonchi or rales; normal work of breathing on room air GI: soft, mild generalized tenderness to palpation.  Non-distended, bowel sounds present x4, no hepatomegaly, no splenomegaly, no masses Neuro: Mild hand tremor appreciated    Assessment/ Plan: 64 y.o. female   Irritable bowel syndrome with diarrhea - Plan: Eluxadoline  (VIBERZI ) 100 MG TABS, CMP14+EGFR, CBC with Differential  Gastroesophageal reflux disease without esophagitis - Plan: pantoprazole  (PROTONIX ) 40 MG tablet  Non-seasonal allergic rhinitis due to pollen - Plan: fluticasone  (FLONASE ) 50 MCG/ACT nasal spray  Seizure disorder (HCC) - Plan: CMP14+EGFR, CBC with Differential  Immunization due - Plan: Pneumococcal conjugate vaccine 20-valent (Prevnar 20)   Up-to-date on UDS and CSC and the national narcotic database reviewed with no red flags.  Viberzi  renewed.  PPI changed to Protonix .  Flonase  renewed that we did not discuss allergic rhinitis today.  Will check CMP and CBC for completion though not sure if she actually needs interval labs for her seizure medicines, which are working very well at current dosages now.   Pneumococcal vaccination administered  Follow-up in 6 months for annual physical fasting labs, sooner if concerns arise   Savannah Roscoe CHRISTELLA Fielding, DO [facility] [phone]     [1]  Allergies Allergen Reactions   Terbinafine Hcl Rash    Lamasil   Topamax [Topiramate] Rash  [2]  Current Outpatient Medications:    carbamazepine  (TEGRETOL ) 200 MG tablet, Take 1 and 1/2 tablets twice a day, Disp: 270 tablet, Rfl: 3   cetirizine  (ZYRTEC ) 10 MG tablet, TAKE 1 TABLET BY MOUTH AT BEDTIME, Disp: 90 tablet, Rfl: 0  divalproex  (DEPAKOTE ) 500 MG DR tablet, Take 2 tablets (1,000 mg total) by mouth 2 (two) times daily., Disp: 360 tablet, Rfl: 3   Eluxadoline  (VIBERZI ) 100 MG TABS, Take 1 tablet (100 mg total) by mouth 2 (two) times daily., Disp: 180 tablet, Rfl: 1   esomeprazole  (NEXIUM ) 40 MG capsule, Take 1 capsule (40 mg total) by mouth daily., Disp: 90 capsule, Rfl: 3   fluticasone  (FLONASE ) 50 MCG/ACT nasal spray, Use 2 spray(s) in each nostril once daily, Disp: 16 g, Rfl: 5   lamoTRIgine  (LAMICTAL ) 100 MG tablet, Take 1/2 tablet every morning, 1/2 tablet every evening, Disp: 90 tablet, Rfl: 3   rosuvastatin  (CRESTOR ) 20 MG tablet, Take 1 tablet (20 mg total) by mouth daily., Disp: 90 tablet, Rfl: 3  Current Facility-Administered Medications:    denosumab  (PROLIA ) injection 60 mg, 60 mg, Subcutaneous, Once, Audris Speaker M, DO  "

## 2024-11-02 LAB — CBC WITH DIFFERENTIAL/PLATELET
Basophils Absolute: 0 x10E3/uL (ref 0.0–0.2)
Basos: 1 %
EOS (ABSOLUTE): 0.1 x10E3/uL (ref 0.0–0.4)
Eos: 1 %
Hematocrit: 41.4 % (ref 34.0–46.6)
Hemoglobin: 14.1 g/dL (ref 11.1–15.9)
Immature Grans (Abs): 0 x10E3/uL (ref 0.0–0.1)
Immature Granulocytes: 0 %
Lymphocytes Absolute: 2.6 x10E3/uL (ref 0.7–3.1)
Lymphs: 54 %
MCH: 34 pg — ABNORMAL HIGH (ref 26.6–33.0)
MCHC: 34.1 g/dL (ref 31.5–35.7)
MCV: 100 fL — ABNORMAL HIGH (ref 79–97)
Monocytes Absolute: 0.4 x10E3/uL (ref 0.1–0.9)
Monocytes: 9 %
Neutrophils Absolute: 1.7 x10E3/uL (ref 1.4–7.0)
Neutrophils: 35 %
Platelets: 240 x10E3/uL (ref 150–450)
RBC: 4.15 x10E6/uL (ref 3.77–5.28)
RDW: 12.7 % (ref 11.7–15.4)
WBC: 4.9 x10E3/uL (ref 3.4–10.8)

## 2024-11-02 LAB — CMP14+EGFR
ALT: 8 IU/L (ref 0–32)
AST: 22 IU/L (ref 0–40)
Albumin: 4.2 g/dL (ref 3.9–4.9)
Alkaline Phosphatase: 70 IU/L (ref 49–135)
BUN/Creatinine Ratio: 32 — ABNORMAL HIGH (ref 12–28)
BUN: 21 mg/dL (ref 8–27)
Bilirubin Total: <0.2 mg/dL (ref 0.0–1.2)
CO2: 23 mmol/L (ref 20–29)
Calcium: 9.3 mg/dL (ref 8.7–10.3)
Chloride: 104 mmol/L (ref 96–106)
Creatinine, Ser: 0.65 mg/dL (ref 0.57–1.00)
Globulin, Total: 2.5 g/dL (ref 1.5–4.5)
Glucose: 93 mg/dL (ref 70–99)
Potassium: 4.6 mmol/L (ref 3.5–5.2)
Sodium: 143 mmol/L (ref 134–144)
Total Protein: 6.7 g/dL (ref 6.0–8.5)
eGFR: 99 mL/min/1.73

## 2024-11-04 ENCOUNTER — Ambulatory Visit: Payer: Self-pay | Admitting: Family Medicine

## 2024-11-19 ENCOUNTER — Encounter: Payer: Self-pay | Admitting: Family Medicine

## 2024-11-19 ENCOUNTER — Telehealth: Admitting: Physician Assistant

## 2024-11-19 DIAGNOSIS — K0889 Other specified disorders of teeth and supporting structures: Secondary | ICD-10-CM

## 2024-11-19 NOTE — Telephone Encounter (Signed)
 Needs to be seen

## 2024-11-20 ENCOUNTER — Ambulatory Visit

## 2024-12-25 ENCOUNTER — Ambulatory Visit: Admitting: Neurology

## 2025-05-02 ENCOUNTER — Ambulatory Visit: Admitting: Family Medicine
